# Patient Record
Sex: Female | Born: 1975
Health system: Southern US, Community
[De-identification: ages and names within clinical notes are randomized; demographics above are authoritative.]

## PROBLEM LIST (undated history)

## (undated) DIAGNOSIS — I499 Cardiac arrhythmia, unspecified: Secondary | ICD-10-CM

## (undated) DIAGNOSIS — Z992 Dependence on renal dialysis: Secondary | ICD-10-CM

## (undated) DIAGNOSIS — F32A Depression, unspecified: Secondary | ICD-10-CM

## (undated) DIAGNOSIS — K279 Peptic ulcer, site unspecified, unspecified as acute or chronic, without hemorrhage or perforation: Secondary | ICD-10-CM

## (undated) DIAGNOSIS — B373 Candidiasis of vulva and vagina: Secondary | ICD-10-CM

## (undated) DIAGNOSIS — E785 Hyperlipidemia, unspecified: Secondary | ICD-10-CM

## (undated) DIAGNOSIS — F29 Unspecified psychosis not due to a substance or known physiological condition: Secondary | ICD-10-CM

## (undated) DIAGNOSIS — K863 Pseudocyst of pancreas: Secondary | ICD-10-CM

## (undated) DIAGNOSIS — Z9889 Other specified postprocedural states: Secondary | ICD-10-CM

## (undated) DIAGNOSIS — G8929 Other chronic pain: Secondary | ICD-10-CM

## (undated) DIAGNOSIS — N907 Vulvar cyst: Secondary | ICD-10-CM

## (undated) DIAGNOSIS — N83209 Unspecified ovarian cyst, unspecified side: Secondary | ICD-10-CM

## (undated) DIAGNOSIS — F329 Major depressive disorder, single episode, unspecified: Secondary | ICD-10-CM

## (undated) DIAGNOSIS — F411 Generalized anxiety disorder: Secondary | ICD-10-CM

## (undated) DIAGNOSIS — F419 Anxiety disorder, unspecified: Secondary | ICD-10-CM

## (undated) DIAGNOSIS — M79606 Pain in leg, unspecified: Secondary | ICD-10-CM

## (undated) DIAGNOSIS — E669 Obesity, unspecified: Secondary | ICD-10-CM

## (undated) DIAGNOSIS — R109 Unspecified abdominal pain: Secondary | ICD-10-CM

## (undated) DIAGNOSIS — IMO0001 Reserved for inherently not codable concepts without codable children: Secondary | ICD-10-CM

## (undated) DIAGNOSIS — N189 Chronic kidney disease, unspecified: Secondary | ICD-10-CM

## (undated) DIAGNOSIS — K219 Gastro-esophageal reflux disease without esophagitis: Secondary | ICD-10-CM

## (undated) DIAGNOSIS — U071 COVID-19: Secondary | ICD-10-CM

## (undated) DIAGNOSIS — D631 Anemia in chronic kidney disease: Secondary | ICD-10-CM

## (undated) DIAGNOSIS — R319 Hematuria, unspecified: Secondary | ICD-10-CM

## (undated) DIAGNOSIS — I10 Essential (primary) hypertension: Secondary | ICD-10-CM

## (undated) DIAGNOSIS — Z8674 Personal history of sudden cardiac arrest: Secondary | ICD-10-CM

## (undated) DIAGNOSIS — N289 Disorder of kidney and ureter, unspecified: Secondary | ICD-10-CM

## (undated) DIAGNOSIS — N939 Abnormal uterine and vaginal bleeding, unspecified: Secondary | ICD-10-CM

## (undated) DIAGNOSIS — M109 Gout, unspecified: Secondary | ICD-10-CM

## (undated) DIAGNOSIS — R000 Tachycardia, unspecified: Secondary | ICD-10-CM

## (undated) DIAGNOSIS — E7800 Pure hypercholesterolemia, unspecified: Secondary | ICD-10-CM

## (undated) DIAGNOSIS — K859 Acute pancreatitis without necrosis or infection, unspecified: Secondary | ICD-10-CM

## (undated) HISTORY — DX: Peptic ulcer, site unspecified, unspecified as acute or chronic, without hemorrhage or perforation: K27.9

## (undated) HISTORY — DX: Anxiety disorder, unspecified: F41.9

## (undated) HISTORY — DX: Pain in leg, unspecified: M79.606

## (undated) HISTORY — DX: Depression, unspecified: F32.A

## (undated) HISTORY — PX: TUBAL LIGATION: SHX77

## (undated) HISTORY — DX: Other chronic pain: G89.29

## (undated) HISTORY — DX: Candidiasis of vulva and vagina: B37.3

## (undated) HISTORY — DX: Unspecified abdominal pain: R10.9

## (undated) HISTORY — DX: Other specified postprocedural states: Z98.890

## (undated) HISTORY — DX: Vulvar cyst: N90.7

## (undated) HISTORY — DX: Obesity, unspecified: E66.9

## (undated) HISTORY — DX: Reserved for inherently not codable concepts without codable children: IMO0001

## (undated) HISTORY — DX: Essential (primary) hypertension: I10

## (undated) HISTORY — DX: Hematuria, unspecified: R31.9

## (undated) HISTORY — DX: Major depressive disorder, single episode, unspecified: F32.9

## (undated) HISTORY — DX: Unspecified ovarian cyst, unspecified side: N83.209

## (undated) HISTORY — DX: Gastro-esophageal reflux disease without esophagitis: K21.9

## (undated) HISTORY — DX: Abnormal uterine and vaginal bleeding, unspecified: N93.9

---

## 2001-11-20 ENCOUNTER — Other Ambulatory Visit: Admission: RE | Admit: 2001-11-20 | Discharge: 2001-11-20 | Payer: Self-pay | Admitting: Obstetrics and Gynecology

## 2001-11-21 ENCOUNTER — Ambulatory Visit (HOSPITAL_COMMUNITY): Admission: RE | Admit: 2001-11-21 | Discharge: 2001-11-21 | Payer: Self-pay | Admitting: Obstetrics and Gynecology

## 2008-02-03 ENCOUNTER — Emergency Department (HOSPITAL_COMMUNITY): Admission: EM | Admit: 2008-02-03 | Discharge: 2008-02-03 | Payer: Self-pay | Admitting: Emergency Medicine

## 2008-03-18 ENCOUNTER — Emergency Department (HOSPITAL_COMMUNITY): Admission: EM | Admit: 2008-03-18 | Discharge: 2008-03-18 | Payer: Self-pay | Admitting: Emergency Medicine

## 2008-04-01 ENCOUNTER — Other Ambulatory Visit: Admission: RE | Admit: 2008-04-01 | Discharge: 2008-04-01 | Payer: Self-pay | Admitting: Obstetrics and Gynecology

## 2008-05-21 ENCOUNTER — Emergency Department (HOSPITAL_COMMUNITY): Admission: EM | Admit: 2008-05-21 | Discharge: 2008-05-21 | Payer: Self-pay | Admitting: Emergency Medicine

## 2008-09-13 ENCOUNTER — Emergency Department (HOSPITAL_COMMUNITY): Admission: EM | Admit: 2008-09-13 | Discharge: 2008-09-13 | Payer: Self-pay | Admitting: Emergency Medicine

## 2008-11-18 ENCOUNTER — Encounter: Payer: Self-pay | Admitting: Obstetrics and Gynecology

## 2008-11-18 ENCOUNTER — Ambulatory Visit (HOSPITAL_COMMUNITY): Admission: RE | Admit: 2008-11-18 | Discharge: 2008-11-18 | Payer: Self-pay | Admitting: Obstetrics and Gynecology

## 2009-05-07 ENCOUNTER — Emergency Department (HOSPITAL_COMMUNITY): Admission: EM | Admit: 2009-05-07 | Discharge: 2009-05-07 | Payer: Self-pay | Admitting: Emergency Medicine

## 2010-01-14 ENCOUNTER — Emergency Department (HOSPITAL_COMMUNITY): Admission: EM | Admit: 2010-01-14 | Discharge: 2010-01-15 | Payer: Self-pay | Admitting: Emergency Medicine

## 2010-07-22 ENCOUNTER — Other Ambulatory Visit: Admission: RE | Admit: 2010-07-22 | Discharge: 2010-07-22 | Payer: Self-pay | Admitting: Obstetrics and Gynecology

## 2010-09-28 DIAGNOSIS — IMO0002 Reserved for concepts with insufficient information to code with codable children: Secondary | ICD-10-CM | POA: Insufficient documentation

## 2010-10-02 ENCOUNTER — Ambulatory Visit: Payer: Self-pay | Admitting: Internal Medicine

## 2010-10-05 ENCOUNTER — Ambulatory Visit: Payer: Self-pay | Admitting: Internal Medicine

## 2010-10-05 DIAGNOSIS — R109 Unspecified abdominal pain: Secondary | ICD-10-CM | POA: Insufficient documentation

## 2010-10-22 ENCOUNTER — Ambulatory Visit (HOSPITAL_COMMUNITY): Admission: RE | Admit: 2010-10-22 | Discharge: 2010-10-22 | Payer: Self-pay | Admitting: Internal Medicine

## 2010-10-22 ENCOUNTER — Ambulatory Visit: Payer: Self-pay | Admitting: Internal Medicine

## 2010-10-27 ENCOUNTER — Ambulatory Visit (HOSPITAL_COMMUNITY): Admission: RE | Admit: 2010-10-27 | Discharge: 2010-10-27 | Payer: Self-pay | Admitting: Internal Medicine

## 2010-10-28 ENCOUNTER — Encounter (INDEPENDENT_AMBULATORY_CARE_PROVIDER_SITE_OTHER): Payer: Self-pay

## 2010-10-29 ENCOUNTER — Encounter: Payer: Self-pay | Admitting: Internal Medicine

## 2010-11-02 ENCOUNTER — Encounter (HOSPITAL_COMMUNITY)
Admission: RE | Admit: 2010-11-02 | Discharge: 2010-12-02 | Payer: Self-pay | Source: Home / Self Care | Admitting: Internal Medicine

## 2010-11-17 ENCOUNTER — Ambulatory Visit: Payer: Self-pay | Admitting: Internal Medicine

## 2011-01-17 ENCOUNTER — Encounter: Payer: Self-pay | Admitting: Obstetrics and Gynecology

## 2011-01-26 NOTE — Letter (Signed)
Summary: EGD ORDER  EGD ORDER   Imported By: Sofie Rower 10/02/2010 16:03:53  _____________________________________________________________________  External Attachment:    Type:   Image     Comment:   External Document

## 2011-01-26 NOTE — Assessment & Plan Note (Signed)
Summary: ABD PAIN/SS   Visit Type:  Initial Consult Referring Provider:  Luan Pulling Primary Care Provider:  Luan Pulling  Chief Complaint:  abd pain.  History of Present Illness: 35 year old morbidly obese African American female sent over out of the courtesy of  Dr. Luan Pulling to further evaluate insidiously  worsening abdominal pain. This lady has a history of peptic ulcer disease.  She has a history of peptic ulcer disease diagnosed at EGD about 10 yearts ago.  She was taking significant nonsteroidals at that time. Biopsies were negative for H. pylori/malignancy. Followup EGD demonstrated healing. Her CLOtest was also negative. HP serologies were also negative. More recently  family tells me she has been taking Goody powders but that behavior stopped about one month ago. She has not been taking any other nonsteroidals as for family knows. No dysphagia no odynophagia. She has a history of chronic GERD well-controlled on Prilosec 20 mg orally daily. She describes her pain  as epigastric  and occurs more often in the evening within several hours after eating before go into bed. Last an hour or so. Not associated with nausea or vomiting. She is rarely constipated. She denies melena or rectal bleeding. Father has a history of having a serrated adenoma removed from his colon prior to the age of 8. There is no family history of colon cancer. Her gallbladder remains in situ. She underwent  a tubal ligation since she was last seen here.   Current Medications (verified): 1)  Amlodipine Besy-Benazepril Hcl 10-20 Mg Caps (Amlodipine Besy-Benazepril Hcl) .... Take 1 Tablet By Mouth Once A Day 2)  Metoprolol Tartrate 100 Mg Tabs (Metoprolol Tartrate) .... Take 1 Tablet By Mouth Two Times A Day 3)  Hydrochlorothiazide 25 Mg Tabs (Hydrochlorothiazide) .... 1/2 Tablet Twice Daily 4)  Alprazolam 0.5 Mg Tabs (Alprazolam) .... Take 1 Tablet By Mouth Two Times A Day 5)  Prilosec 20 Mg Cpdr (Omeprazole) .... Take 1 Tablet By  Mouth Once A Day 6)  Lactulose Syrup .... As Directed As Needed 7)  Hydrocodone 5/325 Mg .... Take 1 Tablet By Mouth Four Times A Day As Needed  Allergies (verified): No Known Drug Allergies  Past History:  Family History: Last updated: 10/02/2010 Father: Living age 51   DM  hypertension Mother: Living age 6  Seizures/ hypertension Siblings: One brother   healthy  Social History: Last updated: 10/02/2010 Marital Status: Single Children: none Occupation: Disability  Past Medical History: Hypertension Anxiety and depression Leg pain Reflux  Past Surgical History: Hysterectomy  Family History: Father: Living age 73   DM  hypertension Mother: Living age 22  Seizures/ hypertension Siblings: One brother   healthy  Social History: Marital Status: Single Children: none Occupation: Disability  Review of Systems       patient denies yellow jaundice clay colored stools dark colored urine. There is no history of fever or chills. No change in weight recently.  Vital Signs:  Patient profile:   35 year old female Height:      65 inches Weight:      302 pounds BMI:     50.44 Temp:     97.9 degrees F oral Pulse rate:   80 / minute BP sitting:   140 / 92  (left arm) Cuff size:   regular  Vitals Entered By: Waldon Merl LPN (October  7, 624THL 3:17 PM)  Physical Exam  General:  pleasant obese 35 year old lady who is conversant and answers simple yes no questions. She's accompanied by  her  mother, father and caregiver Lungs:  clear to auscultation Heart:  regular rate and rhythm Abdomen:  obese [place] Sam she does have some epigastric tenderness to palpation no appreciable mass or organomegaly  Impression & Recommendations: Impression: 35 year old lady morbidlyobese mentally challenged lady referred for further evaluation of insidiously worsening epigastric pain of at least a couple years duration. This is in the setting of aspirin powder use. She has a history of NSAID  induced gastric ulcer disease previously. Her symptoms are a little peculiar. We  could be dealing with occult gallbladder disease. She has been on concomitant acid suppressing medication. She has well-controlled GERD by history. Family history of colon polyps at a young age.  Recommendations: Diagnostic EGD in the near future. Risks, benefits, limitations, alternatives and imponderables have been reviewed her questions have been answered. She and her family are agreeable.   In addition, we'll send her home with one stool kit for occult blood testing.  Assuming her stool is negative for occult blood, I recommend she have her first ever  screening colonoscopy at age 48. Further recommendations to follow. I want thank Dr. Luan Pulling for allowing me to see this nice lady once again.  Appended Document: Orders Update    Clinical Lists Changes  Orders: Added new Service order of New Patient Level IV [id number]) - Signed

## 2011-01-26 NOTE — Miscellaneous (Signed)
Summary: labs from [facility]  Clinical Lists Changes L-Lipase, Blood - STATUS: Final                                            Perform Date: 27Oct11 13:47  Ordered By: Gala Romney MD , Cristopher Estimable           Ordered Date: 27Oct11 13:48                                       Last Updated Date: 27Oct11 14:42  Facility: [facility]                               Department: GENL  Accession #: [id number]                  USN:       [id number]  Findings  Result Name                              Result     Abnl   Normal Range     Units      Perf. Loc.  Lipase                                          24                11-59            U/L  Additional Information  HL7 RESULT STATUS : F  External IF Update Timestamp : 2010-10-22:14:40:00.000000      L-CMP (Comprehensive Metabolic Pnl-CMET) - STATUS: Final                                            Perform Date: 27Oct11 13:47  Ordered By: Gala Romney MD , Cristopher Estimable           Ordered Date: 27Oct11 13:48                                       Last Updated Date: 27Oct11 14:42  Facility: [facility]                               Department: GENL  Accession #: [id number]                   USN:       [id number]  Findings  Result Name                              Result     Abnl   Normal Range     Units      Perf. Loc.  Sodium (NA)  139               135-145          mEq/L  Potassium (K)                            3.1        l      3.5-5.1          mEq/L  Chloride                                    102               96-112           mEq/L  CO2                                          30                19-32            mEq/L  Glucose                                    108        h      70-99            mg/dL  BUN                                           7                 6-23             mg/dL  Creatinine                                   0.95              0.4-1.2          mg/dL  GFR, Est Non African American            >60                >60              mL/min  GFR, Est African American                >60               >60              mL/min    Oversized comment, see footnote  1  Bilirubin, Total                             0.3               0.3-1.2          mg/dL  Alkaline Phosphatase  54                39-117           U/L  SGOT (AST)                               16                0-37             U/L  SGPT (ALT)                               15                0-35             U/L  Total  Protein                              6.8               6.0-8.3          g/dL  Albumin-Blood                            3.8               3.5-5.2          g/dL  Calcium                                      9.2               8.4-10.5         mg/dL  Footnotes  1. The eGFR has been calculated     using the MDRD equation.     This calculation has not been     validated in all clinical     situations.     eGFR's persistently     <60 mL/min signify     possible Chronic Kidney Disease.  Additional Information  HL7 RESULT STATUS : F  External IF Update Timestamp : 2010-10-22:14:40:00.000000     L-CBC-with Differential - STATUS: Final                                            Perform Date: 27Oct11 13:47  Ordered By: Gala Romney MD , Cristopher Estimable           Ordered Date: 27Oct11 13:47                                       Last Updated Date: 27Oct11 14:36  Facility: [facility]                               Department: GENL  Accession #: [id number]                  USN:       [id number]  Findings  Result Name  Result     Abnl   Normal Range     Units      Perf. Loc.  WBC                                           11.0       h      4.0-10.5         K/uL  RBC                                             4.24              3.87-5.11        MIL/uL  Hemoglobin (HGB)                         12.6              12.0-15.0        g/dL  Hematocrit (HCT)                           37.3               36.0-46.0        %  MCV                                            88.0              78.0-100.0       fL  MCH -                                          29.8              26.0-34.0        pg  MCHC                                          33.8              30.0-36.0        g/dL  RDW                                           14.8              11.5-15.5        %  Platelet Count (PLT)                       439        h      150-400          K/uL  Neutrophils, %  69                43-77            %  Lymphocytes, %                             25                12-46            %  Monocytes, %                                  5                 3-12             %  Eosinophils, %                                 1                 0-5              %  Basophils, %                                   0                 0-1              %  Neutrophils, Absolute                      7.5               1.7-7.7          K/uL  Lymphocytes, Absolute                   2.8               0.7-4.0          K/uL  Monocytes, Absolute                      0.5               0.1-1.0          K/uL  Eosinophils, Absolute                      0.1               0.0-0.7          K/uL  Basophils, Absolute                        0.0               0.0-0.1          K/uL  Additional Information  HL7 RESULT STATUS : F  External IF Update Timestamp : 2010-10-22:14:35:00.000000  Appended Document: labs from [facility] potassiuim 3.1; wbc minimally elevated; need KCL 20 meq daily x 7 days; proceed w HIDA w CCK to further evaluate abd pain  Appended Document: labs from [facility] Pt's aid, Tonya, was informed. Rx called to  [facility] @ [facility].  Appended Document: labs from [facility] Pt scheduled for 11/02/10@8 :00am..Pt aware of appt.

## 2011-01-26 NOTE — Assessment & Plan Note (Signed)
Summary: DROPPED OFF STOOL/SS   Allergies: No Known Drug Allergies  Other Orders: Immuno-chemical Fecal Occult ([id number])   Pt returned one iFOBT and it was negative.

## 2011-01-26 NOTE — Letter (Signed)
Summary: REFERRAL FROM [facility]  REFERRAL FROM [facility]   Imported By: Hoy Morn 10/05/2010 10:25:17  _____________________________________________________________________  External Attachment:    Type:   Image     Comment:   External Document

## 2011-01-26 NOTE — Letter (Signed)
Summary: hida scan order  hida scan order   Imported By: Sofie Rower 10/29/2010 10:51:58  _____________________________________________________________________  External Attachment:    Type:   Image     Comment:   External Document  Appended Document: hida scan order Precert obtained from [facility] for test and given to [facility] in [facility].  [phone]  Appended Document: 2 WK F/U PER RMR W/EXTENDER/LAW Discussed with Dr. Gala Romney. Will start low-dose Zoloft. Pt will need to follow-up with PCP for long-term management. I called and spoke personally both with patient and caretaker, patient's cousin. Pt denies any homicidal or suicidal thoughts. Caretaker and pt were instructed on signs, symptoms to watch for such as increased anxiety, worsening depression, suicidal thoughts. Both were informed would take 4-6 weeks for full affect. Cautioned against stopping abruptly; instructed would need to taper off dose. Caretaker stated understanding.   Appended Document: 2 WK F/U PER RMR W/EXTENDER/LAW 3-4 MONTH F/U OPV IS IN THE COMPUTER

## 2011-01-28 NOTE — Assessment & Plan Note (Signed)
Summary: 2 WK F/U PER RMR W/EXTENDER/LAW   Visit Type:  Follow-up Visit Primary Care Provider:  Luan Pulling  CC:  F/U.  History of Present Illness: Tamara [name] is a pleasant 35 year old female who is present with her cousin who is also her aide today.She has a history of chronic abdominal pain and has undergone a thorough evaluation. She does have a hx of PUD diagnosed by EGD approximately 10 years ago. She has undergone a CT in Jan of this year which showed no significant findings, an EGD in Oct 2011: normal esophagus, small hiatal hernia, single tiny antral erosion of doubtful clinical significance; otherwise normal stomach patent pylorus, normal D1 and D2. Ultrasound of abdomen demonstrating no stones, and a HIDA with a normal EF of 73%. She is on once a day prilosec for GERD, and this is well-controlled. Reports vague left-sided abdominal pain with onset "at night before bed". Described as "crampy" and intermittent. at times 10/10. No N/V, no melena or hematochezia. has bm daily, soft. no constipation. Does admit that she is somewhat down about a "guy problem". Aide reports anxiety/depression seem to contribute to onset/severity of symptoms.     Current Medications (verified): 1)  Amlodipine Besy-Benazepril Hcl 10-20 Mg Caps (Amlodipine Besy-Benazepril Hcl) .... Take 1 Tablet By Mouth Once A Day 2)  Metoprolol Tartrate 100 Mg Tabs (Metoprolol Tartrate) .... Take 1 Tablet By Mouth Two Times A Day 3)  Hydrochlorothiazide 25 Mg Tabs (Hydrochlorothiazide) .... 1/2 Tablet Twice Daily 4)  Alprazolam 0.5 Mg Tabs (Alprazolam) .... Take 1 Tablet By Mouth Two Times A Day 5)  Prilosec 20 Mg Cpdr (Omeprazole) .... Take 1 Tablet By Mouth Once A Day 6)  Lactulose Syrup .... As Directed As Needed 7)  Hydrocodone 5/325 Mg .... Take 1 Tablet By Mouth Four Times A Day As Needed  Allergies (verified): No Known Drug Allergies  Review of Systems General:  Denies fever, chills, and anorexia. Eyes:  Denies  blurring, irritation, and discharge. ENT:  Denies sore throat, hoarseness, and difficulty swallowing. CV:  Denies chest pains and syncope. Resp:  Denies dyspnea at rest and wheezing. GI:  Complains of abdominal pain; denies difficulty swallowing, pain on swallowing, nausea, indigestion/heartburn, vomiting, constipation, change in bowel habits, bloody BM's, and black BMs. GU:  Denies urinary burning, blood in urine, and urinary frequency. MS:  Denies joint pain / LOM, joint swelling, and joint stiffness. Derm:  Denies rash, itching, and dry skin. Neuro:  Denies weakness and syncope. Psych:  Complains of depression; denies anxiety. Endo:  Denies cold intolerance and heat intolerance.  Vital Signs:  Patient profile:   35 year old female Height:      65 inches (165.10 cm) Weight:      308 pounds (140 kg) BMI:     51.44 Temp:     98.1 degrees F (36.72 degrees C) oral Pulse rate:   60 / minute BP sitting:   160 / 98  (left arm) Cuff size:   large  Vitals Entered By: Waldon Merl LPN (November 22, 624THL 9:17 AM)  Physical Exam  General:  healthy appearing and obese.  pleasant, mentally challenged, answers questions appropriately.  Abdomen:  normal bowel sounds, obese, without guarding, without rebound, no distesion, no tenderness, and no hepatomegally or splenomegaly.   Msk:  Symmetrical with no gross deformities. Normal posture. Extremities:  No clubbing, cyanosis, edema or deformities noted. Neurologic:  Alert and  oriented x4;  grossly normal neurologically.  Impression & Recommendations:  Problem # 1:  ABDOMINAL PAIN (ICD-37.47)  35 year old pleasant young lady who has hx of left-sided chronic abdominal pain. has completed thorough work-up including CT in Jan 2011, upper endoscopy, [place] of abdomen, HIDA scan. no etiology to abdominal pain. Does report exacerbation of symptoms in relation to depression/anxiety. Functional abdominal pain likely r/t anxiety.   Continue Prilosec Will  discuss with Dr. Gala Romney potentially adding an SSRI or TCA in evening; however, pt already takes Xanax in morning and evening. Will review medications for least possible side effects. Discussed with pt and states understanding.  Return in 3-4 months for re-evaluation.   Orders: Est. Patient Level III [id number])  Appended Document: 2 WK F/U PER RMR W/EXTENDER/LAW Discussed with Dr. Gala Romney. Will start low-dose Zoloft. Pt will need to follow-up with PCP for long-term management. I called and spoke personally both with patient and caretaker, patient's cousin. Pt denies any homicidal or suicidal thoughts. Caretaker and pt were instructed on signs, symptoms to watch for such as increased anxiety, worsening depression, suicidal thoughts. Both were informed would take 4-6 weeks for full affect. Cautioned against stopping abruptly; instructed would need to taper off dose. Caretaker stated understanding.   Appended Document: 2 WK F/U PER RMR W/EXTENDER/LAW 3-4 MONTH F/U OPV IS IN THE COMPUTER

## 2011-02-23 ENCOUNTER — Ambulatory Visit: Payer: Self-pay | Admitting: Gastroenterology

## 2011-02-23 ENCOUNTER — Ambulatory Visit (INDEPENDENT_AMBULATORY_CARE_PROVIDER_SITE_OTHER): Payer: PRIVATE HEALTH INSURANCE | Admitting: Gastroenterology

## 2011-02-23 ENCOUNTER — Encounter: Payer: Self-pay | Admitting: Internal Medicine

## 2011-02-23 ENCOUNTER — Encounter: Payer: Self-pay | Admitting: Gastroenterology

## 2011-02-23 DIAGNOSIS — K625 Hemorrhage of anus and rectum: Secondary | ICD-10-CM

## 2011-02-23 DIAGNOSIS — R109 Unspecified abdominal pain: Secondary | ICD-10-CM

## 2011-03-03 ENCOUNTER — Ambulatory Visit (HOSPITAL_COMMUNITY)
Admission: RE | Admit: 2011-03-03 | Discharge: 2011-03-03 | Disposition: A | Discharge status: Home / Self Care | Payer: PRIVATE HEALTH INSURANCE | Source: Ambulatory Visit | Attending: Internal Medicine | Admitting: Internal Medicine

## 2011-03-03 ENCOUNTER — Encounter: Payer: Medicare Other | Admitting: Internal Medicine

## 2011-03-04 NOTE — Letter (Signed)
Summary: TCS ORDER  TCS ORDER   Imported By: Sofie Rower 02/23/2011 16:23:40  _____________________________________________________________________  External Attachment:    Type:   Image     Comment:   External Document

## 2011-03-04 NOTE — Assessment & Plan Note (Signed)
Summary: FU   Vital Signs:  Patient profile:   35 year old female Height:      62 inches Weight:      296 pounds BMI:     54.33 Temp:     99.1 degrees F oral Pulse rate:   76 / minute BP sitting:   130 / 88  (left arm)  Vitals Entered By: Loney Loh LPN (February 28, [id number] 2:12 PM)  Visit Type:  Follow-up Visit Primary Care Provider:  Luan Pulling   History of Present Illness: Ms. Hasting is a pleasant 35 year old female, with chronic abdominal pain a hx of PUD secondary to NSDAIDs in remote past. She is present with her mother today. She has had a thorough work-up of left-sided abdominal pain to include CT, [place], HIDA, and endoscopy. Please see last HPI for full account.  She was actually started on low-dose Zoloft after discussion with Dr. Gala Romney, as some of her symptoms were exacerbated by anxiety. There is a boy that she likes, who hasn't been coming around as often. She does admit to slight improvement overall since starting Zoloft, yet she continues to c/o constant left-sided discomfort.  Reports having a BM every evening. Has noted brb in toilet as well as on tissue with wiping over the past few weeks. Denies itching, painful rectum. No lack of appetite. She has lost 12 lbs since November, with a current wt of 296, prior was 308. She admits to trying to "watch her diet". She has never had a colonoscopy, an an ifobt last year was negative.   Current Medications (verified): 1)  Amlodipine Besy-Benazepril Hcl 10-20 Mg Caps (Amlodipine Besy-Benazepril Hcl) .... Take 1 Tablet By Mouth Once A Day 2)  Metoprolol Tartrate 100 Mg Tabs (Metoprolol Tartrate) .... Take 1 Tablet By Mouth Two Times A Day 3)  Hydrochlorothiazide 25 Mg Tabs (Hydrochlorothiazide) .... 1/2 Tablet Twice Daily 4)  Alprazolam 0.5 Mg Tabs (Alprazolam) .... Take 1 Tablet By Mouth Two Times A Day 5)  Prilosec 20 Mg Cpdr (Omeprazole) .... Take 1 Tablet By Mouth Once A Day 6)  Lactulose Syrup .... As Directed As Needed 7)   Hydrocodone 5/325 Mg .... Take 1 Tablet By Mouth Four Times A Day As Needed 8)  Zoloft 50 Mg Tabs (Sertraline Hcl) .... Take 1 By Mouth Daily. Do Not Stop Abruptly.  Allergies (verified): No Known Drug Allergies  Past History:  Past Medical History: Hypertension Anxiety and depression Leg pain Reflux PUD  Past Surgical History: Reviewed history from 10/05/2010 and no changes required. tubal ligation  Family History: Reviewed history from 10/02/2010 and no changes required. Father: Living age 82   DM  hypertension Mother: Living age 37  Seizures/ hypertension Siblings: One brother   healthy  Social History: Reviewed history from 10/02/2010 and no changes required. Marital Status: Single Children: none Occupation: Disability  Review of Systems General:  Denies fever, chills, and anorexia. Eyes:  Denies blurring, irritation, and discharge. ENT:  Denies sore throat, hoarseness, and difficulty swallowing. CV:  Denies chest pains and syncope. Resp:  Denies dyspnea at rest and wheezing. GI:  See HPI. GU:  Denies urinary burning and urinary frequency. MS:  Denies joint pain / LOM, joint swelling, and joint stiffness. Derm:  Denies rash, itching, and dry skin. Neuro:  Denies weakness and syncope. Psych:  Denies depression and anxiety. Endo:  Denies cold intolerance and heat intolerance.  Physical Exam  General:  Well developed, well nourished, no acute distress.obese.   Head:  Normocephalic  and atraumatic. Eyes:  PERRLA, no icterus. Lungs:  Clear throughout to auscultation. Heart:  Regular rate and rhythm; no murmurs, rubs,  or bruits. Abdomen:  +BS, obese, soft, non-distended, mildly tender to palpation LLQ. No HSM, no rebound or guarding Msk:  Symmetrical with no gross deformities. Normal posture. Extremities:  1+ pedal edema.   Neurologic:  Alert and  oriented x4;  grossly normal neurologically.   Impression & Recommendations:  Problem # 1:  RECTAL BLEEDING  (ICD-2.11)  35 year old female with new onset of brbpr for a few weeks. Denies constipation, has hx of chronic left-sided abdominal pain that has been fully evaluated by CT, EGD, [place], HIDA, and labs. Likely functional in nature, but due to new onset of hematochezia, will evaluate with colonoscopy. Likely due to benign anorectal source; this colonoscopy will be the final piece of a GI evaluation, and likely she will benefit from pain management in future if colonoscopy benign.  TCS with Dr. Gala Romney in near future: the R/B/A have been discussed in detail with pt and mother; stated understanding and ready to proceed Consider referral to pain management if negative findings  Orders: Est. Patient Level II [id number])  Problem # 2:  ABDOMINAL PAIN (ICD-789.00)  chronic abdominal pain, slightly improved with addition of low-dose zoloft daily (50mg ).   See # 1  Orders: Est. Patient Level II [id number])   Orders Added: 1)  Est. Patient Level II [id number]

## 2011-03-10 LAB — DIFFERENTIAL
Basophils Absolute: 0 10*3/uL (ref 0.0–0.1)
Basophils Relative: 0 % (ref 0–1)
Eosinophils Absolute: 0.1 10*3/uL (ref 0.0–0.7)
Eosinophils Relative: 1 % (ref 0–5)
Lymphocytes Relative: 25 % (ref 12–46)
Lymphs Abs: 2.8 10*3/uL (ref 0.7–4.0)
Monocytes Absolute: 0.5 10*3/uL (ref 0.1–1.0)
Monocytes Relative: 5 % (ref 3–12)
Neutro Abs: 7.5 10*3/uL (ref 1.7–7.7)
Neutrophils Relative %: 69 % (ref 43–77)

## 2011-03-10 LAB — CBC
HCT: 37.3 % (ref 36.0–46.0)
Hemoglobin: 12.6 g/dL (ref 12.0–15.0)
MCH: 29.8 pg (ref 26.0–34.0)
MCHC: 33.8 g/dL (ref 30.0–36.0)
MCV: 88 fL (ref 78.0–100.0)
Platelets: 439 10*3/uL — ABNORMAL HIGH (ref 150–400)
RBC: 4.24 MIL/uL (ref 3.87–5.11)
RDW: 14.8 % (ref 11.5–15.5)
WBC: 11 10*3/uL — ABNORMAL HIGH (ref 4.0–10.5)

## 2011-03-10 LAB — COMPREHENSIVE METABOLIC PANEL
ALT: 15 U/L (ref 0–35)
AST: 16 U/L (ref 0–37)
Albumin: 3.8 g/dL (ref 3.5–5.2)
Alkaline Phosphatase: 54 U/L (ref 39–117)
BUN: 7 mg/dL (ref 6–23)
CO2: 30 mEq/L (ref 19–32)
Calcium: 9.2 mg/dL (ref 8.4–10.5)
Chloride: 102 mEq/L (ref 96–112)
Creatinine, Ser: 0.95 mg/dL (ref 0.4–1.2)
GFR calc Af Amer: >60 mL/min (ref >60)
GFR calc non Af Amer: >60 mL/min (ref >60)
Glucose, Bld: 108 mg/dL — ABNORMAL HIGH (ref 70–99)
Potassium: 3.1 mEq/L — ABNORMAL LOW (ref 3.5–5.1)
Sodium: 139 mEq/L (ref 135–145)
Total Bilirubin: 0.3 mg/dL (ref 0.3–1.2)
Total Protein: 6.8 g/dL (ref 6.0–8.3)

## 2011-03-10 LAB — LIPASE, BLOOD: Lipase: 24 U/L (ref 11–59)

## 2011-03-14 LAB — DIFFERENTIAL
Basophils Absolute: 0 10*3/uL (ref 0.0–0.1)
Basophils Relative: 0 % (ref 0–1)
Eosinophils Absolute: 0.2 10*3/uL (ref 0.0–0.7)
Eosinophils Relative: 2 % (ref 0–5)
Lymphocytes Relative: 34 % (ref 12–46)
Lymphs Abs: 3.6 10*3/uL (ref 0.7–4.0)
Monocytes Absolute: 0.6 10*3/uL (ref 0.1–1.0)
Monocytes Relative: 6 % (ref 3–12)
Neutro Abs: 6.1 10*3/uL (ref 1.7–7.7)
Neutrophils Relative %: 58 % (ref 43–77)

## 2011-03-14 LAB — URINE MICROSCOPIC-ADD ON

## 2011-03-14 LAB — CBC
HCT: 35.2 % — ABNORMAL LOW (ref 36.0–46.0)
Hemoglobin: 12.1 g/dL (ref 12.0–15.0)
MCHC: 34.2 g/dL (ref 30.0–36.0)
MCV: 87.8 fL (ref 78.0–100.0)
Platelets: 414 K/uL — ABNORMAL HIGH (ref 150–400)
RBC: 4.01 MIL/uL (ref 3.87–5.11)
RDW: 14.5 % (ref 11.5–15.5)
WBC: 10.6 K/uL — ABNORMAL HIGH (ref 4.0–10.5)

## 2011-03-14 LAB — COMPREHENSIVE METABOLIC PANEL WITH GFR
ALT: 18 U/L (ref 0–35)
AST: 15 U/L (ref 0–37)
Albumin: 3.7 g/dL (ref 3.5–5.2)
Alkaline Phosphatase: 60 U/L (ref 39–117)
BUN: 9 mg/dL (ref 6–23)
CO2: 29 meq/L (ref 19–32)
Calcium: 9.1 mg/dL (ref 8.4–10.5)
Chloride: 102 meq/L (ref 96–112)
Creatinine, Ser: 0.96 mg/dL (ref 0.4–1.2)
GFR calc non Af Amer: >60 mL/min
Glucose, Bld: 85 mg/dL (ref 70–99)
Potassium: 2.9 meq/L — ABNORMAL LOW (ref 3.5–5.1)
Sodium: 140 meq/L (ref 135–145)
Total Bilirubin: 0.6 mg/dL (ref 0.3–1.2)
Total Protein: 6.5 g/dL (ref 6.0–8.3)

## 2011-03-14 LAB — URINALYSIS, ROUTINE W REFLEX MICROSCOPIC
Bilirubin Urine: NEGATIVE
Glucose, UA: NEGATIVE mg/dL
Ketones, ur: NEGATIVE mg/dL
Leukocytes, UA: NEGATIVE
Nitrite: NEGATIVE
Protein, ur: NEGATIVE mg/dL
Specific Gravity, Urine: 1.02 (ref 1.005–1.030)
Urobilinogen, UA: 0.2 mg/dL (ref 0.0–1.0)
pH: 6.5 (ref 5.0–8.0)

## 2011-03-14 LAB — PREGNANCY, URINE: Preg Test, Ur: NEGATIVE

## 2011-03-14 LAB — LIPASE, BLOOD: Lipase: 21 U/L (ref 11–59)

## 2011-03-15 ENCOUNTER — Ambulatory Visit (HOSPITAL_COMMUNITY)
Admission: RE | Admit: 2011-03-15 | Discharge: 2011-03-15 | Disposition: A | Discharge status: Home / Self Care | Payer: PRIVATE HEALTH INSURANCE | Source: Ambulatory Visit | Attending: Internal Medicine | Admitting: Internal Medicine

## 2011-03-15 ENCOUNTER — Encounter: Payer: PRIVATE HEALTH INSURANCE | Admitting: Internal Medicine

## 2011-03-15 DIAGNOSIS — R1032 Left lower quadrant pain: Secondary | ICD-10-CM | POA: Insufficient documentation

## 2011-03-15 DIAGNOSIS — K625 Hemorrhage of anus and rectum: Secondary | ICD-10-CM

## 2011-03-15 DIAGNOSIS — Z9889 Other specified postprocedural states: Secondary | ICD-10-CM

## 2011-03-15 DIAGNOSIS — Z79899 Other long term (current) drug therapy: Secondary | ICD-10-CM | POA: Insufficient documentation

## 2011-03-15 DIAGNOSIS — I10 Essential (primary) hypertension: Secondary | ICD-10-CM | POA: Insufficient documentation

## 2011-03-15 HISTORY — DX: Other specified postprocedural states: Z98.890

## 2011-03-23 NOTE — Op Note (Signed)
  NAME:  Tamara [name], Tamara [name]                 ACCOUNT NO.:  [id number]  MEDICAL RECORD NO.:  [id number]           PATIENT TYPE:  O  LOCATION:  DAYP                          FACILITY:  [facility]  PHYSICIAN:  R. Garfield Cornea, M.D. DATE OF BIRTH:  1976-08-08  DATE OF PROCEDURE:  03/15/2011 DATE OF DISCHARGE:                              OPERATIVE REPORT   INDICATIONS FOR PROCEDURE:  A 35 year old lady with new onset intermittent blood per rectum and denies change in bowel habits, some left-sided abdominal pain evaluated previously with CT and EGD. Colonoscopy is now being done.  Risks, benefits, limitations, alternatives, and imponderables have been discussed, questions answered. Please see the documentation in the medical record.  PROCEDURE NOTE:  O2 saturation, blood pressure, pulse, and respirations were monitored throughout the entire procedure.  CONSCIOUS SEDATION:  Versed 5 mg IV, Demerol 100 mg IV in divided doses.  INSTRUMENT:  Pentax video chip system.  FINDINGS:  Digital rectal exam revealed no abnormalities.  Endoscopic findings:  Prep was good.  Colon:  Colonic mucosa was surveyed from the rectosigmoid junction through the left transverse right colon to the appendiceal orifice, ileocecal valve/cecum.  These structures were well seen and photographed for the record.  Terminal ileum was intubated to 10 cm from this level.  The scope was slowly and cautiously withdrawn. All previously mentioned mucosal surfaces were again seen.  The colonic mucosa appeared normal as did terminal ileal mucosa.  The scope was pulled down into the rectum where a thorough examination of rectal mucosa including retroflexed view of the anal verge, en face view of the anal canal demonstrated minimally friable anal canal tissue, otherwise examination of the rectum revealed no abnormalities.  The patient tolerated the procedure well.  Cecal withdrawal time 6 minutes.  IMPRESSION:  Friable anal canal, otherwise  normal rectum:  Terminal ileum with suspect benign anorectal bleeding.  RECOMMENDATIONS: 1. Anusol-HC suppositories 1 per rectum bedtime times 10 days. 2. Benefiber 1 tablespoon daily. 3. Align probiotic 1 capsule daily. 4. May use lactulose if needed for constipation. 5. Office visit with [place] in 3 months to assess her progress.     Bridgette Habermann, M.D.     RMR/MEDQ  D:  03/15/2011  T:  03/15/2011  Job:  [id number]  cc:   Percell Miller L. Luan Pulling, M.D. [phone]  Electronically Signed by Jannette Spanner M.D. on 03/23/2011 11:23:00 AM

## 2011-04-06 LAB — URINALYSIS, ROUTINE W REFLEX MICROSCOPIC
Bilirubin Urine: NEGATIVE
Glucose, UA: NEGATIVE mg/dL
Hgb urine dipstick: NEGATIVE
Ketones, ur: NEGATIVE mg/dL
Nitrite: NEGATIVE
Protein, ur: NEGATIVE mg/dL
Specific Gravity, Urine: 1.01 (ref 1.005–1.030)
Urobilinogen, UA: 0.2 mg/dL (ref 0.0–1.0)
pH: 6.5 (ref 5.0–8.0)

## 2011-04-06 LAB — PREGNANCY, URINE: Preg Test, Ur: NEGATIVE

## 2011-05-11 NOTE — Op Note (Signed)
NAMEYARELYN, HAITH                 ACCOUNT NO.:  [id number]   MEDICAL RECORD NO.:  [id number]          PATIENT TYPE:  AMB   LOCATION:  DAY                           FACILITY:  [facility]   PHYSICIAN:  [name], M.D. DATE OF BIRTH:  1976-04-25   DATE OF PROCEDURE:  11/18/2008  DATE OF DISCHARGE:                               OPERATIVE REPORT   PREOPERATIVE DIAGNOSES:  Right ovarian dermoid, desire for elective  permanent sterilization, morbid obesity, borderline mental function.   POSTOPERATIVE DIAGNOSES:  Right ovarian dermoid, desire for elective  permanent sterilization, morbid obesity, borderline mental function.   PROCEDURE:  Laparoscopic right salpingo-oophorectomy, left tubal  ligation with Falope ring.   SURGEON:  [name], MD   ASSISTANTKelton Pillar, CST   ANESTHESIA:  General.   COMPLICATIONS:  Technically challenging decompression of right ovarian  dermoid precluding right ovarian cystectomy.   DETAILS OF PROCEDURE:  The patient was taken to the operating room,  prepped and draped for combined abdominal and vaginal procedure with  Hulka tenaculum attached to the cervix and Foley catheter inserted.  An  infraumbilical vertical 1-cm skin incision was made as well as a  transverse suprapubic 1 cm incision.  Abdomen was palpated, and sacral  promontory was not prominent.  The patient's large panniculus was placed  on upward traction and Veress needle inserted carefully, and water  droplet technique identified intraperitoneal location.  Pneumoperitoneum  was initially attempted, and after 3 L of CO2, we introduced 5-mm  laparoscopic trocar under direct visualization through the umbilical  site.  There was some preperitoneal insufflation as well as  intraperitoneal insufflation.  This preperitoneal insufflation  disappeared during the course of the case.  The suprapubic trocar was  introduced 8 mm, without difficulty and attention directed to the  pelvis.  Photo #1  showed the right ovary which was enlarged and  fallopian tubes and uterus, after sterilization.  This was performed by  elevating the uterus anteriorly, identifying the left tube to its  fimbriated end, placing a mid segment knuckle of tube and the Falope  ring applier, and infiltrating the incarcerated knuckle of tube with  Marcaine solution for pain control.  The right tube was similarly  ligated with Falope ring.  Attention was then directed to the dermoid  cyst.  Initial efforts were made to aspirate the cyst.  We were able to  extract the tip of a hair confirming once again the diagnosis.  Unfortunately, the sebaceous material was so firm and viscous that we  could not extract, though two separate needle aspirators were utilized  to attempt to accomplish this after the first one was obstructed.  After  the second effort, we decided to try to peel off the dermoid cyst  intact.  The antimesenteric surface of the ovary was split.  The dermoid  was quite deep beneath normal tissue.  Efforts to peel off the dermoid  cyst were unsuccessful.  Two additional aspiration attempts were  performed, and we just could not get the fluid out.  Decision was made  to do a  salpingo-oophorectomy, as the ovary was becoming quite bloody in  our efforts to dissect out the cyst.  Harmonic scalpel was used to  carefully resect the ovary from the infundibulopelvic ligament staying  tightly adjacent to the ovary and staying well away from the area where  the ureter could be.  The tube and ovary were dissected free, and the  dark photo #3 shows specimen inside the abdominal cavity.  Throughout  this portion of the case, we had a 5-mm trocar in the right lower  quadrant.  The suprapubic trocar was converted to a 12-mm port, and the  Endocatch bag inserted into the abdominal cavity.  The tube and ovary  placed inside the Endocatch bag, and harmonic scalpel used again to open  the dermoid cyst to allow for better  decompression.  The specimen was  then extracted through the suprapubic site.  This required opening the  skin to a distance of approximately 3 cm.  Subcu fatty tissue bluntly  dissected, and the fascia opened transversely for a distance of 3-4 cm.  The bag then gently was teased through the opening in the midline and  the fascia closed with 0 Vicryl.  The pneumoperitoneum was  reestablished, and the pelvis inspected, irrigated, and confirmed as  hemostatic.  Specimen was then passed off the table.  After completion  of the procedure, some saline was left inside the abdomen.  The right  lower quadrant and umbilical sites closed with subcuticular 4-0 Dexon as  was the suprapubic 4-cm wide incision.  The patient tolerated the  procedure well, had Steri-Strips placed on all three sites, Band-Aids on  the suprapubic site, and went to recovery room in good condition.  Foley  catheters were removed.      [name], M.D.  Electronically Signed     JVF/MEDQ  D:  11/18/2008  T:  11/19/2008  Job:  [id number]   cc:   [facility]   Dr. Luan Pulling

## 2011-06-07 ENCOUNTER — Encounter: Payer: Self-pay | Admitting: Internal Medicine

## 2011-07-16 ENCOUNTER — Encounter: Payer: Self-pay | Admitting: Urgent Care

## 2011-07-16 ENCOUNTER — Ambulatory Visit (INDEPENDENT_AMBULATORY_CARE_PROVIDER_SITE_OTHER): Payer: PRIVATE HEALTH INSURANCE | Admitting: Urgent Care

## 2011-07-16 DIAGNOSIS — R109 Unspecified abdominal pain: Secondary | ICD-10-CM

## 2011-07-16 DIAGNOSIS — E669 Obesity, unspecified: Secondary | ICD-10-CM

## 2011-07-16 DIAGNOSIS — K625 Hemorrhage of anus and rectum: Secondary | ICD-10-CM

## 2011-07-16 NOTE — Patient Instructions (Signed)
Weight loss 1-2# per week Get walking every day--start slow & work up to 30 minutes most days per week Low fat low cholesterol diet Obesity Obesity is defined as having a Body Mass Index (BMI) of 30 or more. To calculate your BMI divide your weight in pounds by your height in inches squared and multiply that product by 703. Major illnesses resulting from long-term obesity include:  Stroke.   Heart disease.   Diabetes.   Many cancers.   Arthritis.  Obesity also complicates recovery from many other medical problems.  CAUSES  A history of obesity in your parents.   Thyroid hormone imbalance.   Environmental factors such as excess calorie intake and physical inactivity.  TREATMENT A healthy weight loss program includes:  A calorie restricted diet based on individual calorie needs.   Increased physical activity (exercise).  An exercise program is just as important as the right low calorie diet.  Weight-loss medicines should be used only under the supervision of your physician. These medicines help, but only if they are used with diet and exercise programs. Medicines can have side effects including nervousness, nausea, abdominal pain, diarrhea, headache, drowsiness, and depression.  An unhealthy weight loss program includes:  Fasting.   Fad diets.   Supplements and drugs.  These choices do not succeed in long-term weight control.  HOME CARE INSTRUCTIONS To help you make the needed dietary changes:   Keep a daily record of everything you eat. There are many free websites to help you with this. It may be helpful to measure your foods so you can determine if you are eating the correct portion sizes.   Use low-calorie cookbooks or take special cooking classes.   Avoid alcohol. Drink more water and drinks with no calories.   Take vitamins and supplements only as recommended by your caregiver.   Weight-loss support groups, Nurse, mental health, counselors, and stress reduction  education can also be very helpful.  PREVENTION Losing weight and keeping it off takes time, discipline, a healthy diet and regular exercise. Document Released: 01/20/2005 Document Re-Released: 01/02/2008 [facility] Patient Information 2011 [place].Low-Fat, Low-Saturated-Fat, Low-Cholesterol Diets Food Selection Guide BREADS, CEREALS, PASTA, RICE, DRIED PEAS, AND BEANS These products are high in carbohydrates and most are low in fat. Therefore, they can be increased in the diet as substitutes for fatty foods. They too, however, contain calories and should not be eaten in excess. Cereals can be eaten for snacks as well as for breakfast.  Include foods that contain fiber (fruits, vegetables, whole grains, and legumes). Research shows that fiber may lower blood cholesterol levels, especially the water-soluble fiber found in fruits, vegetables, oat products, and legumes. FRUITS AND VEGETABLES It is good to eat fruits and vegetables. Besides being sources of fiber, both are rich in vitamins and some minerals. They help you get the daily allowances of these nutrients. Fruits and vegetables can be used for snacks and desserts. MEATS Limit lean meat, chicken, [place], and fish to no more than 6 ounces per day. Beef, Pork, and Lamb  Use lean cuts of beef, pork, and lamb. Lean cuts include:   Extra-lean ground beef.   Arm roast.   Sirloin tip.   Center-cut ham.   Round steak.   Loin chops.   Rump roast.   Tenderloin.  Trim all fat off the outside of meats before cooking. It is not necessary to severely decrease the intake of red meat, but lean choices should be made. Lean meat is rich in protein  and contains a highly absorbable form of iron. Premenopausal women, in particular, should avoid reducing lean red meat because this could increase the risk for low red blood cells (iron-deficiency anemia). Processed Meats Processed meats, such as bacon, bologna, salami, sausage, and hot dogs  contain large quantities of fat, are not rich in valuable nutrients, and should not be eaten very often. Organ Meats The organ meats, such as liver, sweetbreads, kidneys, and brain are very rich in cholesterol. They should be limited. Chicken and [place] These are good sources of protein. The fat of poultry can be reduced by removing the skin and underlying fat layers before cooking. Chicken and [place] can be substituted for lean red meat in the diet. Poultry should not be fried or covered with high-fat sauces. Fish and Shellfish Fish is a good source of protein. Shellfish contain cholesterol, but they usually are low in saturated fatty acids. The preparation of fish is important. Like chicken and [place], they should not be fried or covered with high-fat sauces. EGGS Egg yolks often are hidden in cooked and processed foods. Egg whites contain no fat or cholesterol. They can be eaten often. Try 1 to 2 egg whites instead of whole eggs in recipes or use egg substitutes that do not contain yolk. MILK AND DAIRY PRODUCTS Use skim or 1% milk instead of 2% or whole milk. Decrease whole milk, natural, and processed cheeses. Use nonfat or low-fat (2%) cottage cheese or low-fat cheeses made from vegetable oils. Choose nonfat or low-fat (1 to 2%) yogurt. Experiment with evaporated skim milk in recipes that call for heavy cream. Substitute low-fat yogurt or low-fat cottage cheese for sour cream in dips and salad dressings. Have at least 2 servings of low-fat dairy products, such as 2 glasses of skim (or 1%) milk each day to help get your daily calcium intake. FATS AND OILS Reduce the total intake of fats, especially saturated fat. Butterfat, lard, and beef fats are high in saturated fat and cholesterol. These should be avoided as much as possible. Vegetable fats do not contain cholesterol, but certain vegetable fats, such as coconut oil, palm oil, and palm kernel oil are very high in saturated fats. These should be  limited. These fats are often used in bakery goods, processed foods, popcorn, oils, and nondairy creamers. Vegetable shortenings and some peanut butters contain hydrogenated oils, which are also saturated fats. Read the labels on these foods and check for saturated vegetable oils. Unsaturated vegetable oils and fats do not raise blood cholesterol. However, they should be limited because they are fats and are high in calories. Total fat should still be limited to 30% of your daily caloric intake. Desirable liquid vegetable oils are corn oil, cottonseed oil, olive oil, canola oil, safflower oil, soybean oil, and sunflower oil. Peanut oil is not as good, but small amounts are acceptable. Buy a heart-healthy tub margarine that has no partially hydrogenated oils in the ingredients. Mayonnaise and salad dressings often are made from unsaturated fats, but they should also be limited because of their high calorie and fat content. Seeds, nuts, peanut butter, olives, and avocados are high in fat, but the fat is mainly the unsaturated type. These foods should be limited mainly to avoid excess calories and fat. OTHER EATING TIPS Snacks  Most sweets should be limited as snacks. They tend to be rich in calories and fats, and their caloric content outweighs their nutritional value. Some good choices in snacks are graham crackers, melba toast, soda crackers, bagels (  no egg), English muffins, fruits, and vegetables. These snacks are preferable to snack crackers, [place] fries, and chips. Popcorn should be air-popped or cooked in small amounts of liquid vegetable oil. Desserts Eat fruit, low-fat yogurt, and fruit ices instead of pastries, cake, and cookies. Sherbet, angel food cake, gelatin dessert, frozen low-fat yogurt, or other frozen products that do not contain saturated fat (pure fruit juice bars, frozen ice pops) are also acceptable.  COOKING METHODS Choose those methods that use little or no fat. They  include:  Poaching.   Braising.   Steaming.   Grilling.   Baking.   Stir-frying.   Broiling.   Microwaving.  Foods can be cooked in a nonstick pan without added fat, or use a nonfat cooking spray in regular cookware. Limit fried foods and avoid frying in saturated fat. Add moisture to lean meats by using water, broth, cooking wines, and other nonfat or low-fat sauces along with the cooking methods mentioned above. Soups and stews should be chilled after cooking. The fat that forms on top after a few hours in the refrigerator should be skimmed off. When preparing meals, avoid using excess salt. Salt can contribute to raising blood pressure in some people. EATING AWAY FROM HOME Order entres, potatoes, and vegetables without sauces or butter. When meat exceeds the size of a deck of cards (3 to 4 ounces), the rest can be taken home for another meal. Choose vegetable or fruit salads and ask for low-calorie salad dressings to be served on the side. Use dressings sparingly. Limit high-fat toppings, such as bacon, crumbled eggs, cheese, sunflower seeds, and olives. Ask for heart-healthy tub margarine instead of butter. Document Released: 06/04/2002 Document Re-Released: 03/09/2010 [facility] Patient Information 2011 [place].

## 2011-07-16 NOTE — Assessment & Plan Note (Signed)
Due to friable anal canal.  Resolved.

## 2011-07-16 NOTE — Assessment & Plan Note (Signed)
Pt advised to increase physical activity Wt loss goal 1-2# PER WEEK Low fat low chol diet Caregiver informed & given handouts

## 2011-07-16 NOTE — Progress Notes (Signed)
Referring Provider: Alonza Bogus, MD Primary Care Physician:  Alonza Bogus, MD Primary Gastroenterologist:  Dr. Gala Romney  Chief Complaint  Patient presents with  . Follow-up    chronic abd pain    HPI:  Tamara [name] is a 35 y.o. female here for follow up for chronic abdominal pain.  Pt presents w/ her caregiver today.  States she is doing quite well.   Denies any upper GI symptoms including heartburn, indigestion, nausea, vomiting, dysphagia, odynophagia or anorexia.   Denies any lower GI symptoms including constipation, diarrhea, rectal bleeding, melena or weight loss.  Concerned about wt gain.  Very sedentary lifestyle.  Past Medical History  Diagnosis Date  . Hypertension   . Anxiety and depression   . Leg pain   . Reflux   . PUD (peptic ulcer disease)   . Chronic abdominal pain   . S/P colonoscopy 03/15/11    friable anal canal    Past Surgical History  Procedure Date  . Tubal ligation     Current Outpatient Prescriptions  Medication Sig Dispense Refill  . ALPRAZolam (XANAX) 0.5 MG tablet Take 0.5 mg by mouth at bedtime as needed.        Marland Kitchen amLODipine-benazepril (LOTREL) 10-20 MG per capsule Take 1 capsule by mouth daily.        . hydrochlorothiazide 25 MG tablet Take 25 mg by mouth daily.        Marland Kitchen HYDROcodone-acetaminophen (NORCO) 5-325 MG per tablet Take 1 tablet by mouth every 6 (six) hours as needed.        . lactulose (CHRONULAC) 10 GM/15ML solution Take 20 g by mouth 3 (three) times daily.        . metoprolol (TOPROL-XL) 100 MG 24 hr tablet Take 100 mg by mouth daily.        Marland Kitchen omeprazole (PRILOSEC) 20 MG capsule Take 20 mg by mouth daily.        . sertraline (ZOLOFT) 50 MG tablet Take 50 mg by mouth daily.          Allergies as of 07/16/2011  . (No Known Allergies)    Family History: There is no known family history of colorectal carcinoma , liver disease, or inflammatory bowel disease.  Problem Relation Age of Onset  . Colon polyps Father   . Seizures  Mother   . Hypertension Mother   . Hypertension Father     History   Social History  . Marital Status: Single    Spouse Name: N/A    Number of Children: 0  . Years of Education: N/A   Occupational History  . diabled    Social History Main Topics  . Smoking status: Never Smoker   . Smokeless tobacco: Not on file  . Alcohol Use: No  . Drug Use: No  . Sexually Active: Not on file   Review of Systems: Gen: Denies any fever, chills, sweats, anorexia, fatigue, weakness, malaise, weight loss, and sleep disorder CV: Denies chest pain, angina, palpitations, syncope, orthopnea, PND, peripheral edema, and claudication. Resp: Denies dyspnea at rest, dyspnea with exercise, cough, sputum, wheezing, coughing up blood, and pleurisy. GI: Denies vomiting blood, jaundice, and fecal incontinence.   Denies dysphagia or odynophagia. Derm: Denies rash, itching, dry skin, hives, moles, warts, or unhealing ulcers.  Psych: Denies depression, anxiety, memory loss, suicidal ideation, hallucinations, paranoia, and confusion. Heme: Denies bruising, bleeding, and enlarged lymph nodes.  Physical Exam: BP 139/94  Pulse 79  Temp(Src) 97.5 F (36.4 C) (Temporal)  Ht 5\' 3"  (1.6 m)  Wt 300 lb (136.079 kg)  BMI 53.14 kg/m2 General:   Alert,  Well-developed, obese, pleasant and cooperative in NAD Head:  Normocephalic and atraumatic. Eyes:  Sclera clear, no icterus.   Conjunctiva pink. Mouth:  No deformity or lesions, dentition normal. Neck:  Supple; no masses or thyromegaly. Heart:  Regular rate and rhythm; no murmurs, clicks, rubs,  or gallops. Abdomen:  Soft, obese, nontender and nondistended. No masses, hepatosplenomegaly or hernias noted. Normal bowel sounds, without guarding, and without rebound.  Exam limited given pt's body habitus. Msk:  Symmetrical without gross deformities. Normal posture. Pulses:  Normal pulses noted. Extremities:  Without clubbing or edema. Neurologic:  Alert and  oriented x4;   grossly normal neurologically. Skin:  Intact without significant lesions or rashes. Cervical Nodes:  No significant cervical adenopathy. Psych:  Alert and cooperative. Normal mood and affect.

## 2011-07-16 NOTE — Assessment & Plan Note (Signed)
Resolved

## 2011-07-19 ENCOUNTER — Other Ambulatory Visit (HOSPITAL_COMMUNITY): Payer: Self-pay | Admitting: Pulmonary Disease

## 2011-07-19 ENCOUNTER — Ambulatory Visit (HOSPITAL_COMMUNITY)
Admission: RE | Admit: 2011-07-19 | Discharge: 2011-07-19 | Disposition: A | Discharge status: Home / Self Care | Payer: PRIVATE HEALTH INSURANCE | Source: Ambulatory Visit | Attending: Pulmonary Disease | Admitting: Pulmonary Disease

## 2011-07-19 DIAGNOSIS — M25579 Pain in unspecified ankle and joints of unspecified foot: Secondary | ICD-10-CM | POA: Insufficient documentation

## 2011-07-19 DIAGNOSIS — M79672 Pain in left foot: Secondary | ICD-10-CM

## 2011-07-19 NOTE — Progress Notes (Signed)
Cc to PCP 

## 2011-07-28 ENCOUNTER — Other Ambulatory Visit: Payer: Self-pay | Admitting: Adult Health

## 2011-07-28 ENCOUNTER — Other Ambulatory Visit (HOSPITAL_COMMUNITY)
Admission: RE | Admit: 2011-07-28 | Discharge: 2011-07-28 | Disposition: A | Discharge status: Home / Self Care | Payer: PRIVATE HEALTH INSURANCE | Source: Ambulatory Visit | Attending: Obstetrics and Gynecology | Admitting: Obstetrics and Gynecology

## 2011-07-28 DIAGNOSIS — Z01419 Encounter for gynecological examination (general) (routine) without abnormal findings: Secondary | ICD-10-CM | POA: Insufficient documentation

## 2011-08-21 ENCOUNTER — Emergency Department (HOSPITAL_COMMUNITY)
Admission: EM | Admit: 2011-08-21 | Discharge: 2011-08-21 | Disposition: A | Discharge status: Home / Self Care | Payer: PRIVATE HEALTH INSURANCE | Attending: Emergency Medicine | Admitting: Emergency Medicine

## 2011-08-21 ENCOUNTER — Encounter (HOSPITAL_COMMUNITY): Payer: Self-pay | Admitting: Emergency Medicine

## 2011-08-21 DIAGNOSIS — K219 Gastro-esophageal reflux disease without esophagitis: Secondary | ICD-10-CM | POA: Insufficient documentation

## 2011-08-21 DIAGNOSIS — R109 Unspecified abdominal pain: Secondary | ICD-10-CM | POA: Insufficient documentation

## 2011-08-21 DIAGNOSIS — F341 Dysthymic disorder: Secondary | ICD-10-CM | POA: Insufficient documentation

## 2011-08-21 DIAGNOSIS — R21 Rash and other nonspecific skin eruption: Secondary | ICD-10-CM

## 2011-08-21 DIAGNOSIS — I10 Essential (primary) hypertension: Secondary | ICD-10-CM | POA: Insufficient documentation

## 2011-08-21 DIAGNOSIS — Z9851 Tubal ligation status: Secondary | ICD-10-CM | POA: Insufficient documentation

## 2011-08-21 DIAGNOSIS — K279 Peptic ulcer, site unspecified, unspecified as acute or chronic, without hemorrhage or perforation: Secondary | ICD-10-CM | POA: Insufficient documentation

## 2011-08-21 MED ORDER — PREDNISONE 10 MG PO TABS: ORAL_TABLET | ORAL | Status: AC

## 2011-08-21 MED ORDER — DIPHENHYDRAMINE HCL 25 MG PO TABS: 25.0000 mg | ORAL_TABLET | Freq: Four times a day (QID) | ORAL | Status: AC | PRN

## 2011-08-21 MED ORDER — FAMOTIDINE 20 MG PO TABS
20.0000 mg | ORAL_TABLET | Freq: Once | ORAL | Status: AC
  Administered 2011-08-21: 20 mg via ORAL
  Filled 2011-08-21: qty 1

## 2011-08-21 MED ORDER — PREDNISONE 20 MG PO TABS
60.0000 mg | ORAL_TABLET | Freq: Once | ORAL | Status: AC
  Administered 2011-08-21: 60 mg via ORAL
  Filled 2011-08-21: qty 3

## 2011-08-21 MED ORDER — DIPHENHYDRAMINE HCL 25 MG PO CAPS
50.0000 mg | ORAL_CAPSULE | Freq: Once | ORAL | Status: AC
  Administered 2011-08-21: 50 mg via ORAL
  Filled 2011-08-21: qty 2

## 2011-08-21 NOTE — ED Provider Notes (Signed)
Medical screening examination/treatment/procedure(s) were performed by non-physician practitioner and as supervising physician I was immediately available for consultation/collaboration.   Charles B. Karle Starch, MD 08/21/11 1031

## 2011-08-21 NOTE — ED Provider Notes (Signed)
History     CSN: [id number] Arrival date & time: 08/21/2011  8:17 AM  Chief Complaint  Patient presents with  . Rash   HPI Comments: Patient c/o red rash "all over" for 2 days.  Also c/o itching.  Reports using a new soap and recently began taking hydrocodone for leg pain.  She denies swelling, difficulty swallowing or breathing, chest pain , shortness of breath.,  Has been using Cortisone cream w/o improvement  Patient is a 35 y.o. female presenting with rash. The history is provided by the patient.  Rash  This is a new problem. The current episode started 2 days ago. The problem has not changed since onset.The problem is associated with a new detergent/soap. There has been no fever. The rash is present on the torso, back, abdomen, neck, left upper leg, right upper leg, right lower leg, left arm, right arm and left lower leg. The patient is experiencing no pain. The pain has been constant since onset. Associated symptoms include itching. Pertinent negatives include no blisters, no pain and no weeping. She has tried anti-itch cream for the symptoms. The treatment provided no relief.    Past Medical History  Diagnosis Date  . Hypertension   . Anxiety and depression   . Leg pain   . Reflux   . PUD (peptic ulcer disease)   . Chronic abdominal pain   . S/P colonoscopy 03/15/11    friable anal canal    Past Surgical History  Procedure Date  . Tubal ligation     Family History  Problem Relation Age of Onset  . Colon polyps Father   . Seizures Mother   . Hypertension Mother   . Hypertension Father     History  Substance Use Topics  . Smoking status: Never Smoker   . Smokeless tobacco: Not on file  . Alcohol Use: No    OB History    Grav Para Term Preterm Abortions TAB SAB Ect Mult Living            0      Review of Systems  Constitutional: Negative for fever, activity change and appetite change.  Respiratory: Negative for chest tightness, shortness of breath and  wheezing.   Skin: Positive for itching and rash. Negative for pallor and wound.  Hematological: Does not bruise/bleed easily.  All other systems reviewed and are negative.    Physical Exam  BP 135/78  Pulse 102  Temp(Src) 98.9 F (37.2 C) (Oral)  Resp 20  Ht 5\' 6"  (1.676 m)  Wt 290 lb 6.4 oz (131.725 kg)  BMI 46.87 kg/m2  SpO2 97%  Physical Exam  Nursing note and vitals reviewed. Constitutional: She is oriented to person, place, and time. She appears well-developed and well-nourished. No distress.  HENT:  Head: Normocephalic and atraumatic.  Right Ear: External ear normal.  Left Ear: External ear normal.  Mouth/Throat: Oropharynx is clear and moist.  Eyes: EOM are normal. Pupils are equal, round, and reactive to light.  Neck: Normal range of motion. Neck supple. No thyromegaly present.  Cardiovascular: Normal rate, regular rhythm and normal heart sounds.   Pulmonary/Chest: Effort normal and breath sounds normal.  Abdominal: Soft. There is no tenderness.  Lymphadenopathy:    She has no cervical adenopathy.  Neurological: She is alert and oriented to person, place, and time. No cranial nerve deficit. She exhibits normal muscle tone. Coordination normal.  Skin: Skin is warm and dry. Rash noted. No petechiae and no purpura noted. Rash  is maculopapular. Rash is not nodular, not pustular, not vesicular and not urticarial.  Psychiatric: She has a normal mood and affect.    ED Course  Procedures  MDM   0830  Patient is alert, sitting on the stretcher, NAD.  Airway is clear, no edema.  Diffuse, confluent maculopapular rash to the entire body.  No angioedema, vesicles, pustules or petechiae   MEDICATIONS GIVEN IN THE ED:  Medications  diphenhydrAMINE (BENADRYL) capsule 50 mg (50 mg Oral Given 08/21/11 0842)  famotidine (PEPCID) tablet 20 mg (20 mg Oral Given 08/21/11 0842)  predniSONE (DELTASONE) tablet 60 mg (60 mg Oral Given 08/21/11 0841)     Filed Vitals:   08/21/11  0809  BP: 135/78  Pulse: 102  Temp: 98.9 F (37.2 C)  Resp: 20     1011  Patient is resting, has been observed in the ED w/o further complications or airway compromise.  Rash is likely related to norco or new soap.  I have advised pt to d/c both and to f/u with her PMD in 2-3 days for recheck or return to ED if sx's worsen.  Pt and her parents verbalized understanding      OUTPATIENT MEDICATIONS PRESCRIBED FROM THE ED:  New Prescriptions   DIPHENHYDRAMINE (BENADRYL) 25 MG TABLET    Take 1 tablet (25 mg total) by mouth every 6 (six) hours as needed for itching.   PREDNISONE (DELTASONE) 10 MG TABLET    Take 6 tablets day one, 5 tablets day two, 4 tablets day three, 3 tablets day four, 2 tablets day five, then 1 tablet day six     Pt feels improved after observation and/or treatment in ED.   The patient appears reasonably screened and/or stabilized for discharge and I doubt any other medical condition or other [facility] requiring further screening, evaluation, or treatment in the ED at this time prior to discharge.   Tamara [name] L. [place], [place] 08/21/11 1024

## 2011-08-21 NOTE — ED Notes (Signed)
Patient c/o rash on arms, stomach, and back x2 days. Reports itching. Denies any fevers. Per mother cortisone cream used with no relief.

## 2011-08-21 NOTE — ED Notes (Signed)
C/o rash x 2 days; diffuse red, raised rash noted to back, chest, abd, neck, and extremities; c/o itching with rash; reports using a new soap, and was seen by PCP and Rx "some cream" to apply, but states is not getting better. In no distress; denies pain or difficulty breathing/swallowing.

## 2011-09-17 LAB — POCT CARDIAC MARKERS
CKMB, poc: <1 — ABNORMAL LOW
Myoglobin, poc: 89.6
Operator id: 282261
Troponin i, poc: <0.05

## 2011-09-17 LAB — CBC
HCT: 39.3
Hemoglobin: 13.5
MCHC: 34.3
MCV: 87.5
Platelets: 441 — ABNORMAL HIGH
RBC: 4.5
RDW: 14.4
WBC: 8.2

## 2011-09-17 LAB — BASIC METABOLIC PANEL
BUN: 9
CO2: 25
Calcium: 9.1
Chloride: 107
Creatinine, Ser: 0.99
GFR calc Af Amer: >60
GFR calc non Af Amer: >60
Glucose, Bld: 102 — ABNORMAL HIGH
Potassium: 3.7
Sodium: 139

## 2011-09-17 LAB — DIFFERENTIAL
Basophils Absolute: 0
Basophils Relative: 1
Eosinophils Absolute: 0.1
Eosinophils Relative: 2
Lymphocytes Relative: 30
Lymphs Abs: 2.4
Monocytes Absolute: 0.8
Monocytes Relative: 9
Neutro Abs: 4.8
Neutrophils Relative %: 59

## 2011-09-17 LAB — D-DIMER, QUANTITATIVE (NOT AT ARMC): D-Dimer, Quant: 0.22

## 2011-09-22 LAB — BASIC METABOLIC PANEL
BUN: 7
CO2: 27
Calcium: 8.9
Chloride: 106
Creatinine, Ser: 1.03
GFR calc Af Amer: >60
GFR calc non Af Amer: >60
Glucose, Bld: 97
Potassium: 3.3 — ABNORMAL LOW
Sodium: 140

## 2011-09-22 LAB — CBC
HCT: 37
Hemoglobin: 12.7
MCHC: 34.4
MCV: 87.2
Platelets: 427 — ABNORMAL HIGH
RBC: 4.24
RDW: 14.8
WBC: 10.3

## 2011-09-22 LAB — URINALYSIS, ROUTINE W REFLEX MICROSCOPIC
Bilirubin Urine: NEGATIVE
Glucose, UA: NEGATIVE
Hgb urine dipstick: NEGATIVE
Ketones, ur: NEGATIVE
Nitrite: NEGATIVE
Protein, ur: NEGATIVE
Specific Gravity, Urine: 1.01
Urobilinogen, UA: 0.2
pH: 6.5

## 2011-09-22 LAB — PREGNANCY, URINE: Preg Test, Ur: NEGATIVE

## 2011-09-22 LAB — DIFFERENTIAL
Basophils Absolute: 0
Basophils Relative: 0
Eosinophils Absolute: 0.2
Eosinophils Relative: 2
Lymphocytes Relative: 42
Lymphs Abs: 4.3 — ABNORMAL HIGH
Monocytes Absolute: 0.6
Monocytes Relative: 6
Neutro Abs: 5.2
Neutrophils Relative %: 50

## 2011-09-22 LAB — URINE MICROSCOPIC-ADD ON

## 2011-09-22 LAB — POCT CARDIAC MARKERS
CKMB, poc: 1.1
Myoglobin, poc: 65.1
Operator id: 282261
Troponin i, poc: <0.05

## 2011-09-27 LAB — DIFFERENTIAL
Basophils Absolute: 0
Basophils Relative: 0
Eosinophils Absolute: 0.1
Eosinophils Relative: 1
Lymphocytes Relative: 30
Lymphs Abs: 2.9
Monocytes Absolute: 0.5
Monocytes Relative: 6
Neutro Abs: 6
Neutrophils Relative %: 63

## 2011-09-27 LAB — POCT CARDIAC MARKERS
CKMB, poc: <1 — ABNORMAL LOW
Myoglobin, poc: 75.8
Troponin i, poc: <0.05

## 2011-09-27 LAB — URINALYSIS, ROUTINE W REFLEX MICROSCOPIC
Bilirubin Urine: NEGATIVE
Glucose, UA: NEGATIVE
Ketones, ur: NEGATIVE
Nitrite: NEGATIVE
Protein, ur: NEGATIVE
Specific Gravity, Urine: 1.01
Urobilinogen, UA: 0.2
pH: 7

## 2011-09-27 LAB — BASIC METABOLIC PANEL
BUN: 9
CO2: 23
Calcium: 9.3
Chloride: 104
Creatinine, Ser: 1.18
GFR calc Af Amer: >60
GFR calc non Af Amer: 54 — ABNORMAL LOW
Glucose, Bld: 101 — ABNORMAL HIGH
Potassium: 3.3 — ABNORMAL LOW
Sodium: 140

## 2011-09-27 LAB — CBC
HCT: 38.3
Hemoglobin: 13.1
MCHC: 34.3
MCV: 87.6
Platelets: 474 — ABNORMAL HIGH
RBC: 4.37
RDW: 14.3
WBC: 9.5

## 2011-09-27 LAB — URINE MICROSCOPIC-ADD ON

## 2011-09-28 LAB — HCG, QUANTITATIVE, PREGNANCY: hCG, Beta Chain, Quant, S: <2

## 2011-09-28 LAB — LIPID PANEL
Cholesterol: 174
HDL: 36 — ABNORMAL LOW
LDL Cholesterol: 120 — ABNORMAL HIGH
Total CHOL/HDL Ratio: 4.8
Triglycerides: 91
VLDL: 18

## 2011-09-28 LAB — URINALYSIS, ROUTINE W REFLEX MICROSCOPIC
Bilirubin Urine: NEGATIVE
Glucose, UA: NEGATIVE
Hgb urine dipstick: NEGATIVE
Ketones, ur: NEGATIVE
Nitrite: NEGATIVE
Protein, ur: NEGATIVE
Specific Gravity, Urine: <1.005 — ABNORMAL LOW
Urobilinogen, UA: 0.2
pH: 6

## 2011-09-28 LAB — COMPREHENSIVE METABOLIC PANEL
ALT: 17
AST: 17
Albumin: 3.7
Alkaline Phosphatase: 80
BUN: 9
CO2: 27
Calcium: 9.4
Chloride: 105
Creatinine, Ser: 1
GFR calc Af Amer: >60
GFR calc non Af Amer: >60
Glucose, Bld: 82
Potassium: 3.7
Sodium: 138
Total Bilirubin: 0.5
Total Protein: 6.3

## 2011-09-28 LAB — CBC
HCT: 38.9
Hemoglobin: 13.3
MCHC: 34.3
MCV: 89.3
Platelets: 425 — ABNORMAL HIGH
RBC: 4.36
RDW: 14.9
WBC: 9

## 2011-09-28 LAB — URINE MICROSCOPIC-ADD ON

## 2013-09-19 ENCOUNTER — Ambulatory Visit (INDEPENDENT_AMBULATORY_CARE_PROVIDER_SITE_OTHER): Payer: PRIVATE HEALTH INSURANCE | Admitting: Adult Health

## 2013-09-19 ENCOUNTER — Other Ambulatory Visit (HOSPITAL_COMMUNITY)
Admission: RE | Admit: 2013-09-19 | Discharge: 2013-09-19 | Disposition: A | Discharge status: Home / Self Care | Payer: PRIVATE HEALTH INSURANCE | Source: Ambulatory Visit | Attending: Adult Health | Admitting: Adult Health

## 2013-09-19 ENCOUNTER — Encounter: Payer: Self-pay | Admitting: Adult Health

## 2013-09-19 VITALS — BP 150/92 | HR 76 | Ht 62.0 in | Wt 315.5 lb

## 2013-09-19 DIAGNOSIS — B3731 Acute candidiasis of vulva and vagina: Secondary | ICD-10-CM

## 2013-09-19 DIAGNOSIS — B373 Candidiasis of vulva and vagina: Secondary | ICD-10-CM

## 2013-09-19 DIAGNOSIS — Z01419 Encounter for gynecological examination (general) (routine) without abnormal findings: Secondary | ICD-10-CM

## 2013-09-19 DIAGNOSIS — E669 Obesity, unspecified: Secondary | ICD-10-CM

## 2013-09-19 DIAGNOSIS — Z1151 Encounter for screening for human papillomavirus (HPV): Secondary | ICD-10-CM | POA: Insufficient documentation

## 2013-09-19 HISTORY — DX: Acute candidiasis of vulva and vagina: B37.31

## 2013-09-19 HISTORY — DX: Candidiasis of vulva and vagina: B37.3

## 2013-09-19 MED ORDER — TERCONAZOLE 0.4 % VA CREA: 1.0000 | TOPICAL_CREAM | Freq: Every day | VAGINAL | Status: DC

## 2013-09-19 NOTE — Patient Instructions (Addendum)
Physical in 1 year  Mammogram at 41 Use terazol cream Monilial Vaginitis Vaginitis in a soreness, swelling and redness (inflammation) of the vagina and vulva. Monilial vaginitis is not a sexually transmitted infection. CAUSES  Yeast vaginitis is caused by yeast (candida) that is normally found in your vagina. With a yeast infection, the candida has overgrown in number to a point that upsets the chemical balance. SYMPTOMS   White, thick vaginal discharge.  Swelling, itching, redness and irritation of the vagina and possibly the lips of the vagina (vulva).  Burning or painful urination.  Painful intercourse. DIAGNOSIS  Things that may contribute to monilial vaginitis are:  Postmenopausal and virginal states.  Pregnancy.  Infections.  Being tired, sick or stressed, especially if you had monilial vaginitis in the past.  Diabetes. Good control will help lower the chance.  Birth control pills.  Tight fitting garments.  Using bubble bath, feminine sprays, douches or deodorant tampons.  Taking certain medications that kill germs (antibiotics).  Sporadic recurrence can occur if you become ill. TREATMENT  Your caregiver will give you medication.  There are several kinds of anti monilial vaginal creams and suppositories specific for monilial vaginitis. For recurrent yeast infections, use a suppository or cream in the vagina 2 times a week, or as directed.  Anti-monilial or steroid cream for the itching or irritation of the vulva may also be used. Get your caregiver's permission.  Painting the vagina with methylene blue solution may help if the monilial cream does not work.  Eating yogurt may help prevent monilial vaginitis. HOME CARE INSTRUCTIONS   Finish all medication as prescribed.  Do not have sex until treatment is completed or after your caregiver tells you it is okay.  Take warm sitz baths.  Do not douche.  Do not use tampons, especially scented ones.  Wear  cotton underwear.  Avoid tight pants and panty hose.  Tell your sexual partner that you have a yeast infection. They should go to their caregiver if they have symptoms such as mild rash or itching.  Your sexual partner should be treated as well if your infection is difficult to eliminate.  Practice safer sex. Use condoms.  Some vaginal medications cause latex condoms to fail. Vaginal medications that harm condoms are:  Cleocin cream.  Butoconazole (Femstat).  Terconazole (Terazol) vaginal suppository.  Miconazole (Monistat) (may be purchased over the counter). SEEK MEDICAL CARE IF:   You have a temperature by mouth above 102 F (38.9 C).  The infection is getting worse after 2 days of treatment.  The infection is not getting better after 3 days of treatment.  You develop blisters in or around your vagina.  You develop vaginal bleeding, and it is not your menstrual period.  You have pain when you urinate.  You develop intestinal problems.  You have pain with sexual intercourse. Document Released: 09/22/2005 Document Revised: 03/06/2012 Document Reviewed: 06/06/2009 [facility] Patient Information 2014 [place], [place]. Physical in 1 year

## 2013-09-19 NOTE — Progress Notes (Signed)
Patient ID: Tamara [name], female   DOB: 1976/11/09, 37 y.o.   MRN: [id number] History of Present Illness: Tamara [name] is a 37 year old black female single who lives with her parents, in for pap and physical.   Current Medications, Allergies, Past Medical History, Past Surgical History, Family History and Social History were reviewed in [place] record.     Review of Systems: Patient denies any daily headaches, blurred vision, shortness of breath, chest pain, abdominal pain, problems with bowel movements, urination, or intercourse. She has some vaginal itching and has bump on nose,No joint pain or mood swings.    Physical Exam:BP 150/92  Pulse 76  Ht 5\' 2"  (1.575 m)  Wt 315 lb 8 oz (143.11 kg)  BMI 57.69 kg/m2 General:  Well developed, well nourished, no acute distress Skin:  Warm and dry,bump on nose opened with sterile needle and blood expressed out Neck:  Midline trachea, normal thyroid Lungs; Clear to auscultation bilaterally Breast:  No dominant palpable mass, retraction, or nipple discharge Cardiovascular: Regular rate and rhythm Abdomen:  Soft, non tender, no hepatosplenomegaly,obese Pelvic:  External genitalia is normal in appearance, except she has numerous sebaceous cyst under the skin.  The vagina is normal in appearance, white discharge,clumpy. The cervix is nulliparous.Pap performed with HPV.  Uterus is felt to be normal size, shape, and contour.  No  adnexal masses or tenderness noted. Extremities:  No swelling or varicosities noted Psych:  Alert and cooperative seems happy, she is almost child like at times   Impression: Yearly gyn exam Yeast Obesity  Plan: Physical in 1 year Rx terazol 7 cream to use at hs x 7 nights Decrease sodas Review handout on yeast Follow up with Dr Luan Pulling as scheduled Mammogram at 37

## 2014-05-29 ENCOUNTER — Encounter (HOSPITAL_COMMUNITY)
Admission: RE | Admit: 2014-05-29 | Discharge: 2014-05-29 | Disposition: A | Discharge status: Home / Self Care | Payer: PRIVATE HEALTH INSURANCE | Source: Ambulatory Visit | Attending: Oral Surgery | Admitting: Oral Surgery

## 2014-05-29 ENCOUNTER — Encounter (HOSPITAL_COMMUNITY): Payer: Self-pay

## 2014-05-29 ENCOUNTER — Encounter (HOSPITAL_COMMUNITY)
Admission: RE | Admit: 2014-05-29 | Discharge: 2014-05-29 | Disposition: A | Discharge status: Home / Self Care | Payer: PRIVATE HEALTH INSURANCE | Source: Ambulatory Visit | Attending: Anesthesiology | Admitting: Anesthesiology

## 2014-05-29 DIAGNOSIS — I10 Essential (primary) hypertension: Secondary | ICD-10-CM | POA: Insufficient documentation

## 2014-05-29 DIAGNOSIS — Z01812 Encounter for preprocedural laboratory examination: Secondary | ICD-10-CM | POA: Diagnosis present

## 2014-05-29 DIAGNOSIS — Z01818 Encounter for other preprocedural examination: Secondary | ICD-10-CM | POA: Insufficient documentation

## 2014-05-29 DIAGNOSIS — Z01810 Encounter for preprocedural cardiovascular examination: Secondary | ICD-10-CM | POA: Diagnosis not present

## 2014-05-29 HISTORY — DX: Depression, unspecified: F32.A

## 2014-05-29 HISTORY — DX: Major depressive disorder, single episode, unspecified: F32.9

## 2014-05-29 LAB — CBC
HCT: 38.1 % (ref 36.0–46.0)
Hemoglobin: 13.4 g/dL (ref 12.0–15.0)
MCH: 31.5 pg (ref 26.0–34.0)
MCHC: 35.2 g/dL (ref 30.0–36.0)
MCV: 89.6 fL (ref 78.0–100.0)
Platelets: 384 10*3/uL (ref 150–400)
RBC: 4.25 MIL/uL (ref 3.87–5.11)
RDW: 13.7 % (ref 11.5–15.5)
WBC: 10.5 10*3/uL (ref 4.0–10.5)

## 2014-05-29 LAB — BASIC METABOLIC PANEL
BUN: 11 mg/dL (ref 6–23)
CO2: 25 mEq/L (ref 19–32)
Calcium: 9.3 mg/dL (ref 8.4–10.5)
Chloride: 103 mEq/L (ref 96–112)
Creatinine, Ser: 0.83 mg/dL (ref 0.50–1.10)
GFR calc Af Amer: >90 mL/min (ref >90)
GFR calc non Af Amer: 90 mL/min — ABNORMAL LOW (ref >90)
Glucose, Bld: 89 mg/dL (ref 70–99)
Potassium: 3.6 mEq/L — ABNORMAL LOW (ref 3.7–5.3)
Sodium: 140 mEq/L (ref 137–147)

## 2014-05-29 LAB — HCG, SERUM, QUALITATIVE: Preg, Serum: NEGATIVE

## 2014-05-29 NOTE — Progress Notes (Signed)
05/29/14 1137  [place]  Have you ever been diagnosed with sleep apnea through a sleep study? No  Do you snore loudly (loud enough to be heard through closed doors)?  1  Do you often feel tired, fatigued, or sleepy during the daytime? 0  Has anyone observed you stop breathing during your sleep? 0  Do you have, or are you being treated for high blood pressure? 1  BMI more than 35 kg/m2? 1  Age over 37 years old? 0  Neck circumference greater than 40 cm/16 inches? 1 (21)  Gender: 0  Obstructive Sleep Apnea Score 4  Score 4 or greater  Results sent to PCP

## 2014-05-29 NOTE — Progress Notes (Signed)
Parents here with patient. Neither parents nor patient completely clear on medical history at times.

## 2014-05-29 NOTE — Pre-Procedure Instructions (Addendum)
Tamara Tamara [name]  05/29/2014   Your procedure is scheduled on:  06/03/14  Report to [facility] short stay admitting at 645 AM.  Call this number if you have problems the morning of surgery: [phone]   Remember:   Do not eat food or drink liquids after midnight.   Take these medicines the morning of surgery with A SIP OF WATER: xanax.pain med if needed,,colcrys,metoprolol, zoloft, omeprazole      Take all med until day of surgery except as instructed below or per dr     Tamara Tamara [name] all herbel meds, nsaids (aleve,naproxen,advil,ibuprofen) today including vitamins, aspirin   Do not wear jewelry, make-up or nail polish.  Do not wear lotions, powders, or perfumes. You may wear deodorant.  Do not shave 48 hours prior to surgery. Tamara [name] may shave face and neck.  Do not bring valuables to the hospital.  [facility] is not responsible                  for any belongings or valuables.               Contacts, dentures or bridgework may not be worn into surgery.  Leave suitcase in the car. After surgery it may be brought to your room.  For patients admitted to the hospital, discharge time is determined by your                treatment team.               Patients discharged the day of surgery will not be allowed to drive  home.  Name and phone number of your driver:   Special Instructions:  Special Instructions: Tamara [name] - Preparing for Surgery  Before surgery, you can play an important role.  Because skin is not sterile, your skin needs to be as free of germs as possible.  You can reduce the number of germs on you skin by washing with CHG (chlorahexidine gluconate) soap before surgery.  CHG is an antiseptic cleaner which kills germs and bonds with the skin to continue killing germs even after washing.  Please DO NOT use if you have an allergy to CHG or antibacterial soaps.  If your skin becomes reddened/irritated stop using the CHG and inform your nurse when you arrive at Short Stay.  Do not shave  (including legs and underarms) for at least 48 hours prior to the first CHG shower.  You may shave your face.  Please follow these instructions carefully:   1.  Shower with CHG Soap the night before surgery and the morning of Surgery.  2.  If you choose to wash your hair, wash your hair first as usual with your normal shampoo.  3.  After you shampoo, rinse your hair and body thoroughly to remove the Shampoo.  4.  Use CHG as you would any other liquid soap.  You can apply chg directly  to the skin and wash gently with scrungie or a clean washcloth.  5.  Apply the CHG Soap to your body ONLY FROM THE NECK DOWN.  Do not use on open wounds or open sores.  Avoid contact with your eyes ears, mouth and genitals (private parts).  Wash genitals (private parts)       with your normal soap.  6.  Wash thoroughly, paying special attention to the area where your surgery will be performed.  7.  Thoroughly rinse your body with warm water from the neck down.  8.  DO NOT shower/wash with your normal soap after using and rinsing off the CHG Soap.  9.  Pat yourself dry with a clean towel.            10.  Wear clean pajamas.            11.  Place clean sheets on your bed the night of your first shower and do not sleep with pets.  Day of Surgery  Do not apply any lotions/deodorants the morning of surgery.  Please wear clean clothes to the hospital/surgery center.   Please read over the following fact sheets that you were given: Pain Booklet, Coughing and Deep Breathing and Surgical Site Infection Prevention

## 2014-05-29 NOTE — H&P (Signed)
HISTORY AND PHYSICAL  Tamara [name] is a 38 y.o. female patient with [id number] wisdom tooth.  No diagnosis found.  Past Medical History  Diagnosis Date  . Hypertension   . Anxiety and depression   . Leg pain   . Reflux   . PUD (peptic ulcer disease)   . Chronic abdominal pain   . S/P colonoscopy 03/15/11    friable anal canal  . Obesity   . Yeast vaginitis 09/19/2013  . Depression     No current facility-administered medications for this encounter.   Current Outpatient Prescriptions  Medication Sig Dispense Refill  . allopurinol (ZYLOPRIM) 300 MG tablet Take 300 mg by mouth daily.       Marland Kitchen ALPRAZolam (XANAX) 0.5 MG tablet Take 0.5 mg by mouth 3 (three) times daily as needed. Anxiety      . amLODipine-benazepril (LOTREL) 10-20 MG per capsule Take 1 capsule by mouth daily.        Marland Kitchen COLCRYS 0.6 MG tablet Take 0.6 mg by mouth daily.       Marland Kitchen doxycycline (VIBRA-TABS) 100 MG tablet Take 100 mg by mouth daily.       . DULoxetine (CYMBALTA) 60 MG capsule Take 60 mg by mouth at bedtime.        . furosemide (LASIX) 40 MG tablet Take 40 mg by mouth daily.       . hydrochlorothiazide (,MICROZIDE/HYDRODIURIL,) 12.5 MG capsule Take 12.5 mg by mouth 2 (two) times daily.        . hydrochlorothiazide 25 MG tablet Take 25 mg by mouth daily.        Marland Kitchen HYDROcodone-acetaminophen (NORCO) 5-325 MG per tablet Take 1 tablet by mouth every 6 (six) hours as needed. Pain      . lactulose (CHRONULAC) 10 GM/15ML solution Take 20 g by mouth daily.       . metoprolol (TOPROL-XL) 100 MG 24 hr tablet Take 100 mg by mouth daily.        Marland Kitchen omeprazole (PRILOSEC) 20 MG capsule Take 20 mg by mouth daily.        . sertraline (ZOLOFT) 50 MG tablet Take 50 mg by mouth daily.        Marland Kitchen terconazole (TERAZOL 7) 0.4 % vaginal cream Place 1 applicator vaginally at bedtime.  45 g  1   No Known Allergies Active Problems:   * No active hospital problems. *  Vitals: There were no vitals taken for this visit. Lab  results: Results for orders placed during the hospital encounter of 05/29/14 (from the past 24 hour(s))  CBC     Status: None   Collection Time    05/29/14 11:44 AM      Result Value Ref Range   WBC 10.5  4.0 - 10.5 K/uL   RBC 4.25  3.87 - 5.11 MIL/uL   Hemoglobin 13.4  12.0 - 15.0 g/dL   HCT 38.1  36.0 - 46.0 %   MCV 89.6  78.0 - 100.0 fL   MCH 31.5  26.0 - 34.0 pg   MCHC 35.2  30.0 - 36.0 g/dL   RDW 13.7  11.5 - 15.5 %   Platelets 384  150 - 400 K/uL  BASIC METABOLIC PANEL     Status: Abnormal   Collection Time    05/29/14 11:45 AM      Result Value Ref Range   Sodium 140  137 - 147 mEq/L   Potassium 3.6 (*) 3.7 - 5.3 mEq/L   Chloride  103  96 - 112 mEq/L   CO2 25  19 - 32 mEq/L   Glucose, Bld 89  70 - 99 mg/dL   BUN 11  6 - 23 mg/dL   Creatinine, Ser 0.83  0.50 - 1.10 mg/dL   Calcium 9.3  8.4 - 10.5 mg/dL   GFR calc non Af Amer 90 (*) >90 mL/min   GFR calc Af Amer >90  >90 mL/min  HCG, SERUM, QUALITATIVE     Status: None   Collection Time    05/29/14 11:56 AM      Result Value Ref Range   Preg, Serum NEGATIVE  NEGATIVE   Radiology Results: Dg Chest 2 View  05/29/2014   CLINICAL DATA:  Hypertension, wisdom teeth extraction  EXAM: CHEST  2 VIEW  COMPARISON:  09/13/2008  FINDINGS: Similar low lung volumes. Normal heart size and vascularity. No focal pneumonia, collapse or consolidation. No edema, effusion or pneumothorax. Stable exam.  IMPRESSION: Low volume exam.  No acute process.   Electronically Signed   By: Tamara [name] M.D.   On: 05/29/2014 13:32   General appearance: alert, cooperative, no distress and morbidly obese Head: Normocephalic, without obvious abnormality, atraumatic Eyes: negative Nose: Nares normal. Septum midline. Mucosa normal. No drainage or sinus tenderness. Throat: Decayed and impacted teeth #'s 1, 16, 17, 32 Neck: no adenopathy, supple, symmetrical, trachea midline and thyroid not enlarged, symmetric, no tenderness/mass/nodules Resp: clear to  auscultation bilaterally Cardio: regular rate and rhythm, S1, S2 normal, no murmur, click, rub or gallop  Assessment:37 YO Female, Hypertension,  Morbid obesity, with non-restorable/impacted teeth #'s , 1, 16, 17, 32.   Plan: extraction of teeth #'s 1, 16, 17, 32. General anesthesia. Day surgery.   Tamara [name] Tamara [name] 05/29/2014

## 2014-05-30 NOTE — Progress Notes (Signed)
Anesthesia Chart Review:  Patient is a 38 year old female scheduled for extraction of wisdom teeth on 06/03/14 by Dr. Hoyt Koch.  History includes HTN, PUD, non-smoker, anxiety, depression.  BMI is consistent with morbid obesity. PCP is listed as Dr. Sinda Du.  EKG on 05/29/14 showed: NSR, incomplete right BBB, non-specific T wave abnormality. No CV symptoms were documented at her PAT visit.  CXR on 05/29/14 showed: Low volume exam. No acute process.   Preoperative labs noted.   If no acute changes then I would anticipate that she could proceed as planned.  George Hugh [facility] Short Stay Center/Anesthesiology Phone [phone] 05/30/2014 11:10 AM

## 2014-06-02 MED ORDER — DEXTROSE 5 % IV SOLN
3.0000 g | INTRAVENOUS | Status: AC
  Administered 2014-06-03: 3 g via INTRAVENOUS
  Filled 2014-06-02: qty 3000

## 2014-06-03 ENCOUNTER — Ambulatory Visit (HOSPITAL_COMMUNITY)
Admission: RE | Admit: 2014-06-03 | Discharge: 2014-06-03 | Disposition: A | Discharge status: Home / Self Care | Payer: PRIVATE HEALTH INSURANCE | Source: Ambulatory Visit | Attending: Oral Surgery | Admitting: Oral Surgery

## 2014-06-03 ENCOUNTER — Encounter (HOSPITAL_COMMUNITY): Payer: PRIVATE HEALTH INSURANCE | Admitting: Vascular Surgery

## 2014-06-03 ENCOUNTER — Encounter (HOSPITAL_COMMUNITY): Payer: Self-pay | Admitting: *Deleted

## 2014-06-03 ENCOUNTER — Encounter (HOSPITAL_COMMUNITY)
Admission: RE | Disposition: A | Discharge status: Home / Self Care | Payer: Self-pay | Source: Ambulatory Visit | Attending: Oral Surgery

## 2014-06-03 ENCOUNTER — Ambulatory Visit (HOSPITAL_COMMUNITY): Payer: PRIVATE HEALTH INSURANCE | Admitting: Certified Registered"

## 2014-06-03 DIAGNOSIS — E669 Obesity, unspecified: Secondary | ICD-10-CM

## 2014-06-03 DIAGNOSIS — IMO0002 Reserved for concepts with insufficient information to code with codable children: Secondary | ICD-10-CM

## 2014-06-03 DIAGNOSIS — K006 Disturbances in tooth eruption: Secondary | ICD-10-CM | POA: Insufficient documentation

## 2014-06-03 DIAGNOSIS — B373 Candidiasis of vulva and vagina: Secondary | ICD-10-CM

## 2014-06-03 DIAGNOSIS — B3731 Acute candidiasis of vulva and vagina: Secondary | ICD-10-CM

## 2014-06-03 DIAGNOSIS — R109 Unspecified abdominal pain: Secondary | ICD-10-CM

## 2014-06-03 DIAGNOSIS — F411 Generalized anxiety disorder: Secondary | ICD-10-CM | POA: Insufficient documentation

## 2014-06-03 DIAGNOSIS — Z79899 Other long term (current) drug therapy: Secondary | ICD-10-CM | POA: Insufficient documentation

## 2014-06-03 DIAGNOSIS — F3289 Other specified depressive episodes: Secondary | ICD-10-CM | POA: Insufficient documentation

## 2014-06-03 DIAGNOSIS — I10 Essential (primary) hypertension: Secondary | ICD-10-CM | POA: Insufficient documentation

## 2014-06-03 DIAGNOSIS — K219 Gastro-esophageal reflux disease without esophagitis: Secondary | ICD-10-CM | POA: Insufficient documentation

## 2014-06-03 DIAGNOSIS — K011 Impacted teeth: Secondary | ICD-10-CM

## 2014-06-03 DIAGNOSIS — K027 Dental root caries: Secondary | ICD-10-CM

## 2014-06-03 DIAGNOSIS — F329 Major depressive disorder, single episode, unspecified: Secondary | ICD-10-CM | POA: Insufficient documentation

## 2014-06-03 DIAGNOSIS — K029 Dental caries, unspecified: Secondary | ICD-10-CM | POA: Insufficient documentation

## 2014-06-03 DIAGNOSIS — K625 Hemorrhage of anus and rectum: Secondary | ICD-10-CM

## 2014-06-03 HISTORY — PX: TOOTH EXTRACTION: SHX859

## 2014-06-03 SURGERY — EXTRACTION, TOOTH, MOLAR
Anesthesia: General | Site: Mouth

## 2014-06-03 MED ORDER — LACTATED RINGERS IV SOLN
INTRAVENOUS | Status: DC
  Administered 2014-06-03: 07:00:00 via INTRAVENOUS

## 2014-06-03 MED ORDER — OXYCODONE-ACETAMINOPHEN 5-325 MG PO TABS: 1.0000 | ORAL_TABLET | ORAL | Status: DC | PRN

## 2014-06-03 MED ORDER — OXYCODONE HCL 5 MG/5ML PO SOLN: 5.0000 mg | Freq: Once | ORAL | Status: DC | PRN

## 2014-06-03 MED ORDER — 0.9 % SODIUM CHLORIDE (POUR BTL) OPTIME
TOPICAL | Status: DC | PRN
  Administered 2014-06-03: 1000 mL

## 2014-06-03 MED ORDER — OXYMETAZOLINE HCL 0.05 % NA SOLN
NASAL | Status: DC | PRN
  Administered 2014-06-03: 1 via NASAL

## 2014-06-03 MED ORDER — METOCLOPRAMIDE HCL 5 MG/ML IJ SOLN: 10.0000 mg | Freq: Once | INTRAMUSCULAR | Status: DC | PRN

## 2014-06-03 MED ORDER — MIDAZOLAM HCL 2 MG/2ML IJ SOLN
INTRAMUSCULAR | Status: AC
  Filled 2014-06-03: qty 2

## 2014-06-03 MED ORDER — ONDANSETRON HCL 4 MG/2ML IJ SOLN
INTRAMUSCULAR | Status: AC
  Filled 2014-06-03: qty 2

## 2014-06-03 MED ORDER — DEXAMETHASONE SODIUM PHOSPHATE 10 MG/ML IJ SOLN
INTRAMUSCULAR | Status: AC
  Filled 2014-06-03: qty 1

## 2014-06-03 MED ORDER — PROPOFOL 10 MG/ML IV BOLUS
INTRAVENOUS | Status: AC
Start: 2014-06-03 — End: 2014-06-03
  Filled 2014-06-03: qty 20

## 2014-06-03 MED ORDER — MIDAZOLAM HCL 5 MG/5ML IJ SOLN
INTRAMUSCULAR | Status: DC | PRN
  Administered 2014-06-03: 1 mg via INTRAVENOUS

## 2014-06-03 MED ORDER — LIDOCAINE-EPINEPHRINE 2 %-1:100000 IJ SOLN
INTRAMUSCULAR | Status: AC
  Filled 2014-06-03: qty 1

## 2014-06-03 MED ORDER — DEXAMETHASONE SODIUM PHOSPHATE 10 MG/ML IJ SOLN
INTRAMUSCULAR | Status: DC | PRN
  Administered 2014-06-03: 10 mg via INTRAVENOUS

## 2014-06-03 MED ORDER — FENTANYL CITRATE 0.05 MG/ML IJ SOLN
INTRAMUSCULAR | Status: AC
  Filled 2014-06-03: qty 5

## 2014-06-03 MED ORDER — OXYMETAZOLINE HCL 0.05 % NA SOLN
NASAL | Status: AC
  Filled 2014-06-03: qty 15

## 2014-06-03 MED ORDER — ARTIFICIAL TEARS OP OINT
TOPICAL_OINTMENT | OPHTHALMIC | Status: AC
Start: 2014-06-03 — End: 2014-06-03
  Filled 2014-06-03: qty 3.5

## 2014-06-03 MED ORDER — FENTANYL CITRATE 0.05 MG/ML IJ SOLN
25.0000 ug | INTRAMUSCULAR | Status: DC | PRN
Start: 2014-06-03 — End: 2014-06-03

## 2014-06-03 MED ORDER — ONDANSETRON HCL 4 MG/2ML IJ SOLN
INTRAMUSCULAR | Status: DC | PRN
  Administered 2014-06-03: 4 mg via INTRAVENOUS

## 2014-06-03 MED ORDER — LIDOCAINE HCL (CARDIAC) 20 MG/ML IV SOLN
INTRAVENOUS | Status: AC
  Filled 2014-06-03: qty 5

## 2014-06-03 MED ORDER — PROPOFOL 10 MG/ML IV BOLUS
INTRAVENOUS | Status: DC | PRN
  Administered 2014-06-03: 100 mg via INTRAVENOUS
  Administered 2014-06-03: 300 mg via INTRAVENOUS

## 2014-06-03 MED ORDER — PROPOFOL 10 MG/ML IV BOLUS
INTRAVENOUS | Status: AC
  Filled 2014-06-03: qty 20

## 2014-06-03 MED ORDER — SODIUM CHLORIDE 0.9 % IR SOLN
Status: DC | PRN
  Administered 2014-06-03: 1000 mL

## 2014-06-03 MED ORDER — LIDOCAINE-EPINEPHRINE 2 %-1:100000 IJ SOLN
INTRAMUSCULAR | Status: DC | PRN
  Administered 2014-06-03: 20 mL via INTRADERMAL

## 2014-06-03 MED ORDER — SUCCINYLCHOLINE CHLORIDE 20 MG/ML IJ SOLN
INTRAMUSCULAR | Status: DC | PRN
  Administered 2014-06-03: 160 mg via INTRAVENOUS

## 2014-06-03 MED ORDER — LIDOCAINE HCL (CARDIAC) 20 MG/ML IV SOLN
INTRAVENOUS | Status: DC | PRN
  Administered 2014-06-03: 100 mg via INTRAVENOUS

## 2014-06-03 MED ORDER — SUCCINYLCHOLINE CHLORIDE 20 MG/ML IJ SOLN
INTRAMUSCULAR | Status: AC
  Filled 2014-06-03: qty 1

## 2014-06-03 MED ORDER — OXYCODONE HCL 5 MG PO TABS: 5.0000 mg | ORAL_TABLET | Freq: Once | ORAL | Status: DC | PRN

## 2014-06-03 SURGICAL SUPPLY — 30 items
BLADE 10 SAFETY STRL DISP (BLADE) ×2 IMPLANT
BUR CROSS CUT (BURR) ×2
BUR CROSS CUT FISSURE 1.6 (BURR) ×2 IMPLANT
BUR SRG MED 1.6XXCUT FSSR (BURR) IMPLANT
BURR SRG MED 1.6XXCUT FSSR (BURR) ×1
CANISTER SUCTION 2500CC (MISCELLANEOUS) ×2 IMPLANT
COVER SURGICAL LIGHT HANDLE (MISCELLANEOUS) ×2 IMPLANT
GAUZE PACKING FOLDED 2  STR (GAUZE/BANDAGES/DRESSINGS) ×1
GAUZE PACKING FOLDED 2 STR (GAUZE/BANDAGES/DRESSINGS) ×1 IMPLANT
GAUZE SPONGE 4X4 16PLY XRAY LF (GAUZE/BANDAGES/DRESSINGS) IMPLANT
GLOVE BIO SURGEON STRL SZ 6.5 (GLOVE) ×2 IMPLANT
GLOVE BIO SURGEON STRL SZ7.5 (GLOVE) ×2 IMPLANT
GLOVE BIOGEL PI IND STRL 7.0 (GLOVE) ×1 IMPLANT
GLOVE BIOGEL PI INDICATOR 7.0 (GLOVE) ×1
GOWN STRL REUS W/ TWL LRG LVL3 (GOWN DISPOSABLE) ×1 IMPLANT
GOWN STRL REUS W/ TWL XL LVL3 (GOWN DISPOSABLE) ×1 IMPLANT
GOWN STRL REUS W/TWL LRG LVL3 (GOWN DISPOSABLE) ×2
GOWN STRL REUS W/TWL XL LVL3 (GOWN DISPOSABLE) ×2
KIT BASIN OR (CUSTOM PROCEDURE TRAY) ×2 IMPLANT
KIT ROOM TURNOVER OR (KITS) ×2 IMPLANT
NEEDLE 22X1 1/2 (OR ONLY) (NEEDLE) ×2 IMPLANT
NS IRRIG 1000ML POUR BTL (IV SOLUTION) ×2 IMPLANT
PAD ARMBOARD 7.5X6 YLW CONV (MISCELLANEOUS) ×4 IMPLANT
SUT CHROMIC 3 0 PS 2 (SUTURE) ×4 IMPLANT
SYR CONTROL 10ML LL (SYRINGE) ×1 IMPLANT
TOWEL OR 17X26 10 PK STRL BLUE (TOWEL DISPOSABLE) ×2 IMPLANT
TRAY ENT MC OR (CUSTOM PROCEDURE TRAY) ×2 IMPLANT
TUBE CONNECTING 12X1/4 (SUCTIONS) ×1 IMPLANT
TUBING IRRIGATION (MISCELLANEOUS) ×1 IMPLANT
YANKAUER SUCT BULB TIP NO VENT (SUCTIONS) ×2 IMPLANT

## 2014-06-03 NOTE — Progress Notes (Signed)
All charting in this column done by Ivin Booty RN under my sign in. Receiving report now and will take over as primary RN

## 2014-06-03 NOTE — Transfer of Care (Signed)
Immediate Anesthesia Transfer of Care Note  Patient: Tamara [name] Men  Procedure(s) Performed: Procedure(s): EXTRACTION WISDOM TEETH (N/A)  Patient Location: PACU  Anesthesia Type:General  Level of Consciousness: awake  Airway & Oxygen Therapy: Patient Spontanous Breathing and Patient connected to face mask oxygen  Post-op Assessment: Report given to PACU RN  Post vital signs: Reviewed and stable  Complications: No apparent anesthesia complications

## 2014-06-03 NOTE — H&P (Signed)
H&P documentation  -History and Physical Reviewed  -Patient has been re-examined  -No change in the plan of care  Tamara [name] Tamara [name]

## 2014-06-03 NOTE — Op Note (Signed)
06/03/2014  9:16 AM  PATIENT:  Tamara [name]  38 y.o. female  PRE-OPERATIVE DIAGNOSIS:  IMPACTED WISDOM TEETH # 1, 16, 17, 32  POST-OPERATIVE DIAGNOSIS:  SAME  PROCEDURE:  Procedure(s): EXTRACTION WISDOM TEETH # 1, 16, 17, 32  SURGEON:  Surgeon(s): Gae Bon, DDS  ANESTHESIA:   local and general  EBL:  minimal  DRAINS: none   SPECIMEN:  No Specimen  COUNTS:  YES  PLAN OF CARE: Discharge to home after PACU  PATIENT DISPOSITION:  PACU - hemodynamically stable.   PROCEDURE DETAILS: Dictation # [id number]  Gae Bon, DMD 06/03/2014 9:16 AM

## 2014-06-03 NOTE — Anesthesia Postprocedure Evaluation (Signed)
Anesthesia Post Note  Patient: Tamara [name]  Procedure(s) Performed: Procedure(s) (LRB): EXTRACTION WISDOM TEETH (N/A)  Anesthesia type: General  Patient location: PACU  Post pain: Pain level controlled  Post assessment: Patient's Cardiovascular Status Stable  Last Vitals:  Filed Vitals:   06/03/14 1045  BP: 133/79  Pulse:   Temp:   Resp:     Post vital signs: Reviewed and stable  Level of consciousness: alert  Complications: No apparent anesthesia complications

## 2014-06-03 NOTE — Anesthesia Procedure Notes (Signed)
Procedure Name: Intubation Date/Time: 06/03/2014 8:51 AM Performed by: Barrington Ellison Pre-anesthesia Checklist: Patient identified, Emergency Drugs available, Suction available, Patient being monitored and Timeout performed Patient Re-evaluated:Patient Re-evaluated prior to inductionOxygen Delivery Method: Circle system utilized Preoxygenation: Pre-oxygenation with 100% oxygen Intubation Type: IV induction and Rapid sequence Ventilation: Two handed mask ventilation required, Oral airway inserted - appropriate to patient size and Mask ventilation with difficulty Laryngoscope Size: Mac Grade View: Grade I Tube type: Oral Tube size: 7.5 mm Number of attempts: 1 Airway Equipment and Method: Stylet and Video-laryngoscopy Placement Confirmation: ETT inserted through vocal cords under direct vision,  positive ETCO2 and breath sounds checked- equal and bilateral Secured at: 23 cm Tube secured with: Tape Dental Injury: Teeth and Oropharynx as per pre-operative assessment  Future Recommendations: Recommend- induction with short-acting agent, and alternative techniques readily available

## 2014-06-03 NOTE — Discharge Instructions (Signed)
What to Eat after Tooth extraction:     For your first meals, you should eat lightly; only small meals at first.   Avoid Sharp, Crunchy, and Hot foods.   If you do not have nausea, you may eat larger meals.  Avoid spicy, greasy and heavy food, as these may make you sick after the anesthesia.    General Anesthesia, Adult, Care After  Refer to this sheet in the next few weeks. These instructions provide you with information on caring for yourself after your procedure. Your health care provider may also give you more specific instructions. Your treatment has been planned according to current medical practices, but problems sometimes occur. Call your health care provider if you have any problems or questions after your procedure.  WHAT TO EXPECT AFTER THE PROCEDURE  After the procedure, it is typical to experience:  Sleepiness.  Nausea and vomiting. HOME CARE INSTRUCTIONS  For the first 24 hours after general anesthesia:  Have a responsible person with you.  Do not drive a car. If you are alone, do not take public transportation.  Do not drink alcohol.  Do not take medicine that has not been prescribed by your health care provider.  Do not sign important papers or make important decisions.  You may resume a normal diet and activities as directed by your health care provider.  Change bandages (dressings) as directed.  If you have questions or problems that seem related to general anesthesia, call the hospital and ask for the anesthetist or anesthesiologist on call. SEEK MEDICAL CARE IF:  You have nausea and vomiting that continue the day after anesthesia.  You develop a rash. SEEK IMMEDIATE MEDICAL CARE IF:  You have difficulty breathing.  You have chest pain.  You have any allergic problems. Document Released: 03/21/2001 Document Revised: 08/15/2013 Document Reviewed: 06/28/2013  [facility] Patient Information 2014 [place], [place].     Marland Kitchen

## 2014-06-03 NOTE — Anesthesia Preprocedure Evaluation (Addendum)
Anesthesia Evaluation  Patient identified by MRN, date of birth, ID band Patient awake    Reviewed: Allergy & Precautions, H&P , NPO status , Patient's Chart, lab work & pertinent test results, reviewed documented beta blocker date and time   Airway Mallampati: II TM Distance: >3 FB Neck ROM: full    Dental  (+) Teeth Intact, Dental Advisory Given   Pulmonary neg pulmonary ROS,  breath sounds clear to auscultation        Cardiovascular hypertension, On Medications and On Home Beta Blockers Rhythm:regular     Neuro/Psych PSYCHIATRIC DISORDERS negative neurological ROS     GI/Hepatic Neg liver ROS, PUD,   Endo/Other  Morbid obesity  Renal/GU negative Renal ROS  negative genitourinary   Musculoskeletal   Abdominal   Peds  Hematology negative hematology ROS (+)   Anesthesia Other Findings See surgeon's H&P   Reproductive/Obstetrics negative OB ROS                          Anesthesia Physical Anesthesia Plan  ASA: III  Anesthesia Plan: General   Post-op Pain Management:    Induction: Intravenous  Airway Management Planned: Nasal ETT and Oral ETT  Additional Equipment:   Intra-op Plan:   Post-operative Plan: Extubation in OR  Informed Consent: I have reviewed the patients History and Physical, chart, labs and discussed the procedure including the risks, benefits and alternatives for the proposed anesthesia with the patient or authorized representative who has indicated his/her understanding and acceptance.   Dental Advisory Given  Plan Discussed with: CRNA and Surgeon  Anesthesia Plan Comments:         Anesthesia Quick Evaluation

## 2014-06-04 ENCOUNTER — Encounter (HOSPITAL_COMMUNITY): Payer: Self-pay | Admitting: Oral Surgery

## 2014-06-04 NOTE — Op Note (Signed)
Tamara, [name]                 ACCOUNT NO.:  [id number]  MEDICAL RECORD NO.:  [id number]  LOCATION:  [facility]                         FACILITY:  [facility]  PHYSICIAN:  Gae Bon, M.D.  DATE OF BIRTH:  September 27, 1976  DATE OF PROCEDURE:  06/03/2014 DATE OF DISCHARGE:  06/03/2014                              OPERATIVE REPORT   PREOPERATIVE DIAGNOSIS:  Impacted and nonrestorable teeth #1, #16, #17, #32.  POSTOPERATIVE DIAGNOSIS:  Impacted and nonrestorable teeth #1, #16, #17, #32.  PROCEDURE:  Extraction of teeth #1, #16, #17, #32.  SURGEON:  Gae Bon, M.D.  ANESTHESIA:  General oral, Charles E. Albertina Parr, M.D., is attending.  DESCRIPTION OF PROCEDURE:  The patient was taken to the operating room, placed on the table in supine position.  General anesthesia was administered intravenously and an oral endotracheal tube was placed and marked.  The eyes were protected and the patient was draped for the procedure.  Time-out was then performed.  The posterior pharynx was suctioned.  A throat pack was placed.  A 2% lidocaine with 1:100,000 epinephrine was infiltrated in an inferior alveolar block on the right and the left side and a buccal and palatal infiltration in the right and left maxilla.  Total of 12 mL of anesthetic solution was utilized.  A bite block was placed in the right side of the mouth and a Sweetheart retractor was used to retract the tongue.  Then, a #15 blade was used to excise granulation tissue overlying the roots of tooth #17 and to excise around tooth #16 in the maxilla.  The periosteum was reflected with a periosteal elevator and then bone was removed around the mandibular roots with a Stryker handpiece under irrigation.  Then, the roots were elevated and removed with a 301 elevator.  In the maxilla, the 301 elevator was used to luxate tooth #16 and the tooth was removed with the upper #150 forceps.  The sockets in the maxilla and mandible on the left side  were then curetted and irrigated with normal saline and then closed with 3-0 chromic to the bite block and Sweetheart retractor was repositioned to the other side of the mouth as was the endotracheal tube, and a 15 blade was used to make an incision around teeth #1 and #32.  The periosteum was reflected over tooth #1 and around tooth #32 with a periosteal elevator.  Then, bone was removed using the Stryker handpiece.  The teeth were elevated and removed with a 301 elevator in the mandible and the 301 elevator and #150 forceps in the maxilla.  The sockets were then curetted, irrigated, and closed with 3-0 chromic.  The oral cavity was inspected, found to have good contour hemostasis and closure.  The oral cavity was irrigated and suctioned and throat pack was removed.  The patient was awakened and taken to the recovery room, breathing spontaneously in good condition.  ESTIMATED BLOOD LOSS:  Minimal.  COMPLICATIONS:  None.  SPECIMENS:  None.     Gae Bon, M.D.     SMJ/MEDQ  D:  06/03/2014  T:  06/04/2014  Job:  [id number]

## 2014-09-24 ENCOUNTER — Other Ambulatory Visit: Payer: PRIVATE HEALTH INSURANCE | Admitting: Adult Health

## 2015-01-07 ENCOUNTER — Other Ambulatory Visit: Payer: PRIVATE HEALTH INSURANCE | Admitting: Adult Health

## 2015-01-16 ENCOUNTER — Other Ambulatory Visit: Payer: Medicaid Other | Admitting: Adult Health

## 2015-01-23 ENCOUNTER — Encounter: Payer: Self-pay | Admitting: Adult Health

## 2015-01-23 ENCOUNTER — Ambulatory Visit (INDEPENDENT_AMBULATORY_CARE_PROVIDER_SITE_OTHER): Payer: Medicaid Other | Admitting: Adult Health

## 2015-01-23 VITALS — BP 128/88 | HR 80 | Ht 62.5 in | Wt 314.2 lb

## 2015-01-23 DIAGNOSIS — Z01419 Encounter for gynecological examination (general) (routine) without abnormal findings: Secondary | ICD-10-CM

## 2015-01-23 DIAGNOSIS — E669 Obesity, unspecified: Secondary | ICD-10-CM

## 2015-01-23 DIAGNOSIS — Z0000 Encounter for general adult medical examination without abnormal findings: Secondary | ICD-10-CM

## 2015-01-23 NOTE — Patient Instructions (Signed)
Diabetes Mellitus and Food It is important for you to manage your blood sugar (glucose) level. Your blood glucose level can be greatly affected by what you eat. Eating healthier foods in the appropriate amounts throughout the day at about the same time each day will help you control your blood glucose level. It can also help slow or prevent worsening of your diabetes mellitus. Healthy eating may even help you improve the level of your blood pressure and reach or maintain a healthy weight.  HOW CAN FOOD AFFECT ME? Carbohydrates Carbohydrates affect your blood glucose level more than any other type of food. Your dietitian will help you determine how many carbohydrates to eat at each meal and teach you how to count carbohydrates. Counting carbohydrates is important to keep your blood glucose at a healthy level, especially if you are using insulin or taking certain medicines for diabetes mellitus. Alcohol Alcohol can cause sudden decreases in blood glucose (hypoglycemia), especially if you use insulin or take certain medicines for diabetes mellitus. Hypoglycemia can be a life-threatening condition. Symptoms of hypoglycemia (sleepiness, dizziness, and disorientation) are similar to symptoms of having too much alcohol.  If your health care provider has given you approval to drink alcohol, do so in moderation and use the following guidelines:  Women should not have more than one drink per day, and men should not have more than two drinks per day. One drink is equal to:  12 oz of beer.  5 oz of wine.  1 oz of hard liquor.  Do not drink on an empty stomach.  Keep yourself hydrated. Have water, diet soda, or unsweetened iced tea.  Regular soda, juice, and other mixers might contain a lot of carbohydrates and should be counted. WHAT FOODS ARE NOT RECOMMENDED? As you make food choices, it is important to remember that all foods are not the same. Some foods have fewer nutrients per serving than other  foods, even though they might have the same number of calories or carbohydrates. It is difficult to get your body what it needs when you eat foods with fewer nutrients. Examples of foods that you should avoid that are high in calories and carbohydrates but low in nutrients include:  Trans fats (most processed foods list trans fats on the Nutrition Facts label).  Regular soda.  Juice.  Candy.  Sweets, such as cake, pie, doughnuts, and cookies.  Fried foods. WHAT FOODS CAN I EAT? Have nutrient-rich foods, which will nourish your body and keep you healthy. The food you should eat also will depend on several factors, including:  The calories you need.  The medicines you take.  Your weight.  Your blood glucose level.  Your blood pressure level.  Your cholesterol level. You also should eat a variety of foods, including:  Protein, such as meat, poultry, fish, tofu, nuts, and seeds (lean animal proteins are best).  Fruits.  Vegetables.  Dairy products, such as milk, cheese, and yogurt (low fat is best).  Breads, grains, pasta, cereal, rice, and beans.  Fats such as olive oil, trans fat-free margarine, canola oil, avocado, and olives. DOES EVERYONE WITH DIABETES MELLITUS HAVE THE SAME MEAL PLAN? Because every person with diabetes mellitus is different, there is not one meal plan that works for everyone. It is very important that you meet with a dietitian who will help you create a meal plan that is just right for you. Document Released: 09/09/2005 Document Revised: 12/18/2013 Document Reviewed: 11/09/2013 [employer] Patient Information 2015 [employer]. This   information is not intended to replace advice given to you by your health care provider. Make sure you discuss any questions you have with your health care provider. Calorie Counting for Weight Loss Calories are energy you get from the things you eat and drink. Your body uses this energy to keep you going throughout the day.  The number of calories you eat affects your weight. When you eat more calories than your body needs, your body stores the extra calories as fat. When you eat fewer calories than your body needs, your body burns fat to get the energy it needs. Calorie counting means keeping track of how many calories you eat and drink each day. If you make sure to eat fewer calories than your body needs, you should lose weight. In order for calorie counting to work, you will need to eat the number of calories that are right for you in a day to lose a healthy amount of weight per week. A healthy amount of weight to lose per week is usually 1-2 lb (0.5-0.9 kg). A dietitian can determine how many calories you need in a day and give you suggestions on how to reach your calorie goal.  WHAT IS MY MY PLAN? My goal is to have __________ calories per day.  If I have this many calories per day, I should lose around __________ pounds per week. WHAT DO I NEED TO KNOW ABOUT CALORIE COUNTING? In order to meet your daily calorie goal, you will need to:  Find out how many calories are in each food you would like to eat. Try to do this before you eat.  Decide how much of the food you can eat.  Write down what you ate and how many calories it had. Doing this is called keeping a food log. WHERE DO I FIND CALORIE INFORMATION? The number of calories in a food can be found on a Nutrition Facts label. Note that all the information on a label is based on a specific serving of the food. If a food does not have a Nutrition Facts label, try to look up the calories online or ask your dietitian for help. HOW DO I DECIDE HOW MUCH TO EAT? To decide how much of the food you can eat, you will need to consider both the number of calories in one serving and the size of one serving. This information can be found on the Nutrition Facts label. If a food does not have a Nutrition Facts label, look up the information online or ask your dietitian for  help. Remember that calories are listed per serving. If you choose to have more than one serving of a food, you will have to multiply the calories per serving by the amount of servings you plan to eat. For example, the label on a package of bread might say that a serving size is 1 slice and that there are 90 calories in a serving. If you eat 1 slice, you will have eaten 90 calories. If you eat 2 slices, you will have eaten 180 calories. HOW DO I KEEP A FOOD LOG? After each meal, record the following information in your food log:  What you ate.  How much of it you ate.  How many calories it had.  Then, add up your calories. Keep your food log near you, such as in a small notebook in your pocket. Another option is to use a mobile app or website. Some programs will calculate calories for you and  show you how many calories you have left each time you add an item to the log. WHAT ARE SOME CALORIE COUNTING TIPS?  Use your calories on foods and drinks that will fill you up and not leave you hungry. Some examples of this include foods like nuts and nut butters, vegetables, lean proteins, and high-fiber foods (more than 5 g fiber per serving).  Eat nutritious foods and avoid empty calories. Empty calories are calories you get from foods or beverages that do not have many nutrients, such as candy and soda. It is better to have a nutritious high-calorie food (such as an avocado) than a food with few nutrients (such as a bag of chips).  Know how many calories are in the foods you eat most often. This way, you do not have to look up how many calories they have each time you eat them.  Look out for foods that may seem like low-calorie foods but are really high-calorie foods, such as baked goods, soda, and fat-free candy.  Pay attention to calories in drinks. Drinks such as sodas, specialty coffee drinks, alcohol, and juices have a lot of calories yet do not fill you up. Choose low-calorie drinks like water  and diet drinks.  Focus your calorie counting efforts on higher calorie items. Logging the calories in a garden salad that contains only vegetables is less important than calculating the calories in a milk shake.  Find a way of tracking calories that works for you. Get creative. Most people who are successful find ways to keep track of how much they eat in a day, even if they do not count every calorie. WHAT ARE SOME PORTION CONTROL TIPS?  Know how many calories are in a serving. This will help you know how many servings of a certain food you can have.  Use a measuring cup to measure serving sizes. This is helpful when you start out. With time, you will be able to estimate serving sizes for some foods.  Take some time to put servings of different foods on your favorite plates, bowls, and cups so you know what a serving looks like.  Try not to eat straight from a bag or box. Doing this can lead to overeating. Put the amount you would like to eat in a cup or on a plate to make sure you are eating the right portion.  Use smaller plates, glasses, and bowls to prevent overeating. This is a quick and easy way to practice portion control. If your plate is smaller, less food can fit on it.  Try not to multitask while eating, such as watching TV or using your computer. If it is time to eat, sit down at a table and enjoy your food. Doing this will help you to start recognizing when you are full. It will also make you more aware of what and how much you are eating. HOW CAN I CALORIE COUNT WHEN EATING OUT?  Ask for smaller portion sizes or child-sized portions.  Consider sharing an entree and sides instead of getting your own entree.  If you get your own entree, eat only half. Ask for a box at the beginning of your meal and put the rest of your entree in it so you are not tempted to eat it.  Look for the calories on the menu. If calories are listed, choose the lower calorie options.  Choose dishes  that include vegetables, fruits, whole grains, low-fat dairy products, and lean protein. Focusing on smart  food choices from each of the 5 food groups can help you stay on track at restaurants.  Choose items that are boiled, broiled, grilled, or steamed.  Choose water, milk, unsweetened iced tea, or other drinks without added sugars. If you want an alcoholic beverage, choose a lower calorie option. For example, a regular margarita can have up to 700 calories and a glass of wine has around 150.  Stay away from items that are buttered, battered, fried, or served with cream sauce. Items labeled "crispy" are usually fried, unless stated otherwise.  Ask for dressings, sauces, and syrups on the side. These are usually very high in calories, so do not eat much of them.  Watch out for salads. Many people think salads are a healthy option, but this is often not the case. Many salads come with bacon, fried chicken, lots of cheese, fried chips, and dressing. All of these items have a lot of calories. If you want a salad, choose a garden salad and ask for grilled meats or steak. Ask for the dressing on the side, or ask for olive oil and vinegar or lemon to use as dressing.  Estimate how many servings of a food you are given. For example, a serving of cooked rice is  cup or about the size of half a tennis ball or one cupcake wrapper. Knowing serving sizes will help you be aware of how much food you are eating at restaurants. The list below tells you how big or small some common portion sizes are based on everyday objects.  1 oz--4 stacked dice.  3 oz--1 deck of cards.  1 tsp--1 dice.  1 Tbsp-- a Ping-Pong ball.  2 Tbsp--1 Ping-Pong ball.   cup--1 tennis ball or 1 cupcake wrapper.  1 cup--1 baseball. Document Released: 12/13/2005 Document Revised: 04/29/2014 Document Reviewed: 10/18/2013 [facility] Patient Information 2015 [place], [place]. This information is not intended to replace advice given to  you by your health care provider. Make sure you discuss any questions you have with your health care provider. Physical in 1 year

## 2015-01-23 NOTE — Progress Notes (Signed)
Patient ID: Tamara [name], female   DOB: 22-Jun-1976, 39 y.o.   MRN: [id number] History of Present Illness: Tamara [name] is a 39 year old black female,single, who resides with her Mom, she is here for gyn exam.She had normal pap with negative HPV 09/19/13.Dr Luan Pulling is her PCP.She is mentally slow.   Current Medications, Allergies, Past Medical History, Past Surgical History, Family History and Social History were reviewed in [employer] record.     Review of Systems: Patient denies any headaches, blurred vision, shortness of breath, chest pain, abdominal pain, problems with bowel movements, or urination.She is not sexually active,No joint pain or mood swings.    Physical Exam:BP 128/88 mmHg  Pulse 80  Ht 5' 2.5" (1.588 m)  Wt 314 lb 3.2 oz (142.52 kg)  BMI 56.52 kg/m2  LMP 01/13/2015 General:  Well developed, well nourished, no acute distress Skin:  Warm and dry Neck:  Midline trachea, normal thyroid Lungs; Clear to auscultation bilaterally Breast:  No dominant palpable mass, retraction, or nipple discharge Cardiovascular: Regular rate and rhythm Abdomen:  Soft, non tender, no hepatosplenomegaly, obese Pelvic:  External genitalia is normal in appearance, with several epidermal cysts.  The vagina is normal in appearance. The cervix is smooth.  Uterus is felt to be normal size, shape, and contour.  No                adnexal masses or tenderness noted.But is difficult secondary to abdominal girth. Extremities:  No swelling or varicosities noted Psych:  No mood changes,alert and cooperative,seems happy Discussed with her and her Mom to stop all sugared drinks and decrease sweets and breads and pasta, that she really needs to lose some weight.  Impression: Well woman gyn exam no pap Obesity     Plan: Review handouts on weight loss and Diabetic diet Physical in 1 year Mammogram at 41  Labs with Dr Luan Pulling

## 2015-03-13 DIAGNOSIS — L0291 Cutaneous abscess, unspecified: Secondary | ICD-10-CM | POA: Diagnosis not present

## 2015-03-13 DIAGNOSIS — I10 Essential (primary) hypertension: Secondary | ICD-10-CM | POA: Diagnosis not present

## 2015-03-13 DIAGNOSIS — E669 Obesity, unspecified: Secondary | ICD-10-CM | POA: Diagnosis not present

## 2015-03-17 DIAGNOSIS — L989 Disorder of the skin and subcutaneous tissue, unspecified: Secondary | ICD-10-CM | POA: Diagnosis not present

## 2015-04-21 DIAGNOSIS — I10 Essential (primary) hypertension: Secondary | ICD-10-CM | POA: Diagnosis not present

## 2015-05-20 DIAGNOSIS — L989 Disorder of the skin and subcutaneous tissue, unspecified: Secondary | ICD-10-CM | POA: Diagnosis not present

## 2015-06-25 DIAGNOSIS — F329 Major depressive disorder, single episode, unspecified: Secondary | ICD-10-CM | POA: Diagnosis not present

## 2015-06-25 DIAGNOSIS — F419 Anxiety disorder, unspecified: Secondary | ICD-10-CM | POA: Diagnosis not present

## 2015-06-25 DIAGNOSIS — I10 Essential (primary) hypertension: Secondary | ICD-10-CM | POA: Diagnosis not present

## 2015-06-25 DIAGNOSIS — E669 Obesity, unspecified: Secondary | ICD-10-CM | POA: Diagnosis not present

## 2015-10-27 DIAGNOSIS — F419 Anxiety disorder, unspecified: Secondary | ICD-10-CM | POA: Diagnosis not present

## 2015-10-27 DIAGNOSIS — R51 Headache: Secondary | ICD-10-CM | POA: Diagnosis not present

## 2015-10-27 DIAGNOSIS — E669 Obesity, unspecified: Secondary | ICD-10-CM | POA: Diagnosis not present

## 2015-10-27 DIAGNOSIS — Z23 Encounter for immunization: Secondary | ICD-10-CM | POA: Diagnosis not present

## 2015-10-27 DIAGNOSIS — I10 Essential (primary) hypertension: Secondary | ICD-10-CM | POA: Diagnosis not present

## 2016-01-26 ENCOUNTER — Encounter: Payer: Self-pay | Admitting: Adult Health

## 2016-01-26 ENCOUNTER — Ambulatory Visit (INDEPENDENT_AMBULATORY_CARE_PROVIDER_SITE_OTHER): Payer: Medicaid Other | Admitting: Adult Health

## 2016-01-26 VITALS — BP 150/80 | HR 68 | Ht 62.0 in | Wt 303.0 lb

## 2016-01-26 DIAGNOSIS — Z0000 Encounter for general adult medical examination without abnormal findings: Secondary | ICD-10-CM

## 2016-01-26 DIAGNOSIS — N907 Vulvar cyst: Secondary | ICD-10-CM

## 2016-01-26 DIAGNOSIS — L723 Sebaceous cyst: Secondary | ICD-10-CM | POA: Diagnosis not present

## 2016-01-26 DIAGNOSIS — R319 Hematuria, unspecified: Secondary | ICD-10-CM | POA: Diagnosis not present

## 2016-01-26 DIAGNOSIS — Z1389 Encounter for screening for other disorder: Secondary | ICD-10-CM | POA: Diagnosis not present

## 2016-01-26 DIAGNOSIS — Z01411 Encounter for gynecological examination (general) (routine) with abnormal findings: Secondary | ICD-10-CM | POA: Diagnosis not present

## 2016-01-26 DIAGNOSIS — N939 Abnormal uterine and vaginal bleeding, unspecified: Secondary | ICD-10-CM | POA: Diagnosis not present

## 2016-01-26 DIAGNOSIS — Z01419 Encounter for gynecological examination (general) (routine) without abnormal findings: Secondary | ICD-10-CM

## 2016-01-26 DIAGNOSIS — E669 Obesity, unspecified: Secondary | ICD-10-CM

## 2016-01-26 HISTORY — DX: Vulvar cyst: N90.7

## 2016-01-26 HISTORY — DX: Abnormal uterine and vaginal bleeding, unspecified: N93.9

## 2016-01-26 HISTORY — DX: Hematuria, unspecified: R31.9

## 2016-01-26 LAB — POCT URINALYSIS DIPSTICK
Glucose, UA: NEGATIVE
Leukocytes, UA: NEGATIVE
Nitrite, UA: NEGATIVE

## 2016-01-26 NOTE — Patient Instructions (Signed)
Pap and physical in 1 year Return in 1 week for gyn [place] Mammogram at 19  Follow up with Dr Luan Pulling in am  Dysfunctional Uterine Bleeding Dysfunctional uterine bleeding is abnormal bleeding from the uterus. Dysfunctional uterine bleeding includes:  A period that comes earlier or later than usual.  A period that is lighter, heavier, or has blood clots.  Bleeding between periods.  Skipping one or more periods.  Bleeding after sexual intercourse.  Bleeding after menopause. HOME CARE INSTRUCTIONS  Pay attention to any changes in your symptoms. Follow these instructions to help with your condition: Eating  Eat well-balanced meals. Include foods that are high in iron, such as liver, meat, shellfish, green leafy vegetables, and eggs.  If you become constipated:  Drink plenty of water.  Eat fruits and vegetables that are high in water and fiber, such as spinach, carrots, raspberries, apples, and mango. Medicines  Take over-the-counter and prescription medicines only as told by your health care provider.  Do not change medicines without talking with your health care provider.  Aspirin or medicines that contain aspirin may make the bleeding worse. Do not take those medicines:  During the week before your period.  During your period.  If you were prescribed iron pills, take them as told by your health care provider. Iron pills help to replace iron that your body loses because of this condition. Activity  If you need to change your sanitary pad or tampon more than one time every 2 hours:  Lie in bed with your feet raised (elevated).  Place a cold pack on your lower abdomen.  Rest as much as possible until the bleeding stops or slows down.  Do not try to lose weight until the bleeding has stopped and your blood iron level is back to normal. Other Instructions  For two months, write down:  When your period starts.  When your period ends.  When any abnormal bleeding  occurs.  What problems you notice.  Keep all follow up visits as told by your health care provider. This is important. SEEK MEDICAL CARE IF:  You get light-headed or weak.  You have nausea and vomiting.  You cannot eat or drink without vomiting.  You feel dizzy or have diarrhea while you are taking medicines.  You are taking birth control pills or hormones, and you want to change them or stop taking them. SEEK IMMEDIATE MEDICAL CARE IF:  You develop a fever or chills.  You need to change your sanitary pad or tampon more than one time per hour.  Your bleeding becomes heavier, or your flow contains clots more often.  You develop pain in your abdomen.  You lose consciousness.  You develop a rash.   This information is not intended to replace advice given to you by your health care provider. Make sure you discuss any questions you have with your health care provider.   Document Released: 12/10/2000 Document Revised: 09/03/2015 Document Reviewed: 03/10/2015 [employer] Interactive Patient Education 2016 [employer] [place] [place]

## 2016-01-26 NOTE — Progress Notes (Addendum)
Patient ID: Tamara [name], female   DOB: Apr 08, 1976, 40 y.o.   MRN: [id number] History of Present Illness: Zailee is a 40 year old black female in for her well woman gyn exam,she had a normal pap 09/19/13.She complains of vaginal bleeding when she wipes and is not on period.She has lost 11 lbs since last January.Has labs with Dr Luan Pulling. PCP is Dr Luan Pulling   Current Medications, Allergies, Past Medical History, Past Surgical History, Family History and Social History were reviewed in [facility] record.     Review of Systems: Patient denies any headaches, hearing loss, fatigue, blurred vision, shortness of breath, chest pain, abdominal pain, problems with bowel movements, urination, or intercourse(not having sex). No joint pain or mood swings.See HPI for positives.    Physical Exam:BP 150/80 mmHg  Pulse 68  Ht 5\' 2"  (1.575 m)  Wt 303 lb (137.44 kg)  BMI 55.41 kg/m2Urine 3+ blood and 3+ protein  General:  Well developed, well nourished, no acute distress Skin:  Warm and dry Neck:  Midline trachea, normal thyroid, good ROM, no lymphadenopathy Lungs; Clear to auscultation bilaterally Breast:  No dominant palpable mass, retraction, or nipple discharge Cardiovascular: Regular rate and rhythm Abdomen:  Soft, non tender, no hepatosplenomegaly.obese Pelvic:  External genitalia is normal in appearance, except has numerous sebaceous cysts on labia, L>R,  The vagina is normal in appearance, with pinkish discharge. Urethra has no lesions or masses. The cervix is smooth, poorly seen.  Uterus is felt to be normal size, shape, and contour. But difficult exam secondary to abdominal girth. No adnexal masses or tenderness noted.Bladder is non tender, no masses felt. Extremities/musculoskeletal:  No swelling or varicosities noted, no clubbing or cyanosis Psych:  No mood changes, alert and cooperative,seems happy Will get [place] to assess uterus in 1 week, increase water and encouraged to keep  working on weight.  Impression: Well woman gyn exam no pap Obesity AUB Hematuria  Sebaceous cysts of labia     Plan: UA C&S and GC/CHL sent  Follow up in am with Dr Luan Pulling for BP and meds Return in 1 week for gyn [place] Pap and physical in 1 year Mammogram at 41  Review handout on AUB

## 2016-01-27 DIAGNOSIS — I10 Essential (primary) hypertension: Secondary | ICD-10-CM | POA: Diagnosis not present

## 2016-01-27 LAB — URINALYSIS, ROUTINE W REFLEX MICROSCOPIC
Bilirubin, UA: NEGATIVE
Glucose, UA: NEGATIVE
Ketones, UA: NEGATIVE
Leukocytes, UA: NEGATIVE
Nitrite, UA: NEGATIVE
Specific Gravity, UA: 1.021 (ref 1.005–1.030)
Urobilinogen, Ur: 0.2 mg/dL (ref 0.2–1.0)
pH, UA: 6 (ref 5.0–7.5)

## 2016-01-27 LAB — MICROSCOPIC EXAMINATION: Bacteria, UA: NONE SEEN

## 2016-01-28 ENCOUNTER — Telehealth: Payer: Self-pay | Admitting: Adult Health

## 2016-01-28 LAB — URINE CULTURE

## 2016-01-28 MED ORDER — SULFAMETHOXAZOLE-TRIMETHOPRIM 800-160 MG PO TABS: 1.0000 | ORAL_TABLET | Freq: Two times a day (BID) | ORAL | Status: DC

## 2016-01-28 NOTE — Telephone Encounter (Signed)
Left message called antibiotic in for UTI, check with drug store

## 2016-02-04 ENCOUNTER — Other Ambulatory Visit: Payer: Medicaid Other

## 2016-02-09 ENCOUNTER — Ambulatory Visit (INDEPENDENT_AMBULATORY_CARE_PROVIDER_SITE_OTHER): Payer: Medicare Other

## 2016-02-09 DIAGNOSIS — N939 Abnormal uterine and vaginal bleeding, unspecified: Secondary | ICD-10-CM

## 2016-02-09 NOTE — Progress Notes (Signed)
PELVIC US TA/TV: normal homogenous anteverted uterus,EEC 5.48mm,mult small nabothian cysts,limited view of rt ov,not seen on TV,enlarged left ov w/mult peripheral follicles and one simple cyst 2.8 x 2.5 x 2.5 cm,no free fluid,no pain during ultrasound

## 2016-02-10 ENCOUNTER — Telehealth: Payer: Self-pay | Admitting: Adult Health

## 2016-02-10 ENCOUNTER — Encounter: Payer: Self-pay | Admitting: Adult Health

## 2016-02-10 DIAGNOSIS — N83202 Unspecified ovarian cyst, left side: Secondary | ICD-10-CM

## 2016-02-10 DIAGNOSIS — N83209 Unspecified ovarian cyst, unspecified side: Secondary | ICD-10-CM

## 2016-02-10 HISTORY — DX: Unspecified ovarian cyst, unspecified side: N83.209

## 2016-02-10 NOTE — Telephone Encounter (Signed)
Pt aware of [place] and has cyst left ovary, it may go and come.

## 2016-07-02 ENCOUNTER — Ambulatory Visit: Payer: Medicare Other | Admitting: Adult Health

## 2016-09-16 ENCOUNTER — Encounter: Payer: Self-pay | Admitting: Adult Health

## 2016-09-16 ENCOUNTER — Other Ambulatory Visit (HOSPITAL_COMMUNITY)
Admission: RE | Admit: 2016-09-16 | Discharge: 2016-09-16 | Disposition: A | Discharge status: Home / Self Care | Payer: Medicare Other | Source: Ambulatory Visit | Attending: Adult Health | Admitting: Adult Health

## 2016-09-16 ENCOUNTER — Ambulatory Visit (INDEPENDENT_AMBULATORY_CARE_PROVIDER_SITE_OTHER): Payer: Medicare Other | Admitting: Adult Health

## 2016-09-16 VITALS — BP 112/82 | HR 68 | Ht 61.5 in | Wt 302.5 lb

## 2016-09-16 DIAGNOSIS — Z1151 Encounter for screening for human papillomavirus (HPV): Secondary | ICD-10-CM | POA: Insufficient documentation

## 2016-09-16 DIAGNOSIS — Z01411 Encounter for gynecological examination (general) (routine) with abnormal findings: Secondary | ICD-10-CM

## 2016-09-16 DIAGNOSIS — F329 Major depressive disorder, single episode, unspecified: Secondary | ICD-10-CM | POA: Diagnosis not present

## 2016-09-16 DIAGNOSIS — L723 Sebaceous cyst: Secondary | ICD-10-CM | POA: Diagnosis not present

## 2016-09-16 DIAGNOSIS — Z01419 Encounter for gynecological examination (general) (routine) without abnormal findings: Secondary | ICD-10-CM | POA: Diagnosis not present

## 2016-09-16 DIAGNOSIS — Z113 Encounter for screening for infections with a predominantly sexual mode of transmission: Secondary | ICD-10-CM | POA: Insufficient documentation

## 2016-09-16 DIAGNOSIS — R319 Hematuria, unspecified: Secondary | ICD-10-CM

## 2016-09-16 DIAGNOSIS — R309 Painful micturition, unspecified: Secondary | ICD-10-CM

## 2016-09-16 DIAGNOSIS — F32A Depression, unspecified: Secondary | ICD-10-CM

## 2016-09-16 DIAGNOSIS — Z124 Encounter for screening for malignant neoplasm of cervix: Secondary | ICD-10-CM

## 2016-09-16 DIAGNOSIS — N907 Vulvar cyst: Secondary | ICD-10-CM

## 2016-09-16 LAB — POCT URINALYSIS DIPSTICK
Blood, UA: POSITIVE
Glucose, UA: NEGATIVE
Leukocytes, UA: NEGATIVE
Nitrite, UA: NEGATIVE
Protein, UA: NEGATIVE

## 2016-09-16 NOTE — Progress Notes (Signed)
Patient ID: [name], female   DOB: 03-Apr-1976, 40 y.o.   MRN: [id number] History of Present Illness: Tamara [name] is a 40 year old black female,mentally slow,(she lives with her parents) in for well woman gyn exam and pap, she has pain with urination, and is feeling depressed at times, she is on Cymbalta and xanax, she says xanax makes her sleepy. PCP is Dr Luan Pulling.    Current Medications, Allergies, Past Medical History, Past Surgical History, Family History and Social History were reviewed in [employer] record.     Review of Systems: Patient denies any headaches, hearing loss, fatigue, blurred vision, shortness of breath, chest pain, abdominal pain, problems with bowel movements,  or intercourse(not having sex). No joint pain or mood swings. Pain with urination, feels depressed at times   Physical Exam:BP 112/82 (BP Location: Left Arm, Patient Position: Sitting, Cuff Size: Large)   Pulse 68   Ht 5' 1.5" (1.562 m)   Wt (!) 302 lb 8 oz (137.2 kg)   LMP 09/09/2016   BMI 56.23 kg/m urine 3+ blood General:  Well developed, well nourished, no acute distress Skin:  Warm and dry Neck:  Midline trachea, normal thyroid, good ROM, no lymphadenopathy,has healing boil on back of neck Lungs; Clear to auscultation bilaterally Breast:  No dominant palpable mass, retraction, or nipple discharge Cardiovascular: Regular rate and rhythm Abdomen:  Soft, non tender, no hepatosplenomegaly,obese Pelvic:  External genitalia is normal in appearance, except has numerous sebaceous cysts left labia.  The vagina is normal in appearance, with period like blood. Urethra has no lesions or masses. The cervix is poorly seen, pap with HPV and GC/CHL performed.  Uterus is felt to be normal size, shape, and contour.  No adnexal masses or tenderness noted.Bladder is non tender, no masses felt. Extremities/musculoskeletal:  No swelling or varicosities noted, no clubbing or cyanosis Psych:  No mood  changes, alert and cooperative,seems happy PHQ 9 score 18, is on meds by Dr Luan Pulling, needs to follow up with him, she denies any suicidal ideations.   Impression: 1. Encounter for gynecological examination with Papanicolaou smear of cervix   2. Routine cervical smear   3. Sebaceous cyst of labia   4. Depression   5. Pain with urination   6. Hematuria       Plan: UA C&S sent Follow up with Dr Luan Pulling about depression,already on meds  Physical in 1 year, pap in 3 if normal Push fluids Mammogram at 40

## 2016-09-16 NOTE — Addendum Note (Signed)
Addended by: Linton Rump on: 09/16/2016 03:57 PM   Modules accepted: Orders

## 2016-09-16 NOTE — Patient Instructions (Addendum)
Follow up with Dr Luan Pulling about depression,already on meds  Physical in 1 year, pap in 3 if normal Push fluids Mammogram at 41

## 2016-09-17 LAB — MICROSCOPIC EXAMINATION: Casts: NONE SEEN /lpf

## 2016-09-17 LAB — URINALYSIS, ROUTINE W REFLEX MICROSCOPIC
Bilirubin, UA: NEGATIVE
Glucose, UA: NEGATIVE
Ketones, UA: NEGATIVE
Leukocytes, UA: NEGATIVE
Nitrite, UA: NEGATIVE
Protein, UA: NEGATIVE
Specific Gravity, UA: 1.019 (ref 1.005–1.030)
Urobilinogen, Ur: 0.2 mg/dL (ref 0.2–1.0)
pH, UA: 5.5 (ref 5.0–7.5)

## 2016-09-18 LAB — URINE CULTURE: Organism ID, Bacteria: NO GROWTH

## 2016-09-20 LAB — CYTOLOGY - PAP

## 2016-10-05 ENCOUNTER — Encounter: Payer: Self-pay | Admitting: Nurse Practitioner

## 2016-10-05 ENCOUNTER — Ambulatory Visit: Payer: Medicaid Other | Admitting: Nurse Practitioner

## 2016-10-05 ENCOUNTER — Telehealth: Payer: Self-pay | Admitting: Nurse Practitioner

## 2016-10-05 NOTE — Telephone Encounter (Signed)
Noted  

## 2016-10-05 NOTE — Telephone Encounter (Signed)
PT WAS A NO SHOW AND LETTER SENT  °

## 2016-10-18 ENCOUNTER — Other Ambulatory Visit (HOSPITAL_BASED_OUTPATIENT_CLINIC_OR_DEPARTMENT_OTHER): Payer: Self-pay

## 2016-10-18 DIAGNOSIS — R0683 Snoring: Secondary | ICD-10-CM

## 2016-10-20 ENCOUNTER — Other Ambulatory Visit: Payer: Self-pay

## 2016-10-20 ENCOUNTER — Ambulatory Visit (INDEPENDENT_AMBULATORY_CARE_PROVIDER_SITE_OTHER): Payer: Medicare Other | Admitting: Nurse Practitioner

## 2016-10-20 ENCOUNTER — Encounter: Payer: Self-pay | Admitting: Nurse Practitioner

## 2016-10-20 VITALS — BP 133/81 | HR 82 | Temp 98.3°F | Ht 62.0 in | Wt 304.6 lb

## 2016-10-20 DIAGNOSIS — R109 Unspecified abdominal pain: Secondary | ICD-10-CM

## 2016-10-20 DIAGNOSIS — R1033 Periumbilical pain: Secondary | ICD-10-CM | POA: Diagnosis not present

## 2016-10-20 NOTE — Assessment & Plan Note (Signed)
The patient notes an approximate two-month new onset abdominal pain. No other associated symptoms, specifically no fever, chills, unintentional weight loss, nausea, vomiting, diarrhea, acute changes in stool habits, worsening GERD, dysphagia. Seems to be self limited, vague abdominal discomfort. Moderate to severe in severity. The patient is somewhat of a poor historian and is aided by her healthcare aide and her mom. At this point I'll check a CBC, CMP, lipase. On exam her abdomen is protuberant and generally soft, but there is an appreciable area of increased firmness in the mid abdominal area. Unsure of this is excessive subcutaneous tissue or something else. I will proceed with an abdominal CT at this time to further evaluate. She does have a history of ovarian cyst and this could be related. Return for follow-up in 4-6 weeks.

## 2016-10-20 NOTE — Progress Notes (Signed)
Primary Care Physician:  Alonza Bogus, MD Primary Gastroenterologist:  Dr. Gala Romney  Chief Complaint  Patient presents with  . Abdominal Pain    x few months    HPI:   Tamara [name] is a 40 y.o. female who presents For evaluation of abdominal pain. Reviewed records and affect. Phone note by gynecological nurse practitioner dated 02/10/2016 notes patient aware of ultrasound and has a cyst on the left ovary which may come and go. Initial appointment dated 10/05/2016 the patient was a no-show.  Previous EGD in 2011 with small hiatal hernia, otherwise normal. She had a colonoscopy 03/15/2011 for new onset rectal bleeding which found friable anal canal, otherwise normal rectum and terminal ileum. Suspect benign anorectal bleeding.  Today she is accompanied by her mother, a small child, and an aide. Today she states he stomach started hurting couple months ago, is generalized/periumbilical. Pain is crampy, has a daily bowel movement. Pain is constant but seems to be intermittent in intensity, "hurts a whole lot." Hasn't complained to aide about needing to strain. Denies hematochezia. No improvement in abdominal pain with bowel movement. Denies injuries. Denies other abdominal pain, diarrhea, acute changes in bowel habits, unintentional weight loss. Denies GERD symptoms. She is on Prilosec daily. Denies dysphagia symptoms. Denies chest pain, dyspnea, dizziness, lightheadedness, syncope, near syncope. Denies any other upper or lower GI symptoms.  Past Medical History:  Diagnosis Date  . Abnormal uterine bleeding (AUB) 01/26/2016  . Anxiety and depression    mentally slow  . Chronic abdominal pain   . Depression   . Hematuria 01/26/2016  . Hypertension   . Leg pain   . Obesity   . Ovarian cyst 02/10/2016  . PUD (peptic ulcer disease)   . Reflux   . S/P colonoscopy 03/15/11   friable anal canal  . Sebaceous cyst of labia 01/26/2016  . Yeast vaginitis 09/19/2013    Past Surgical History:    Procedure Laterality Date  . TOOTH EXTRACTION N/A 06/03/2014   Procedure: EXTRACTION WISDOM TEETH;  Surgeon: Gae Bon, DDS;  Location: [facility];  Service: Oral Surgery;  Laterality: N/A;  . TUBAL LIGATION      Current Outpatient Prescriptions  Medication Sig Dispense Refill  . acetaminophen (TYLENOL) 325 MG tablet Take 650 mg by mouth 4 (four) times daily as needed.    Marland Kitchen allopurinol (ZYLOPRIM) 300 MG tablet Take 300 mg by mouth daily.     Marland Kitchen ALPRAZolam (XANAX) 0.5 MG tablet Take 0.5 mg by mouth 3 (three) times daily as needed. Anxiety    . amLODipine-benazepril (LOTREL) 10-40 MG capsule Take 1 capsule by mouth daily.    . cetirizine (ZYRTEC) 10 MG tablet Take 10 mg by mouth daily.    . cloNIDine (CATAPRES) 0.2 MG tablet Take 0.2 mg by mouth 2 (two) times daily.    Marland Kitchen COLCRYS 0.6 MG tablet Take 0.6 mg by mouth 2 (two) times daily.     . DULoxetine (CYMBALTA) 60 MG capsule Take 60 mg by mouth at bedtime.      . furosemide (LASIX) 40 MG tablet Take 40 mg by mouth daily.     Marland Kitchen HYDROcodone-acetaminophen (NORCO/VICODIN) 5-325 MG per tablet Take 1 tablet by mouth 3 (three) times daily between meals as needed.     . metoprolol (LOPRESSOR) 100 MG tablet 100 mg 2 (two) times daily.     . mupirocin ointment (BACTROBAN) 2 % Apply 1 application topically 2 (two) times daily.     Marland Kitchen  omeprazole (PRILOSEC) 20 MG capsule Take 20 mg by mouth daily.       No current facility-administered medications for this visit.     Allergies as of 10/20/2016  . (No Known Allergies)    Family History  Problem Relation Age of Onset  . Colon polyps Father   . Hypertension Father   . Diabetes Father   . Seizures Mother   . Hypertension Mother   . Diabetes Mother   . Diabetes Paternal Grandmother   . Colon cancer Neg Hx     Social History   Social History  . Marital status: Single    Spouse name: N/A  . Number of children: 0  . Years of education: N/A   Occupational History  . diabled Not Employed    Social History Main Topics  . Smoking status: Never Smoker  . Smokeless tobacco: Never Used  . Alcohol use No  . Drug use: No  . Sexual activity: Not Currently    Birth control/ protection: Surgical     Comment: tubal   Other Topics Concern  . Not on file   Social History Narrative  . No narrative on file    Review of Systems: General: Negative for anorexia, weight loss, fever, chills, fatigue, weakness. ENT: Negative for hoarseness, difficulty swallowing. CV: Negative for chest pain, angina, palpitations, peripheral edema.  Respiratory: Negative for dyspnea at rest, cough, sputum, wheezing.  GI: See history of present illness. MS: Negative for joint pain, low back pain.  Derm: Negative for rash or itching.  Endo: Negative for unusual weight change.  Heme: Negative for bruising or bleeding. Allergy: Negative for rash or hives.    Physical Exam: BP 133/81   Pulse 82   Temp 98.3 F (36.8 C) (Oral)   Ht 5\' 2"  (1.575 m)   Wt (!) 304 lb 9.6 oz (138.2 kg)   BMI 55.71 kg/m  General:   Obese female. Alert and oriented. Pleasant and cooperative. Well-nourished and well-developed.  Head:  Normocephalic and atraumatic. Eyes:  Without icterus, sclera clear and conjunctiva pink.  Ears:  Normal auditory acuity. Cardiovascular:  S1, S2 present without murmurs appreciated. Normal pulses noted. Extremities without clubbing or edema. Respiratory:  Clear to auscultation bilaterally. No wheezes, rales, or rhonchi. No distress.  Gastrointestinal:  +BS, obese but soft with possible mid-anterior abdominal area of increased firmness, non-distended. Mild to moderate TTP mid and periumbilical abdomen. No HSM noted. No guarding or rebound. No masses appreciated.  Rectal:  Deferred  Musculoskalatal:  Symmetrical without gross deformities. Neurologic:  Alert and oriented x4;  grossly normal neurologically. Psych:  Alert and cooperative. Normal mood and affect. Heme/Lymph/Immune: No excessive  bruising noted.    10/20/2016 3:11 PM   Disclaimer: This note was dictated with voice recognition software. Similar sounding words can inadvertently be transcribed and may not be corrected upon review.

## 2016-10-20 NOTE — Patient Instructions (Signed)
1. Your blood work drawn when you're able to. 2. We will work on scheduling your abdominal CAT scan. 3. Return for follow-up in 4-6 weeks.

## 2016-10-21 NOTE — Progress Notes (Signed)
cc'ed to pcp °

## 2016-10-25 ENCOUNTER — Ambulatory Visit: Payer: Medicare Other | Attending: Pulmonary Disease | Admitting: Neurology

## 2016-10-25 DIAGNOSIS — R0683 Snoring: Secondary | ICD-10-CM | POA: Diagnosis present

## 2016-10-25 DIAGNOSIS — G4733 Obstructive sleep apnea (adult) (pediatric): Secondary | ICD-10-CM | POA: Insufficient documentation

## 2016-10-29 ENCOUNTER — Ambulatory Visit (HOSPITAL_COMMUNITY): Payer: Medicare Other

## 2016-10-30 NOTE — Procedures (Signed)
Burley A. Merlene Laughter, MD     [email or web address]             NOCTURNAL POLYSOMNOGRAPHY   LOCATION: [facility]   Patient Name: Tamara [name], Tamara [name] Date: 10/25/2016 Gender: Female D.O.B: June 17, 1976 Age (years): 39 Referring Provider: Lennox Solders Height (inches): 62 Interpreting Physician: Phillips Odor MD, ABSM Weight (lbs): 306 RPSGT: Rosebud Poles BMI: 56 MRN: [id number] Neck Size: 22.00 CLINICAL INFORMATION Sleep Study Type: Split Night CPAP Indication for sleep study: Snoring Epworth Sleepiness Score: 15 SLEEP STUDY TECHNIQUE As per the AASM Manual for the Scoring of Sleep and Associated Events v2.3 (April 2016) with a hypopnea requiring 4% desaturations. The channels recorded and monitored were frontal, central and occipital EEG, electrooculogram (EOG), submentalis EMG (chin), nasal and oral airflow, thoracic and abdominal wall motion, anterior tibialis EMG, snore microphone, electrocardiogram, and pulse oximetry. Continuous positive airway pressure (CPAP) was initiated when the patient met split night criteria and was titrated according to treat sleep-disordered breathing. MEDICATIONS Medications self-administered by patient taken the night of the study : N/A  Current Outpatient Prescriptions:  .  acetaminophen (TYLENOL) 325 MG tablet, Take 650 mg by mouth 4 (four) times daily as needed., Disp: , Rfl:  .  allopurinol (ZYLOPRIM) 300 MG tablet, Take 300 mg by mouth daily. , Disp: , Rfl:  .  ALPRAZolam (XANAX) 0.5 MG tablet, Take 0.5 mg by mouth 3 (three) times daily as needed. Anxiety, Disp: , Rfl:  .  amLODipine-benazepril (LOTREL) 10-40 MG capsule, Take 1 capsule by mouth daily., Disp: , Rfl:  .  cetirizine (ZYRTEC) 10 MG tablet, Take 10 mg by mouth daily., Disp: , Rfl:  .  cloNIDine (CATAPRES) 0.2 MG tablet, Take 0.2 mg by mouth 2 (two) times daily., Disp: , Rfl:  .  COLCRYS 0.6 MG tablet, Take 0.6 mg by mouth 2 (two) times daily. , Disp: , Rfl:    .  DULoxetine (CYMBALTA) 60 MG capsule, Take 60 mg by mouth at bedtime.  , Disp: , Rfl:  .  furosemide (LASIX) 40 MG tablet, Take 40 mg by mouth daily. , Disp: , Rfl:  .  HYDROcodone-acetaminophen (NORCO/VICODIN) 5-325 MG per tablet, Take 1 tablet by mouth 3 (three) times daily between meals as needed. , Disp: , Rfl:  .  metoprolol (LOPRESSOR) 100 MG tablet, 100 mg 2 (two) times daily. , Disp: , Rfl:  .  mupirocin ointment (BACTROBAN) 2 %, Apply 1 application topically 2 (two) times daily. , Disp: , Rfl:  .  omeprazole (PRILOSEC) 20 MG capsule, Take 20 mg by mouth daily.  , Disp: , Rfl:   RESPIRATORY PARAMETERS Diagnostic Total AHI (/hr): 27.3 RDI (/hr): 27.3 OA Index (/hr): 0.4 CA Index (/hr): 0.0 REM AHI (/hr): N/A NREM AHI (/hr): 27.3 Supine AHI (/hr): 28.2 Non-supine AHI (/hr): 25.19 Min O2 Sat (%): 82.00 Mean O2 (%): 90.41 Time below 88% (min): 12.7   Titration Optimal Pressure (cm): 11 AHI at Optimal Pressure (/hr): 1.1 Min O2 at Optimal Pressure (%): 84.0 Supine % at Optimal (%): 100 Sleep % at Optimal (%): 98   SLEEP ARCHITECTURE The recording time for the entire night was 387.1 minutes. During a baseline period of 191.4 minutes, the patient slept for 134.0 minutes in REM and nonREM, yielding a sleep efficiency of 70.0%. Sleep onset after lights out was 8.2 minutes with a REM latency of N/A minutes. The patient spent 6.72% of the night in stage N1 sleep, 55.22% in stage N2 sleep, 38.06% in stage N3  and 0.00% in REM. During the titration period of 193.8 minutes, the patient slept for 138.5 minutes in REM and nonREM, yielding a sleep efficiency of 71.5%. Sleep onset after CPAP initiation was 48.3 minutes with a REM latency of 83.0 minutes. The patient spent 2.89% of the night in stage N1 sleep, 37.91% in stage N2 sleep, 14.80% in stage N3 and 44.40% in REM. CARDIAC DATA The 2 lead EKG demonstrated sinus rhythm. The mean heart rate was N/A beats per minute. Other EKG findings include:  None. LEG MOVEMENT DATA The total Periodic Limb Movements of Sleep (PLMS) were 0. The PLMS index was 0.00.   IMPRESSIONS - Moderate obstructive sleep apnea occurred during the diagnostic portion of the study(AHI = 27.3/hour). An optimal CPAP pressure was selected for this patient ( 11 cm of water).   Delano Metz, MD Diplomate, American Board of Sleep Medicine.

## 2016-11-01 ENCOUNTER — Encounter (HOSPITAL_COMMUNITY): Payer: Self-pay

## 2016-11-01 ENCOUNTER — Ambulatory Visit (HOSPITAL_COMMUNITY)
Admission: RE | Admit: 2016-11-01 | Discharge: 2016-11-01 | Disposition: A | Discharge status: Home / Self Care | Payer: Medicare Other | Source: Ambulatory Visit | Attending: Nurse Practitioner | Admitting: Nurse Practitioner

## 2016-11-01 DIAGNOSIS — R109 Unspecified abdominal pain: Secondary | ICD-10-CM

## 2016-11-01 MED ORDER — IOPAMIDOL (ISOVUE-300) INJECTION 61%
100.0000 mL | Freq: Once | INTRAVENOUS | Status: AC | PRN
  Administered 2016-11-01: 100 mL via INTRAVENOUS

## 2016-11-30 ENCOUNTER — Ambulatory Visit: Payer: Medicare Other | Admitting: Internal Medicine

## 2017-01-04 ENCOUNTER — Ambulatory Visit: Payer: Medicare Other | Admitting: Internal Medicine

## 2017-01-18 ENCOUNTER — Ambulatory Visit: Payer: Medicare Other | Admitting: Internal Medicine

## 2017-01-21 ENCOUNTER — Ambulatory Visit: Payer: Medicare Other | Admitting: Internal Medicine

## 2017-01-25 DIAGNOSIS — I10 Essential (primary) hypertension: Secondary | ICD-10-CM | POA: Diagnosis not present

## 2017-01-25 DIAGNOSIS — K210 Gastro-esophageal reflux disease with esophagitis: Secondary | ICD-10-CM | POA: Diagnosis not present

## 2017-02-08 ENCOUNTER — Encounter: Payer: Self-pay | Admitting: Internal Medicine

## 2017-02-08 ENCOUNTER — Ambulatory Visit (INDEPENDENT_AMBULATORY_CARE_PROVIDER_SITE_OTHER): Payer: Medicare Other | Admitting: Internal Medicine

## 2017-02-08 ENCOUNTER — Other Ambulatory Visit: Payer: Self-pay

## 2017-02-08 VITALS — BP 126/88 | HR 72 | Temp 98.0°F | Ht 62.0 in | Wt 308.0 lb

## 2017-02-08 DIAGNOSIS — R109 Unspecified abdominal pain: Secondary | ICD-10-CM

## 2017-02-08 DIAGNOSIS — N926 Irregular menstruation, unspecified: Secondary | ICD-10-CM | POA: Diagnosis not present

## 2017-02-08 DIAGNOSIS — K219 Gastro-esophageal reflux disease without esophagitis: Secondary | ICD-10-CM

## 2017-02-08 DIAGNOSIS — R1033 Periumbilical pain: Secondary | ICD-10-CM | POA: Diagnosis not present

## 2017-02-08 DIAGNOSIS — R1030 Lower abdominal pain, unspecified: Secondary | ICD-10-CM | POA: Diagnosis not present

## 2017-02-08 LAB — CBC WITH DIFFERENTIAL/PLATELET
Basophils Absolute: 0 cells/uL (ref 0–200)
Basophils Relative: 0 %
Eosinophils Absolute: 85 cells/uL (ref 15–500)
Eosinophils Relative: 1 %
HCT: 36.8 % (ref 35.0–45.0)
Hemoglobin: 12.3 g/dL (ref 11.7–15.5)
Lymphocytes Relative: 51 %
Lymphs Abs: 4335 cells/uL — ABNORMAL HIGH (ref 850–3900)
MCH: 30.1 pg (ref 27.0–33.0)
MCHC: 33.4 g/dL (ref 32.0–36.0)
MCV: 90 fL (ref 80.0–100.0)
MPV: 9.4 fL (ref 7.5–12.5)
Monocytes Absolute: 510 cells/uL (ref 200–950)
Monocytes Relative: 6 %
Neutro Abs: 3570 cells/uL (ref 1500–7800)
Neutrophils Relative %: 42 %
Platelets: 383 10*3/uL (ref 140–400)
RBC: 4.09 MIL/uL (ref 3.80–5.10)
RDW: 14.7 % (ref 11.0–15.0)
WBC: 8.5 10*3/uL (ref 3.8–10.8)

## 2017-02-08 LAB — TSH: TSH: 4.2 mIU/L

## 2017-02-08 NOTE — Progress Notes (Signed)
Primary Care Physician:  Alonza Bogus, MD Primary Gastroenterologist:  Dr. Gala Romney  Pre-Procedure History & Physical: HPI:  Tamara [name] is a 41 y.o. female here for follow-up evaluation of abdominal pain. See her back in October of last year. CT scan pelvis demonstrate a thickened uterus. No other significant findings. Extensive lab work was ordered but has not yet been done. Patient to remains morbidly obese. Patient's caregiver tells me bariatric surgery is being discussed.  GERD symptoms well controlled on Prilosec 20 mg daily.  Patient denies any bowel issues these days. Specifically, denies constipation, diarrhea melena or hematochezia. Appetite is good. No odynophagia, dysphagia or early Saturday nausea or vomiting.  Caregiver wonders about her thyroid status. I did review the records and don't see a recent TSH going back at least several years.  Patient's symptoms nowadays are that of vaginal spotting and suprapubic pain intermittently. Again, uterus abnormal on CT and prior pelvic ultrasound.  I'm told that she is scheduled to see the gynecologist back in June of this year.  Past Medical History:  Diagnosis Date  . Abnormal uterine bleeding (AUB) 01/26/2016  . Anxiety and depression    mentally slow  . Chronic abdominal pain   . Depression   . Hematuria 01/26/2016  . Hypertension   . Leg pain   . Obesity   . Ovarian cyst 02/10/2016  . PUD (peptic ulcer disease)   . Reflux   . S/P colonoscopy 03/15/11   friable anal canal  . Sebaceous cyst of labia 01/26/2016  . Yeast vaginitis 09/19/2013    Past Surgical History:  Procedure Laterality Date  . TOOTH EXTRACTION N/A 06/03/2014   Procedure: EXTRACTION WISDOM TEETH;  Surgeon: Gae Bon, DDS;  Location: [facility];  Service: Oral Surgery;  Laterality: N/A;  . TUBAL LIGATION      Prior to Admission medications   Medication Sig Start Date End Date Taking? Authorizing Provider  acetaminophen (TYLENOL) 325 MG tablet Take  650 mg by mouth 4 (four) times daily as needed.   Yes Historical Provider, MD  allopurinol (ZYLOPRIM) 300 MG tablet Take 300 mg by mouth daily.  09/05/13  Yes Historical Provider, MD  ALPRAZolam Duanne Moron) 0.5 MG tablet Take 0.5 mg by mouth 3 (three) times daily as needed. Anxiety   Yes Historical Provider, MD  amLODipine-benazepril (LOTREL) 10-40 MG capsule Take 1 capsule by mouth daily.   Yes Historical Provider, MD  cetirizine (ZYRTEC) 10 MG tablet Take 10 mg by mouth daily.   Yes Historical Provider, MD  cloNIDine (CATAPRES) 0.2 MG tablet Take 0.2 mg by mouth 2 (two) times daily.   Yes Historical Provider, MD  COLCRYS 0.6 MG tablet Take 0.6 mg by mouth 2 (two) times daily.  09/15/13  Yes Historical Provider, MD  DULoxetine (CYMBALTA) 60 MG capsule Take 60 mg by mouth at bedtime.     Yes Historical Provider, MD  hydrochlorothiazide (MICROZIDE) 12.5 MG capsule Take 12.5 mg by mouth daily.   Yes Historical Provider, MD  HYDROcodone-acetaminophen (NORCO/VICODIN) 5-325 MG per tablet Take 1 tablet by mouth 3 (three) times daily between meals as needed.  12/13/14  Yes Historical Provider, MD  metoprolol (LOPRESSOR) 100 MG tablet 100 mg 2 (two) times daily.  11/29/14  Yes Historical Provider, MD  omeprazole (PRILOSEC) 20 MG capsule Take 20 mg by mouth daily.     Yes Historical Provider, MD  furosemide (LASIX) 40 MG tablet Take 40 mg by mouth daily.  09/15/13   Historical Provider,  MD  mupirocin ointment (BACTROBAN) 2 % Apply 1 application topically 2 (two) times daily.  06/10/16   Historical Provider, MD    Allergies as of 02/08/2017  . (No Known Allergies)    Family History  Problem Relation Age of Onset  . Colon polyps Father   . Hypertension Father   . Diabetes Father   . Seizures Mother   . Hypertension Mother   . Diabetes Mother   . Diabetes Paternal Grandmother   . Colon cancer Neg Hx     Social History   Social History  . Marital status: Single    Spouse name: N/A  . Number of  children: 0  . Years of education: N/A   Occupational History  . diabled Not Employed   Social History Main Topics  . Smoking status: Never Smoker  . Smokeless tobacco: Never Used  . Alcohol use No  . Drug use: No  . Sexual activity: Not Currently    Birth control/ protection: Surgical     Comment: tubal   Other Topics Concern  . Not on file   Social History Narrative  . No narrative on file    Review of Systems: See HPI, otherwise negative ROS  Physical Exam: BP 126/88   Pulse 72   Temp 98 F (36.7 C) (Oral)   Ht 5\' 2"  (1.575 m)   Wt (!) 308 lb (139.7 kg)   LMP 01/31/2017 (Approximate) Comment: spotting  BMI 56.33 kg/m  General:   Alert,  Pleasant and cooperative in NAD. Mentally challenged. Caregiver is in attendance and provides some of the history. Heart:  Regular rate and rhythm; no murmurs, clicks, rubs,  or gallops. Abdomen: Significantly obese positive bowel sounds. Soft with some suprapubic tenderness. No obvious mass or organomegaly. Pulses:  Normal pulses noted. Extremities:  Without clubbing or edema.  Impression:  Pleasant 41 year old mentally challenged lady who is really doing well from a GI standpoint at this time.   Her suprapubic pubic discomfort,, vaginal spotting and abnormal appearance of the uterosacral imaging are probably all related.  GERD well controlled on current regimen.  Recommendations:  We will attempt to move gynecology appointment up to be seen sooner  Get labs done previously ordered (will add on a TSH) for completion.  Continue Prilosec 20 mg daily.  Gradually increase exercise and watch caloric intake  Further recommendations to follow  OV in 6 months       Notice: This dictation was prepared with Dragon dictation along with smaller phrase technology. Any transcriptional errors that result from this process are unintentional and may not be corrected upon review.

## 2017-02-08 NOTE — Patient Instructions (Signed)
We will move gynecology appointment to be seen sooner  Get labs done previously ordered (will add on a TSH)  Gradually increase exercise and watch caloric intake  Further recommendations to follow  OV in 6 months

## 2017-02-09 ENCOUNTER — Encounter: Payer: Self-pay | Admitting: Obstetrics and Gynecology

## 2017-02-09 ENCOUNTER — Ambulatory Visit (INDEPENDENT_AMBULATORY_CARE_PROVIDER_SITE_OTHER): Payer: Medicare Other | Admitting: Obstetrics and Gynecology

## 2017-02-09 VITALS — BP 118/78 | HR 85 | Wt 315.0 lb

## 2017-02-09 DIAGNOSIS — N938 Other specified abnormal uterine and vaginal bleeding: Secondary | ICD-10-CM

## 2017-02-09 DIAGNOSIS — Z6841 Body Mass Index (BMI) 40.0 and over, adult: Secondary | ICD-10-CM

## 2017-02-09 DIAGNOSIS — N939 Abnormal uterine and vaginal bleeding, unspecified: Secondary | ICD-10-CM

## 2017-02-09 LAB — COMPREHENSIVE METABOLIC PANEL
ALT: 15 U/L (ref 6–29)
AST: 13 U/L (ref 10–30)
Albumin: 4 g/dL (ref 3.6–5.1)
Alkaline Phosphatase: 63 U/L (ref 33–115)
BUN: 9 mg/dL (ref 7–25)
CO2: 30 mmol/L (ref 20–31)
Calcium: 8.9 mg/dL (ref 8.6–10.2)
Chloride: 103 mmol/L (ref 98–110)
Creat: 0.99 mg/dL (ref 0.50–1.10)
Glucose, Bld: 106 mg/dL — ABNORMAL HIGH (ref 65–99)
Potassium: 3.6 mmol/L (ref 3.5–5.3)
Sodium: 141 mmol/L (ref 135–146)
Total Bilirubin: 0.4 mg/dL (ref 0.2–1.2)
Total Protein: 6.1 g/dL (ref 6.1–8.1)

## 2017-02-09 LAB — LIPASE: Lipase: 12 U/L (ref 7–60)

## 2017-02-09 MED ORDER — MEDROXYPROGESTERONE ACETATE 10 MG PO TABS: 10.0000 mg | ORAL_TABLET | Freq: Every day | ORAL | 3 refills | Status: DC

## 2017-02-09 NOTE — Progress Notes (Addendum)
[facility] Visit  02/09/2017           Patient name: Tamara [name] MRN [id number]  Date of birth: [birth year]  CC & HPI:  Tamara [name] is a 41 y.o. female presenting today for follow up concerning vaginal spotting;  sent by her PCP, Dr. Gala Romney. Per Dr, Roseanne Kaufman note on 02/08/2017 patient was complaining of vaginal spotting and suprapubic pain intermittently. Uterus was appeared to be thickened on CT and prior pelvic ultrasound. Pt states she notices the blood more often when she goes to urinate. No alleviating factors noted. She has no other acute complaints at this time.  ROS:  ROS Otherwise negative for acute change except as noted in the HPI.  Pertinent History Reviewed:   Reviewed: Significant for abnormal uterine bleeding Medical         Past Medical History:  Diagnosis Date   Abnormal uterine bleeding (AUB) 01/26/2016   Anxiety and depression    mentally slow   Chronic abdominal pain    Depression    Hematuria 01/26/2016   Hypertension    Leg pain    Obesity    Ovarian cyst 02/10/2016   PUD (peptic ulcer disease)    Reflux    S/P colonoscopy 03/15/11   friable anal canal   Sebaceous cyst of labia 01/26/2016   Yeast vaginitis 09/19/2013                              Surgical Hx:    Past Surgical History:  Procedure Laterality Date   TOOTH EXTRACTION N/A 06/03/2014   Procedure: EXTRACTION WISDOM TEETH;  Surgeon: Gae Bon, DDS;  Location: [facility];  Service: Oral Surgery;  Laterality: N/A;   TUBAL LIGATION     Medications: Reviewed & Updated - see associated section                       Current Outpatient Prescriptions:    acetaminophen (TYLENOL) 325 MG tablet, Take 650 mg by mouth 4 (four) times daily as needed., Disp: , Rfl:    allopurinol (ZYLOPRIM) 300 MG tablet, Take 300 mg by mouth daily. , Disp: , Rfl:    ALPRAZolam (XANAX) 0.5 MG tablet, Take 0.5 mg by mouth 3 (three) times daily as needed. Anxiety, Disp: , Rfl:     amLODipine-benazepril (LOTREL) 10-40 MG capsule, Take 1 capsule by mouth daily., Disp: , Rfl:    cetirizine (ZYRTEC) 10 MG tablet, Take 10 mg by mouth daily., Disp: , Rfl:    cloNIDine (CATAPRES) 0.2 MG tablet, Take 0.2 mg by mouth 2 (two) times daily., Disp: , Rfl:    COLCRYS 0.6 MG tablet, Take 0.6 mg by mouth 2 (two) times daily. , Disp: , Rfl:    DULoxetine (CYMBALTA) 60 MG capsule, Take 60 mg by mouth at bedtime.  , Disp: , Rfl:    furosemide (LASIX) 40 MG tablet, Take 40 mg by mouth daily. , Disp: , Rfl:    hydrochlorothiazide (MICROZIDE) 12.5 MG capsule, Take 12.5 mg by mouth daily., Disp: , Rfl:    HYDROcodone-acetaminophen (NORCO/VICODIN) 5-325 MG per tablet, Take 1 tablet by mouth 3 (three) times daily between meals as needed. , Disp: , Rfl:    metoprolol (LOPRESSOR) 100 MG tablet, 100 mg 2 (two) times daily. , Disp: , Rfl:    mupirocin ointment (BACTROBAN) 2 %, Apply 1 application topically 2 (two) times daily. ,  Disp: , Rfl:    omeprazole (PRILOSEC) 20 MG capsule, Take 20 mg by mouth daily.  , Disp: , Rfl:    Social History: Reviewed -  reports that she has never smoked. She has never used smokeless tobacco.  Objective Findings:  Vitals: Last menstrual period 01/31/2017.  Physical Examination: General appearance - alert, well appearing, and in no distress Pelvic -  VULVA: mulitple comedones on external labia  VAGINA: normal appearing vagina with normal color  CERVIX: normal appearing cervix without discharge or lesions, light blood at the cervix UTERUS: uterus is normal size, shape, consistency and nontender,  ADNEXA: normal adnexa in size, nontender and no masses   Assessment & Plan:   A:  1. Probable Anovulation 2. AUB 3. Minimal endometrial thickening  4. Morbid Obesity 5 limited mental function P:  1. Will start on Provera 10 mg a day x 14 days  2. Follow up in 4 weeks for Pelvic US     By signing my name below, I, Evelene Croon, attest that this  documentation has been prepared under the direction and in the presence of [name], MD . Electronically Signed: Evelene Croon, Scribe. 02/09/2017. 3:47 PM. I personally performed the services described in this documentation, which was SCRIBED in my presence. The recorded information has been reviewed and considered accurate. It has been edited as necessary during review. [name], MD

## 2017-02-15 NOTE — Progress Notes (Signed)
Tamara [name] Visit  02/09/2017           Patient name: Tamara [name] MRN [id number]  Date of birth: [birth year]  CC & HPI:  Tamara [name] is a 41 y.o. female presenting today for follow up concerning vaginal spotting;  sent by her PCP, Tamara [name]. Per Tamara [name] note on 02/08/2017 patient was complaining of vaginal spotting and suprapubic pain intermittently. Uterus was appeared to be thickened on CT and prior pelvic ultrasound. Pt states she notices the blood more often when she goes to urinate. No alleviating factors noted. She has no other acute complaints at this time.  ROS:  ROS Otherwise negative for acute change except as noted in the HPI.  Pertinent History Reviewed:   Reviewed: Significant for abnormal uterine bleeding Medical         Past Medical History:  Diagnosis Date  . Abnormal uterine bleeding (AUB) 01/26/2016  . Anxiety and depression    mentally slow  . Chronic abdominal pain   . Depression   . Hematuria 01/26/2016  . Hypertension   . Leg pain   . Obesity   . Ovarian cyst 02/10/2016  . PUD (peptic ulcer disease)   . Reflux   . S/P colonoscopy 03/15/11   friable anal canal  . Sebaceous cyst of labia 01/26/2016  . Yeast vaginitis 09/19/2013                              Surgical Hx:    Past Surgical History:  Procedure Laterality Date  . TOOTH EXTRACTION N/A 06/03/2014   Procedure: EXTRACTION WISDOM TEETH;  Surgeon: Gae Bon, DDS;  Location: [place];  Service: Oral Surgery;  Laterality: N/A;  . TUBAL LIGATION     Medications: Reviewed & Updated - see associated section                       Current Outpatient Prescriptions:  .  acetaminophen (TYLENOL) 325 MG tablet, Take 650 mg by mouth 4 (four) times daily as needed., Disp: , Rfl:  .  allopurinol (ZYLOPRIM) 300 MG tablet, Take 300 mg by mouth daily. , Disp: , Rfl:  .  ALPRAZolam (XANAX) 0.5 MG tablet, Take 0.5 mg by mouth 3 (three) times daily as needed. Anxiety, Disp: , Rfl:  .  amLODipine-benazepril  (LOTREL) 10-40 MG capsule, Take 1 capsule by mouth daily., Disp: , Rfl:  .  cetirizine (ZYRTEC) 10 MG tablet, Take 10 mg by mouth daily., Disp: , Rfl:  .  cloNIDine (CATAPRES) 0.2 MG tablet, Take 0.2 mg by mouth 2 (two) times daily., Disp: , Rfl:  .  COLCRYS 0.6 MG tablet, Take 0.6 mg by mouth 2 (two) times daily. , Disp: , Rfl:  .  DULoxetine (CYMBALTA) 60 MG capsule, Take 60 mg by mouth at bedtime.  , Disp: , Rfl:  .  furosemide (LASIX) 40 MG tablet, Take 40 mg by mouth daily. , Disp: , Rfl:  .  hydrochlorothiazide (MICROZIDE) 12.5 MG capsule, Take 12.5 mg by mouth daily., Disp: , Rfl:  .  HYDROcodone-acetaminophen (NORCO/VICODIN) 5-325 MG per tablet, Take 1 tablet by mouth 3 (three) times daily between meals as needed. , Disp: , Rfl:  .  metoprolol (LOPRESSOR) 100 MG tablet, 100 mg 2 (two) times daily. , Disp: , Rfl:  .  mupirocin ointment (BACTROBAN) 2 %, Apply 1 application topically 2 (two) times daily. ,  Disp: , Rfl:  .  omeprazole (PRILOSEC) 20 MG capsule, Take 20 mg by mouth daily.  , Disp: , Rfl:    Social History: Reviewed -  reports that she has never smoked. She has never used smokeless tobacco.  Objective Findings:  Vitals: Last menstrual period 01/31/2017.  Physical Examination: General appearance - alert, well appearing, and in no distress Pelvic -  VULVA: mulitple comedones on external labia  VAGINA: normal appearing vagina with normal color  CERVIX: normal appearing cervix without discharge or lesions, light blood at the cervix UTERUS: uterus is normal size, shape, consistency and nontender,  ADNEXA: normal adnexa in size, nontender and no masses   Assessment & Plan:   A:  1. Probable Anovulation 2. AUB 3. Minimal endometrial thickening  4. Morbid Obesity 5 limited mental function P:  1. Will start on Provera 10 mg a day x 14 days  2. Follow up in 4 weeks for Pelvic US     By signing my name below, I, Tamara [name], attest that this documentation has been  prepared under the direction and in the presence of Tamara Kind, MD . Electronically Signed: Evelene [name], Scribe. 02/09/2017. 3:47 PM. I personally performed the services described in this documentation, which was SCRIBED in my presence. The recorded information has been reviewed and considered accurate. It has been edited as necessary during review. Tamara Kind, MD

## 2017-02-24 ENCOUNTER — Encounter: Payer: Self-pay | Admitting: General Surgery

## 2017-02-24 ENCOUNTER — Ambulatory Visit (INDEPENDENT_AMBULATORY_CARE_PROVIDER_SITE_OTHER): Payer: Medicare Other | Admitting: General Surgery

## 2017-02-24 VITALS — BP 153/83 | HR 81 | Temp 96.9°F | Ht 63.0 in | Wt 314.0 lb

## 2017-02-24 DIAGNOSIS — L723 Sebaceous cyst: Secondary | ICD-10-CM | POA: Diagnosis not present

## 2017-02-24 NOTE — Progress Notes (Signed)
Subjective:     Patient ID: Tamara [name], female   DOB: 05-Feb-1976, 41 y.o.   MRN: [id number]  HPI patient is a 41 year old morbidly obese black female who presents with skin lesions on the back of her neck. They have been present for many years. It occasionally drain yellow fluid. She has been on various antibiotics in the past. It seems to irritate her most when she is bathing. She states her pain is 10 out of 10, but is not in any pain at the present time. No fever or chills were noted. She was referred by Dr. Luan Pulling for surgical evaluation. She has not seen a dermatologist.  Past Medical History:  Diagnosis Date  . Abnormal uterine bleeding (AUB) 01/26/2016  . Anxiety and depression    mentally slow  . Chronic abdominal pain   . Depression   . Hematuria 01/26/2016  . Hypertension   . Leg pain   . Obesity   . Ovarian cyst 02/10/2016  . PUD (peptic ulcer disease)   . Reflux   . S/P colonoscopy 03/15/11   friable anal canal  . Sebaceous cyst of labia 01/26/2016  . Yeast vaginitis 09/19/2013    Past Surgical History:  Procedure Laterality Date  . TOOTH EXTRACTION N/A 06/03/2014   Procedure: EXTRACTION WISDOM TEETH;  Surgeon: Gae Bon, DDS;  Location: [facility];  Service: Oral Surgery;  Laterality: N/A;  . TUBAL LIGATION      Family History  Problem Relation Age of Onset  . Colon polyps Father   . Hypertension Father   . Diabetes Father   . Seizures Mother   . Hypertension Mother   . Diabetes Mother   . Diabetes Paternal Grandmother   . Colon cancer Neg Hx     Current Outpatient Prescriptions on File Prior to Visit  Medication Sig Dispense Refill  . acetaminophen (TYLENOL) 325 MG tablet Take 650 mg by mouth 4 (four) times daily as needed.    Marland Kitchen allopurinol (ZYLOPRIM) 300 MG tablet Take 300 mg by mouth daily.     Marland Kitchen ALPRAZolam (XANAX) 0.5 MG tablet Take 0.5 mg by mouth 3 (three) times daily as needed. Anxiety    . amLODipine-benazepril (LOTREL) 10-40 MG capsule Take 1 capsule  by mouth daily.    . cetirizine (ZYRTEC) 10 MG tablet Take 10 mg by mouth daily.    . cloNIDine (CATAPRES) 0.2 MG tablet Take 0.2 mg by mouth 3 (three) times daily.     Marland Kitchen COLCRYS 0.6 MG tablet Take 0.6 mg by mouth 2 (two) times daily.     . DULoxetine (CYMBALTA) 60 MG capsule Take 60 mg by mouth at bedtime.      . furosemide (LASIX) 40 MG tablet Take 40 mg by mouth daily.     . hydrochlorothiazide (MICROZIDE) 12.5 MG capsule Take 12.5 mg by mouth daily.    Marland Kitchen HYDROcodone-acetaminophen (NORCO/VICODIN) 5-325 MG per tablet Take 1 tablet by mouth 4 (four) times daily.     Marland Kitchen lactulose (CHRONULAC) 10 GM/15ML solution Take 20 g by mouth at bedtime.    . medroxyPROGESTERone (PROVERA) 10 MG tablet Take 1 tablet (10 mg total) by mouth daily. One tablet daily x 2 weeks. Repeat as directed 14 tablet 3  . metoprolol (LOPRESSOR) 100 MG tablet 100 mg 2 (two) times daily.     . mupirocin ointment (BACTROBAN) 2 % Apply 1 application topically 2 (two) times daily.     Marland Kitchen omeprazole (PRILOSEC) 20 MG capsule Take 20 mg  by mouth daily.       No current facility-administered medications on file prior to visit.     No Known Allergies  History  Alcohol Use No    History  Smoking Status  . Never Smoker  Smokeless Tobacco  . Never Used    Vitals:   02/24/17 1032  BP: (!) 153/83  Pulse: 81  Temp: (!) 96.9 F (36.1 C)    Review of Systems  Constitutional: Negative.   HENT: Negative.   Eyes: Negative.   Respiratory: Negative.   Cardiovascular: Negative.   Gastrointestinal: Positive for abdominal pain.  Endocrine: Negative.   Genitourinary: Negative.   Musculoskeletal: Negative.   Skin: Positive for wound.  Allergic/Immunologic: Negative.   Neurological: Negative.   Hematological: Negative.   Psychiatric/Behavioral: Negative.        Objective:   Physical Exam  Constitutional: She is oriented to person, place, and time. She appears well-developed and well-nourished. No distress.  HENT:   Head: Normocephalic and atraumatic.  Neck: Normal range of motion. Neck supple.  Cardiovascular: Normal rate, regular rhythm and normal heart sounds.   Pulmonary/Chest: Effort normal and breath sounds normal. She has no wheezes. She has no rales.  Neurological: She is alert and oriented to person, place, and time.  Skin: Skin is warm and dry.  Linear area of granulomatous, scarred skin along the base of the neck transversely. No active drainage noted. It is irritated, but not particularly tender to touch. Small punctum's are present. An area extends approximately 6-7 cm in length. No induration noted.  Vitals reviewed.  Dr. Luan Pulling note reviewed.     Assessment:     Sebaceous cysts, chronic, neck    Plan:     I did offer surgical excision, full thickness of skin, along the base of the neck. The risks of the surgery including bleeding, infection, and recurrence of the cyst were fully explained to the patient and family. We did discuss possible dermatologic input prior to any surgical intervention. They would like to think about it. They will call back if they elect to proceed with surgery.

## 2017-02-24 NOTE — Patient Instructions (Signed)
Epidermal Cyst An epidermal cyst is a small, painless lump under your skin. It may be called an epidermal inclusion cyst or an infundibular cyst. The cyst contains a grayish-white, bad-smelling substance (keratin). It is important not to pop epidermal cysts yourself. These cysts are usually harmless (benign), but they can get infected. Symptoms of infection may include:  Redness.  Inflammation.  Tenderness.  Warmth.  Fever.  A grayish-white, bad-smelling substance draining from the cyst.  Pus draining from the cyst. Follow these instructions at home:  Take over-the-counter and prescription medicines only as told by your doctor.  If you were prescribed an antibiotic, use it as told by your doctor. Do not stop using the antibiotic even if you start to feel better.  Keep the area around your cyst clean and dry.  Wear loose, dry clothing.  Do not try to pop your cyst.  Avoid touching your cyst.  Check your cyst every day for signs of infection.  Keep all follow-up visits as told by your doctor. This is important. How is this prevented?  Wear clean, dry, clothing.  Avoid wearing tight clothing.  Keep your skin clean and dry. Shower or take baths every day.  Wash your body with a benzoyl peroxide wash when you shower or bathe. Contact a health care provider if:  Your cyst has symptoms of infection.  Your condition is not improving or is getting worse.  You have a cyst that looks different from other cysts you have had.  You have a fever. Get help right away if:  Redness spreads from the cyst into the surrounding area. This information is not intended to replace advice given to you by your health care provider. Make sure you discuss any questions you have with your health care provider. Document Released: 01/20/2005 Document Revised: 08/11/2016 Document Reviewed: 10/15/2015 Elsevier Interactive Patient Education  2017 [employer].

## 2017-03-08 ENCOUNTER — Other Ambulatory Visit: Payer: Self-pay | Admitting: Obstetrics and Gynecology

## 2017-03-08 DIAGNOSIS — N939 Abnormal uterine and vaginal bleeding, unspecified: Secondary | ICD-10-CM

## 2017-03-09 ENCOUNTER — Other Ambulatory Visit: Payer: Medicare Other

## 2017-03-14 ENCOUNTER — Other Ambulatory Visit: Payer: Medicare Other

## 2017-03-16 ENCOUNTER — Other Ambulatory Visit: Payer: Medicare Other

## 2017-03-22 ENCOUNTER — Other Ambulatory Visit: Payer: Self-pay | Admitting: Obstetrics and Gynecology

## 2017-03-22 ENCOUNTER — Ambulatory Visit (INDEPENDENT_AMBULATORY_CARE_PROVIDER_SITE_OTHER): Payer: Medicare Other

## 2017-03-22 ENCOUNTER — Encounter (INDEPENDENT_AMBULATORY_CARE_PROVIDER_SITE_OTHER): Payer: Self-pay

## 2017-03-22 DIAGNOSIS — N939 Abnormal uterine and vaginal bleeding, unspecified: Secondary | ICD-10-CM | POA: Diagnosis not present

## 2017-03-22 DIAGNOSIS — N83202 Unspecified ovarian cyst, left side: Secondary | ICD-10-CM

## 2017-03-22 NOTE — Progress Notes (Signed)
PELVIC US TA/TV: homogeneous anteverted uterus,wnl,EEC 6.7 mm,right ovary was not visualized,right adnexa wnl,two simple left ovarian cysts(#1) 3.1 x 2.5 x 3.1 cm,(#2) 2.6 x 2.7 x 3.3 cm,arterial and venous flow was visualized in left ovary,no free fluid, left ovary appears mobile,no pain during ultrasound

## 2017-04-18 ENCOUNTER — Telehealth: Payer: Self-pay | Admitting: Obstetrics and Gynecology

## 2017-04-18 NOTE — Telephone Encounter (Signed)
Informed patient that per Dr Brynda Greathouse impression, U/S only showed 2 simple cysts. Will discuss with dr Glo Herring and return call.

## 2017-04-19 NOTE — Telephone Encounter (Signed)
Informed patient that per Dr Glo Herring, cyst will should resolve on its own.

## 2017-06-23 DIAGNOSIS — K210 Gastro-esophageal reflux disease with esophagitis: Secondary | ICD-10-CM | POA: Diagnosis not present

## 2017-06-23 DIAGNOSIS — I10 Essential (primary) hypertension: Secondary | ICD-10-CM | POA: Diagnosis not present

## 2017-06-30 ENCOUNTER — Encounter: Payer: Self-pay | Admitting: Internal Medicine

## 2017-08-26 DIAGNOSIS — G4733 Obstructive sleep apnea (adult) (pediatric): Secondary | ICD-10-CM | POA: Diagnosis not present

## 2017-09-25 DIAGNOSIS — G4733 Obstructive sleep apnea (adult) (pediatric): Secondary | ICD-10-CM | POA: Diagnosis not present

## 2017-10-05 DIAGNOSIS — I10 Essential (primary) hypertension: Secondary | ICD-10-CM | POA: Diagnosis not present

## 2017-10-05 DIAGNOSIS — K210 Gastro-esophageal reflux disease with esophagitis: Secondary | ICD-10-CM | POA: Diagnosis not present

## 2017-10-26 DIAGNOSIS — G4733 Obstructive sleep apnea (adult) (pediatric): Secondary | ICD-10-CM | POA: Diagnosis not present

## 2017-11-01 DIAGNOSIS — R0902 Hypoxemia: Secondary | ICD-10-CM | POA: Diagnosis not present

## 2017-11-01 DIAGNOSIS — I10 Essential (primary) hypertension: Secondary | ICD-10-CM | POA: Diagnosis not present

## 2017-11-01 DIAGNOSIS — G4733 Obstructive sleep apnea (adult) (pediatric): Secondary | ICD-10-CM | POA: Diagnosis not present

## 2017-11-11 ENCOUNTER — Ambulatory Visit: Payer: Medicare Other | Admitting: Adult Health

## 2017-11-11 ENCOUNTER — Encounter: Payer: Self-pay | Admitting: *Deleted

## 2017-11-25 DIAGNOSIS — G4733 Obstructive sleep apnea (adult) (pediatric): Secondary | ICD-10-CM | POA: Diagnosis not present

## 2017-11-26 DIAGNOSIS — I10 Essential (primary) hypertension: Secondary | ICD-10-CM | POA: Diagnosis not present

## 2017-11-26 DIAGNOSIS — G4733 Obstructive sleep apnea (adult) (pediatric): Secondary | ICD-10-CM | POA: Diagnosis not present

## 2017-11-26 DIAGNOSIS — R0902 Hypoxemia: Secondary | ICD-10-CM | POA: Diagnosis not present

## 2017-12-27 DIAGNOSIS — G4733 Obstructive sleep apnea (adult) (pediatric): Secondary | ICD-10-CM | POA: Diagnosis not present

## 2017-12-27 DIAGNOSIS — I10 Essential (primary) hypertension: Secondary | ICD-10-CM | POA: Diagnosis not present

## 2017-12-27 DIAGNOSIS — R0902 Hypoxemia: Secondary | ICD-10-CM | POA: Diagnosis not present

## 2017-12-30 ENCOUNTER — Telehealth: Payer: Self-pay | Admitting: Adult Health

## 2018-01-04 ENCOUNTER — Encounter: Payer: Self-pay | Admitting: *Deleted

## 2018-01-04 ENCOUNTER — Ambulatory Visit: Payer: Medicare Other | Admitting: Adult Health

## 2018-01-10 ENCOUNTER — Ambulatory Visit: Payer: Medicare Other | Admitting: Adult Health

## 2018-01-27 DIAGNOSIS — R0902 Hypoxemia: Secondary | ICD-10-CM | POA: Diagnosis not present

## 2018-01-27 DIAGNOSIS — I10 Essential (primary) hypertension: Secondary | ICD-10-CM | POA: Diagnosis not present

## 2018-01-27 DIAGNOSIS — G4733 Obstructive sleep apnea (adult) (pediatric): Secondary | ICD-10-CM | POA: Diagnosis not present

## 2018-02-24 DIAGNOSIS — R0902 Hypoxemia: Secondary | ICD-10-CM | POA: Diagnosis not present

## 2018-02-24 DIAGNOSIS — G4733 Obstructive sleep apnea (adult) (pediatric): Secondary | ICD-10-CM | POA: Diagnosis not present

## 2018-02-24 DIAGNOSIS — I10 Essential (primary) hypertension: Secondary | ICD-10-CM | POA: Diagnosis not present

## 2018-03-27 DIAGNOSIS — I10 Essential (primary) hypertension: Secondary | ICD-10-CM | POA: Diagnosis not present

## 2018-03-27 DIAGNOSIS — R0902 Hypoxemia: Secondary | ICD-10-CM | POA: Diagnosis not present

## 2018-03-27 DIAGNOSIS — G4733 Obstructive sleep apnea (adult) (pediatric): Secondary | ICD-10-CM | POA: Diagnosis not present

## 2018-04-26 DIAGNOSIS — G4733 Obstructive sleep apnea (adult) (pediatric): Secondary | ICD-10-CM | POA: Diagnosis not present

## 2018-04-26 DIAGNOSIS — I10 Essential (primary) hypertension: Secondary | ICD-10-CM | POA: Diagnosis not present

## 2018-04-26 DIAGNOSIS — R0902 Hypoxemia: Secondary | ICD-10-CM | POA: Diagnosis not present

## 2018-05-01 DIAGNOSIS — L738 Other specified follicular disorders: Secondary | ICD-10-CM | POA: Diagnosis not present

## 2018-05-01 DIAGNOSIS — L0291 Cutaneous abscess, unspecified: Secondary | ICD-10-CM | POA: Diagnosis not present

## 2018-05-01 DIAGNOSIS — D485 Neoplasm of uncertain behavior of skin: Secondary | ICD-10-CM | POA: Diagnosis not present

## 2018-05-01 DIAGNOSIS — L0390 Cellulitis, unspecified: Secondary | ICD-10-CM | POA: Diagnosis not present

## 2018-05-27 DIAGNOSIS — G4733 Obstructive sleep apnea (adult) (pediatric): Secondary | ICD-10-CM | POA: Diagnosis not present

## 2018-05-27 DIAGNOSIS — I10 Essential (primary) hypertension: Secondary | ICD-10-CM | POA: Diagnosis not present

## 2018-05-27 DIAGNOSIS — R0902 Hypoxemia: Secondary | ICD-10-CM | POA: Diagnosis not present

## 2018-05-31 ENCOUNTER — Ambulatory Visit: Payer: Medicare Other | Admitting: Obstetrics and Gynecology

## 2018-06-12 ENCOUNTER — Ambulatory Visit: Payer: Medicare Other | Admitting: Obstetrics and Gynecology

## 2018-06-15 ENCOUNTER — Ambulatory Visit (INDEPENDENT_AMBULATORY_CARE_PROVIDER_SITE_OTHER): Payer: Medicare Other | Admitting: Obstetrics and Gynecology

## 2018-06-15 ENCOUNTER — Encounter: Payer: Self-pay | Admitting: Obstetrics and Gynecology

## 2018-06-15 ENCOUNTER — Encounter (INDEPENDENT_AMBULATORY_CARE_PROVIDER_SITE_OTHER): Payer: Self-pay

## 2018-06-15 VITALS — BP 94/60 | HR 74 | Ht 61.5 in | Wt 287.0 lb

## 2018-06-15 DIAGNOSIS — Z6841 Body Mass Index (BMI) 40.0 and over, adult: Secondary | ICD-10-CM

## 2018-06-15 DIAGNOSIS — N939 Abnormal uterine and vaginal bleeding, unspecified: Secondary | ICD-10-CM

## 2018-06-15 DIAGNOSIS — R635 Abnormal weight gain: Secondary | ICD-10-CM | POA: Diagnosis not present

## 2018-06-15 NOTE — Progress Notes (Signed)
Patient ID: Tamara [name], female   DOB: 08-May-1976, 42 y.o.   MRN: [id number]    [facility] Visit  @DATE @            Patient name: Tamara [name] MRN [id number]  Date of birth: 20-Feb-1976  CC & HPI:  Tamara [name] is a 42 y.o. female presenting today for vaginal spotting every time she uses the bathroom. She came in for AUB on 02/09/2017. She reports weight gain and drinking a lot of soda. She was prescribed provera and has been taking it. Pt has never had sex. G0P0. Pt reports bumps on bottom that stings when she uses the bathroom. She denies fever, chills, and any other symptoms or complaints at this time.   ROS:  ROS + AUB + weight gain - fever - chills All systems are negative except as noted in the HPI and PMH.   Pertinent History Reviewed:   Reviewed: Significant for AUB Medical         Past Medical History:  Diagnosis Date  . Abnormal uterine bleeding (AUB) 01/26/2016  . Anxiety and depression    mentally slow  . Chronic abdominal pain   . Depression   . Hematuria 01/26/2016  . Hypertension   . Leg pain   . Obesity   . Ovarian cyst 02/10/2016  . PUD (peptic ulcer disease)   . Reflux   . S/P colonoscopy 03/15/11   friable anal canal  . Sebaceous cyst of labia 01/26/2016  . Yeast vaginitis 09/19/2013                              Surgical Hx:    Past Surgical History:  Procedure Laterality Date  . TOOTH EXTRACTION N/A 06/03/2014   Procedure: EXTRACTION WISDOM TEETH;  Surgeon: Gae Bon, DDS;  Location: [place];  Service: Oral Surgery;  Laterality: N/A;  . TUBAL LIGATION     Medications: Reviewed & Updated - see associated section                       Current Outpatient Medications:  .  acetaminophen (TYLENOL) 325 MG tablet, Take 650 mg by mouth 4 (four) times daily as needed., Disp: , Rfl:  .  allopurinol (ZYLOPRIM) 300 MG tablet, Take 300 mg by mouth daily. , Disp: , Rfl:  .  ALPRAZolam (XANAX) 0.5 MG tablet, Take 0.5 mg by mouth 3 (three) times daily as  needed. Anxiety, Disp: , Rfl:  .  amLODipine-benazepril (LOTREL) 10-40 MG capsule, Take 1 capsule by mouth daily., Disp: , Rfl:  .  cetirizine (ZYRTEC) 10 MG tablet, Take 10 mg by mouth daily., Disp: , Rfl:  .  cloNIDine (CATAPRES) 0.2 MG tablet, Take 0.2 mg by mouth 3 (three) times daily. , Disp: , Rfl:  .  COLCRYS 0.6 MG tablet, Take 0.6 mg by mouth 2 (two) times daily. , Disp: , Rfl:  .  DULoxetine (CYMBALTA) 60 MG capsule, Take 60 mg by mouth at bedtime.  , Disp: , Rfl:  .  furosemide (LASIX) 40 MG tablet, Take 40 mg by mouth daily. , Disp: , Rfl:  .  hydrochlorothiazide (MICROZIDE) 12.5 MG capsule, Take 12.5 mg by mouth daily., Disp: , Rfl:  .  HYDROcodone-acetaminophen (NORCO/VICODIN) 5-325 MG per tablet, Take 1 tablet by mouth 4 (four) times daily. , Disp: , Rfl:  .  lactulose (CHRONULAC) 10 GM/15ML solution, Take  20 g by mouth at bedtime., Disp: , Rfl:  .  medroxyPROGESTERone (PROVERA) 10 MG tablet, Take 1 tablet (10 mg total) by mouth daily. One tablet daily x 2 weeks. Repeat as directed, Disp: 14 tablet, Rfl: 3 .  metoprolol (LOPRESSOR) 100 MG tablet, 100 mg 2 (two) times daily. , Disp: , Rfl:  .  mupirocin ointment (BACTROBAN) 2 %, Apply 1 application topically 2 (two) times daily. , Disp: , Rfl:  .  omeprazole (PRILOSEC) 20 MG capsule, Take 20 mg by mouth daily.  , Disp: , Rfl:    Social History: Reviewed -  reports that she has never smoked. She has never used smokeless tobacco.  Objective Findings:  Vitals: Blood pressure 94/60, pulse 74, height 5' 1.5" (1.562 m), weight 287 lb (130.2 kg).  Wt Readings from Last 3 Encounters:  06/15/18 287 lb (130.2 kg)  02/24/17 (!) 314 lb (142.4 kg)  02/09/17 (!) 315 lb (142.9 kg)   PHYSICAL EXAMINATION General appearance - alert, well appearing, and in no distress, oriented to person, place, and time and overweight Mental status - alert, oriented to person, place, and time, affect appropriate to mood  PELVIC External genitalia -  multiple vulvar comedones.  Vagina - brown discharge and bleeding Cervix - normal Adnexa - limited body habitus  Assessment & Plan:   A:  1.  AUB 2. History of chronic anovulation 3. History of dermoid cyst s/p salpingoophorectomy 4.    Morbid obesity getting worse Body mass index is 53.35 kg/m.  P:  1. Pregnancy Test today 2. Improve Diet 3. F/U in 2-4 wks for endometrial biopsy 4. Continue taking Rx Provera x14 days every 3 months   By signing my name below, I, De Burrs, attest that this documentation has been prepared under the direction and in the presence of [name], MD. Electronically Signed: De Burrs, Medical Scribe. 06/15/18. 10:29 AM.  I personally performed the services described in this documentation, which was SCRIBED in my presence. The recorded information has been reviewed and considered accurate. It has been edited as necessary during review. [name], MD

## 2018-06-26 DIAGNOSIS — G4733 Obstructive sleep apnea (adult) (pediatric): Secondary | ICD-10-CM | POA: Diagnosis not present

## 2018-06-26 DIAGNOSIS — I10 Essential (primary) hypertension: Secondary | ICD-10-CM | POA: Diagnosis not present

## 2018-06-26 DIAGNOSIS — R0902 Hypoxemia: Secondary | ICD-10-CM | POA: Diagnosis not present

## 2018-07-14 DIAGNOSIS — K210 Gastro-esophageal reflux disease with esophagitis: Secondary | ICD-10-CM | POA: Diagnosis not present

## 2018-07-14 DIAGNOSIS — I10 Essential (primary) hypertension: Secondary | ICD-10-CM | POA: Diagnosis not present

## 2018-07-19 ENCOUNTER — Other Ambulatory Visit: Payer: Medicare Other | Admitting: Obstetrics and Gynecology

## 2018-07-27 DIAGNOSIS — G4733 Obstructive sleep apnea (adult) (pediatric): Secondary | ICD-10-CM | POA: Diagnosis not present

## 2018-07-27 DIAGNOSIS — R0902 Hypoxemia: Secondary | ICD-10-CM | POA: Diagnosis not present

## 2018-07-27 DIAGNOSIS — I10 Essential (primary) hypertension: Secondary | ICD-10-CM | POA: Diagnosis not present

## 2018-07-28 ENCOUNTER — Encounter: Payer: Self-pay | Admitting: *Deleted

## 2018-07-28 ENCOUNTER — Other Ambulatory Visit: Payer: Medicare Other | Admitting: Obstetrics and Gynecology

## 2018-08-11 ENCOUNTER — Encounter: Payer: Self-pay | Admitting: Obstetrics and Gynecology

## 2018-08-11 ENCOUNTER — Other Ambulatory Visit: Payer: Self-pay | Admitting: Obstetrics and Gynecology

## 2018-08-11 ENCOUNTER — Ambulatory Visit (INDEPENDENT_AMBULATORY_CARE_PROVIDER_SITE_OTHER): Payer: Medicare Other | Admitting: Obstetrics and Gynecology

## 2018-08-11 VITALS — BP 148/91 | HR 90 | Ht 61.5 in | Wt 285.4 lb

## 2018-08-11 DIAGNOSIS — N939 Abnormal uterine and vaginal bleeding, unspecified: Secondary | ICD-10-CM

## 2018-08-11 NOTE — Progress Notes (Signed)
**Note Tamara-Identified via Obfuscation** Patient ID: Tamara [name], female   DOB: 10-12-1976, 42 y.o.   MRN: [id number]  Endometrial Biopsy:  Today, she reports on and off bleeding but does not keep any sort of record of it.  Patient given informed consent, signed copy in the chart, time out was performed. Time out taken. The patient was placed in the lithotomy position and the cervix brought into view with sterile speculum.  Portio of cervix cleansed x 2 with betadine swabs.  A tenaculum was placed in the anterior lip of the cervix. Cervix was difficult to see. Speculum covered with condom, then intracervical Lidocaine was used prior to tenaculum The uterus was sounded for depth of 8 cm,. Milex uterine Explora 3 mm was introduced to into the uterus, suction created,  and an endometrial sample was obtained. All equipment was removed and accounted for.   The patient tolerated the procedure well.   Patient given post procedure instructions.  Followup: Call with results  By signing my name below, I, Tamara [name], attest that this documentation has been prepared under the direction and in the presence of [name], MD. Electronically Signed: De [name], Medical Scribe. 08/11/18. 10:33 AM.  I personally performed the services described in this documentation, which was SCRIBED in my presence. The recorded information has been reviewed and considered accurate. It has been edited as necessary during review. [name], MD

## 2018-08-11 NOTE — Addendum Note (Signed)
Addended by: Christiana Pellant A on: 08/11/2018 11:47 AM   Modules accepted: Orders

## 2018-08-22 ENCOUNTER — Telehealth: Payer: Self-pay | Admitting: *Deleted

## 2018-08-22 NOTE — Telephone Encounter (Signed)
Patient informed biopsy did not show cancer or precancer. Verbalized understanding.

## 2018-08-27 DIAGNOSIS — I10 Essential (primary) hypertension: Secondary | ICD-10-CM | POA: Diagnosis not present

## 2018-08-27 DIAGNOSIS — G4733 Obstructive sleep apnea (adult) (pediatric): Secondary | ICD-10-CM | POA: Diagnosis not present

## 2018-08-27 DIAGNOSIS — R0902 Hypoxemia: Secondary | ICD-10-CM | POA: Diagnosis not present

## 2018-09-04 ENCOUNTER — Other Ambulatory Visit: Payer: Medicare Other | Admitting: Obstetrics and Gynecology

## 2018-09-21 ENCOUNTER — Other Ambulatory Visit: Payer: Medicare Other | Admitting: Obstetrics and Gynecology

## 2018-09-26 DIAGNOSIS — G4733 Obstructive sleep apnea (adult) (pediatric): Secondary | ICD-10-CM | POA: Diagnosis not present

## 2018-09-26 DIAGNOSIS — R0902 Hypoxemia: Secondary | ICD-10-CM | POA: Diagnosis not present

## 2018-09-26 DIAGNOSIS — I10 Essential (primary) hypertension: Secondary | ICD-10-CM | POA: Diagnosis not present

## 2018-10-06 ENCOUNTER — Ambulatory Visit (INDEPENDENT_AMBULATORY_CARE_PROVIDER_SITE_OTHER): Payer: Medicare Other | Admitting: Obstetrics and Gynecology

## 2018-10-06 ENCOUNTER — Encounter: Payer: Self-pay | Admitting: Obstetrics and Gynecology

## 2018-10-06 VITALS — BP 126/69 | HR 88 | Ht 62.75 in | Wt 281.6 lb

## 2018-10-06 DIAGNOSIS — N911 Secondary amenorrhea: Secondary | ICD-10-CM

## 2018-10-06 MED ORDER — MEDROXYPROGESTERONE ACETATE 10 MG PO TABS: 10.0000 mg | ORAL_TABLET | Freq: Every day | ORAL | 3 refills | Status: DC

## 2018-10-06 NOTE — Patient Instructions (Signed)
Please take the provera x 2 weeks , every third month., to bring on a menstrual period.

## 2018-10-06 NOTE — Progress Notes (Signed)
Patient ID: Tamara [name], female   DOB: 1976-03-09, 42 y.o.   MRN: [id number]   Assessment:  Pap NOT done Amenorrhea Plan:  1. pap smear done, next pap due 3 years 2. Rx Provera, tajk once a day for 2 weeks taken every 3 months  3.   Annual mammogram advised after age 60 Subjective:  Tamara [name] is a 42 y.o. female G0P0 who presents for annual exam. No LMP recorded. (Menstrual status: Other). The patient has complaints today of off and on periods, sleeping more often and vomiting  The following portions of the patient's history were reviewed and updated as appropriate: allergies, current medications, past family history, past medical history, past social history, past surgical history and problem list. Past Medical History:  Diagnosis Date  . Abnormal uterine bleeding (AUB) 01/26/2016  . Anxiety and depression    mentally slow  . Chronic abdominal pain   . Depression   . Hematuria 01/26/2016  . Hypertension   . Leg pain   . Obesity   . Ovarian cyst 02/10/2016  . PUD (peptic ulcer disease)   . Reflux   . S/P colonoscopy 03/15/11   friable anal canal  . Sebaceous cyst of labia 01/26/2016  . Yeast vaginitis 09/19/2013    Past Surgical History:  Procedure Laterality Date  . TOOTH EXTRACTION N/A 06/03/2014   Procedure: EXTRACTION WISDOM TEETH;  Surgeon: Gae Bon, DDS;  Location: [place];  Service: Oral Surgery;  Laterality: N/A;  . TUBAL LIGATION       Current Outpatient Medications:  .  acetaminophen (TYLENOL) 325 MG tablet, Take 650 mg by mouth 4 (four) times daily as needed., Disp: , Rfl:  .  allopurinol (ZYLOPRIM) 300 MG tablet, Take 300 mg by mouth daily. , Disp: , Rfl:  .  ALPRAZolam (XANAX) 0.5 MG tablet, Take 0.5 mg by mouth 3 (three) times daily as needed. Anxiety, Disp: , Rfl:  .  amLODipine-benazepril (LOTREL) 10-40 MG capsule, Take 1 capsule by mouth daily., Disp: , Rfl:  .  cetirizine (ZYRTEC) 10 MG tablet, Take 10 mg by mouth daily., Disp: , Rfl:  .  cloNIDine  (CATAPRES) 0.2 MG tablet, Take 0.2 mg by mouth 3 (three) times daily. , Disp: , Rfl:  .  COLCRYS 0.6 MG tablet, Take 0.6 mg by mouth 2 (two) times daily. , Disp: , Rfl:  .  DULoxetine (CYMBALTA) 60 MG capsule, Take 60 mg by mouth at bedtime.  , Disp: , Rfl:  .  furosemide (LASIX) 40 MG tablet, Take 40 mg by mouth daily. , Disp: , Rfl:  .  hydrochlorothiazide (MICROZIDE) 12.5 MG capsule, Take 12.5 mg by mouth daily., Disp: , Rfl:  .  HYDROcodone-acetaminophen (NORCO/VICODIN) 5-325 MG per tablet, Take 1 tablet by mouth 4 (four) times daily. , Disp: , Rfl:  .  lactulose (CHRONULAC) 10 GM/15ML solution, Take 20 g by mouth at bedtime., Disp: , Rfl:  .  medroxyPROGESTERone (PROVERA) 10 MG tablet, Take 1 tablet (10 mg total) by mouth daily. One tablet daily x 2 weeks. Repeat as directed, Disp: 14 tablet, Rfl: 3 .  metoprolol (LOPRESSOR) 100 MG tablet, 100 mg 2 (two) times daily. , Disp: , Rfl:  .  mupirocin ointment (BACTROBAN) 2 %, Apply 1 application topically 2 (two) times daily. , Disp: , Rfl:  .  omeprazole (PRILOSEC) 20 MG capsule, Take 20 mg by mouth daily.  , Disp: , Rfl:   Review of Systems Constitutional: negative Gastrointestinal: negative Genitourinary: normal  Objective:  There were no vitals taken for this visit.   BMI: There is no height or weight on file to calculate BMI.  General Appearance: Alert, appropriate appearance for age. No acute distress HEENT: Grossly normal Neck / Thyroid:  Cardiovascular: RRR; normal S1, S2, no murmur Lungs: CTA bilaterally Back: No CVAT Breast Exam: Normal to inspection and No masses or nodes.No dimpling, nipple retraction or discharge. Gastrointestinal: Soft, non-tender, no masses or organomegaly Pelvic Exam:   EXTERNAL GENITALIA: Multiple sebaceous cyst bilaterally on labia majora VAGINA: normal appearing vagina with normal color and discharge, no lesions,  CERVIX: normal  UTERUS: not examined, unable to feel due to body inhabitus  exam  limited by body in habitus  PAP: Pap smear not performed.  Lymphatic Exam: Non-palpable nodes in neck, clavicular, axillary, or inguinal regions  Skin: no rash or abnormalities Neurologic: Normal gait and speech, no tremor  Psychiatric: Alert and oriented, appropriate affect.  Urinalysis:Not done  By signing my name below, I, Samul Dada, attest that this documentation has been prepared under the direction and in the presence of [name], MD. Electronically Signed: [place]. 10/06/18. 11:23 AM.  I personally performed the services described in this documentation, which was SCRIBED in my presence. The recorded information has been reviewed and considered accurate. It has been edited as necessary during review. [name], MD

## 2018-10-18 DIAGNOSIS — Z23 Encounter for immunization: Secondary | ICD-10-CM | POA: Diagnosis not present

## 2018-10-27 DIAGNOSIS — R0902 Hypoxemia: Secondary | ICD-10-CM | POA: Diagnosis not present

## 2018-10-27 DIAGNOSIS — I10 Essential (primary) hypertension: Secondary | ICD-10-CM | POA: Diagnosis not present

## 2018-10-27 DIAGNOSIS — G4733 Obstructive sleep apnea (adult) (pediatric): Secondary | ICD-10-CM | POA: Diagnosis not present

## 2018-11-26 DIAGNOSIS — R0902 Hypoxemia: Secondary | ICD-10-CM | POA: Diagnosis not present

## 2018-11-26 DIAGNOSIS — G4733 Obstructive sleep apnea (adult) (pediatric): Secondary | ICD-10-CM | POA: Diagnosis not present

## 2018-11-26 DIAGNOSIS — I10 Essential (primary) hypertension: Secondary | ICD-10-CM | POA: Diagnosis not present

## 2018-12-06 ENCOUNTER — Other Ambulatory Visit: Payer: Self-pay | Admitting: Pulmonary Disease

## 2018-12-06 ENCOUNTER — Other Ambulatory Visit (HOSPITAL_COMMUNITY): Payer: Self-pay | Admitting: Pulmonary Disease

## 2018-12-06 DIAGNOSIS — I10 Essential (primary) hypertension: Secondary | ICD-10-CM | POA: Diagnosis not present

## 2018-12-06 DIAGNOSIS — R112 Nausea with vomiting, unspecified: Secondary | ICD-10-CM

## 2018-12-06 DIAGNOSIS — K210 Gastro-esophageal reflux disease with esophagitis: Secondary | ICD-10-CM | POA: Diagnosis not present

## 2018-12-12 ENCOUNTER — Ambulatory Visit (HOSPITAL_COMMUNITY)
Admission: RE | Admit: 2018-12-12 | Discharge: 2018-12-12 | Disposition: A | Discharge status: Home / Self Care | Payer: Medicare Other | Source: Ambulatory Visit | Attending: Pulmonary Disease | Admitting: Pulmonary Disease

## 2018-12-12 DIAGNOSIS — R112 Nausea with vomiting, unspecified: Secondary | ICD-10-CM | POA: Diagnosis not present

## 2018-12-12 DIAGNOSIS — K7689 Other specified diseases of liver: Secondary | ICD-10-CM | POA: Diagnosis not present

## 2018-12-15 ENCOUNTER — Other Ambulatory Visit (HOSPITAL_COMMUNITY): Payer: Self-pay | Admitting: Pulmonary Disease

## 2018-12-15 DIAGNOSIS — R112 Nausea with vomiting, unspecified: Secondary | ICD-10-CM

## 2018-12-21 ENCOUNTER — Encounter (HOSPITAL_COMMUNITY)
Admission: RE | Admit: 2018-12-21 | Discharge: 2018-12-21 | Disposition: A | Discharge status: Home / Self Care | Payer: Medicare Other | Source: Ambulatory Visit | Attending: Pulmonary Disease | Admitting: Pulmonary Disease

## 2018-12-21 ENCOUNTER — Encounter (HOSPITAL_COMMUNITY): Payer: Self-pay

## 2018-12-21 DIAGNOSIS — R112 Nausea with vomiting, unspecified: Secondary | ICD-10-CM | POA: Diagnosis not present

## 2018-12-21 DIAGNOSIS — R109 Unspecified abdominal pain: Secondary | ICD-10-CM | POA: Diagnosis not present

## 2018-12-21 MED ORDER — SINCALIDE 5 MCG IJ SOLR
INTRAMUSCULAR | Status: AC
  Administered 2018-12-21: 2.56 ug via INTRAVENOUS
  Filled 2018-12-21: qty 5

## 2018-12-21 MED ORDER — STERILE WATER FOR INJECTION IJ SOLN
INTRAMUSCULAR | Status: AC
  Administered 2018-12-21: 2.56 mL via INTRAVENOUS
  Filled 2018-12-21: qty 10

## 2018-12-21 MED ORDER — SODIUM CHLORIDE 0.9% FLUSH
INTRAVENOUS | Status: AC
  Filled 2018-12-21: qty 40

## 2018-12-21 MED ORDER — TECHNETIUM TC 99M MEBROFENIN IV KIT
5.0000 | PACK | Freq: Once | INTRAVENOUS | Status: AC | PRN
  Administered 2018-12-21: 5.5 via INTRAVENOUS

## 2018-12-27 DIAGNOSIS — G4733 Obstructive sleep apnea (adult) (pediatric): Secondary | ICD-10-CM | POA: Diagnosis not present

## 2018-12-27 DIAGNOSIS — R0902 Hypoxemia: Secondary | ICD-10-CM | POA: Diagnosis not present

## 2018-12-27 DIAGNOSIS — I10 Essential (primary) hypertension: Secondary | ICD-10-CM | POA: Diagnosis not present

## 2019-01-27 DIAGNOSIS — G4733 Obstructive sleep apnea (adult) (pediatric): Secondary | ICD-10-CM | POA: Diagnosis not present

## 2019-01-27 DIAGNOSIS — I10 Essential (primary) hypertension: Secondary | ICD-10-CM | POA: Diagnosis not present

## 2019-01-27 DIAGNOSIS — R0902 Hypoxemia: Secondary | ICD-10-CM | POA: Diagnosis not present

## 2019-02-25 DIAGNOSIS — R0902 Hypoxemia: Secondary | ICD-10-CM | POA: Diagnosis not present

## 2019-02-25 DIAGNOSIS — G4733 Obstructive sleep apnea (adult) (pediatric): Secondary | ICD-10-CM | POA: Diagnosis not present

## 2019-02-25 DIAGNOSIS — I10 Essential (primary) hypertension: Secondary | ICD-10-CM | POA: Diagnosis not present

## 2019-03-28 DIAGNOSIS — G4733 Obstructive sleep apnea (adult) (pediatric): Secondary | ICD-10-CM | POA: Diagnosis not present

## 2019-03-28 DIAGNOSIS — I10 Essential (primary) hypertension: Secondary | ICD-10-CM | POA: Diagnosis not present

## 2019-03-28 DIAGNOSIS — R0902 Hypoxemia: Secondary | ICD-10-CM | POA: Diagnosis not present

## 2019-04-17 DIAGNOSIS — K210 Gastro-esophageal reflux disease with esophagitis: Secondary | ICD-10-CM | POA: Diagnosis not present

## 2019-04-17 DIAGNOSIS — I10 Essential (primary) hypertension: Secondary | ICD-10-CM | POA: Diagnosis not present

## 2019-04-27 DIAGNOSIS — I10 Essential (primary) hypertension: Secondary | ICD-10-CM | POA: Diagnosis not present

## 2019-04-27 DIAGNOSIS — R0902 Hypoxemia: Secondary | ICD-10-CM | POA: Diagnosis not present

## 2019-04-27 DIAGNOSIS — G4733 Obstructive sleep apnea (adult) (pediatric): Secondary | ICD-10-CM | POA: Diagnosis not present

## 2019-05-28 DIAGNOSIS — R0902 Hypoxemia: Secondary | ICD-10-CM | POA: Diagnosis not present

## 2019-05-28 DIAGNOSIS — I10 Essential (primary) hypertension: Secondary | ICD-10-CM | POA: Diagnosis not present

## 2019-05-28 DIAGNOSIS — G4733 Obstructive sleep apnea (adult) (pediatric): Secondary | ICD-10-CM | POA: Diagnosis not present

## 2019-06-27 DIAGNOSIS — R0902 Hypoxemia: Secondary | ICD-10-CM | POA: Diagnosis not present

## 2019-06-27 DIAGNOSIS — G4733 Obstructive sleep apnea (adult) (pediatric): Secondary | ICD-10-CM | POA: Diagnosis not present

## 2019-06-27 DIAGNOSIS — I10 Essential (primary) hypertension: Secondary | ICD-10-CM | POA: Diagnosis not present

## 2019-07-28 DIAGNOSIS — G4733 Obstructive sleep apnea (adult) (pediatric): Secondary | ICD-10-CM | POA: Diagnosis not present

## 2019-07-28 DIAGNOSIS — R0902 Hypoxemia: Secondary | ICD-10-CM | POA: Diagnosis not present

## 2019-07-28 DIAGNOSIS — I10 Essential (primary) hypertension: Secondary | ICD-10-CM | POA: Diagnosis not present

## 2019-08-28 DIAGNOSIS — I10 Essential (primary) hypertension: Secondary | ICD-10-CM | POA: Diagnosis not present

## 2019-08-28 DIAGNOSIS — G4733 Obstructive sleep apnea (adult) (pediatric): Secondary | ICD-10-CM | POA: Diagnosis not present

## 2019-08-28 DIAGNOSIS — R0902 Hypoxemia: Secondary | ICD-10-CM | POA: Diagnosis not present

## 2019-09-17 DIAGNOSIS — I10 Essential (primary) hypertension: Secondary | ICD-10-CM | POA: Diagnosis not present

## 2019-09-17 DIAGNOSIS — K210 Gastro-esophageal reflux disease with esophagitis: Secondary | ICD-10-CM | POA: Diagnosis not present

## 2019-09-17 DIAGNOSIS — Z23 Encounter for immunization: Secondary | ICD-10-CM | POA: Diagnosis not present

## 2019-09-21 ENCOUNTER — Other Ambulatory Visit: Payer: Self-pay | Admitting: Obstetrics and Gynecology

## 2019-09-27 DIAGNOSIS — I10 Essential (primary) hypertension: Secondary | ICD-10-CM | POA: Diagnosis not present

## 2019-09-27 DIAGNOSIS — R0902 Hypoxemia: Secondary | ICD-10-CM | POA: Diagnosis not present

## 2019-09-27 DIAGNOSIS — G4733 Obstructive sleep apnea (adult) (pediatric): Secondary | ICD-10-CM | POA: Diagnosis not present

## 2019-10-28 DIAGNOSIS — R0902 Hypoxemia: Secondary | ICD-10-CM | POA: Diagnosis not present

## 2019-10-28 DIAGNOSIS — G4733 Obstructive sleep apnea (adult) (pediatric): Secondary | ICD-10-CM | POA: Diagnosis not present

## 2019-10-28 DIAGNOSIS — I10 Essential (primary) hypertension: Secondary | ICD-10-CM | POA: Diagnosis not present

## 2019-11-18 DIAGNOSIS — H5213 Myopia, bilateral: Secondary | ICD-10-CM | POA: Diagnosis not present

## 2019-11-20 ENCOUNTER — Telehealth: Payer: Self-pay | Admitting: Adult Health

## 2019-11-20 NOTE — Telephone Encounter (Signed)
Number has been disconnected.

## 2019-11-21 ENCOUNTER — Other Ambulatory Visit: Payer: Medicare Other | Admitting: Adult Health

## 2019-11-27 DIAGNOSIS — R0902 Hypoxemia: Secondary | ICD-10-CM | POA: Diagnosis not present

## 2019-11-27 DIAGNOSIS — G4733 Obstructive sleep apnea (adult) (pediatric): Secondary | ICD-10-CM | POA: Diagnosis not present

## 2019-11-27 DIAGNOSIS — I10 Essential (primary) hypertension: Secondary | ICD-10-CM | POA: Diagnosis not present

## 2019-12-22 ENCOUNTER — Other Ambulatory Visit: Payer: Self-pay | Admitting: Obstetrics and Gynecology

## 2019-12-25 ENCOUNTER — Telehealth: Payer: Self-pay | Admitting: Family Medicine

## 2019-12-25 ENCOUNTER — Other Ambulatory Visit: Payer: Self-pay

## 2019-12-26 ENCOUNTER — Ambulatory Visit: Payer: Medicare Other | Admitting: Family Medicine

## 2019-12-28 DIAGNOSIS — G4733 Obstructive sleep apnea (adult) (pediatric): Secondary | ICD-10-CM | POA: Diagnosis not present

## 2019-12-28 DIAGNOSIS — I10 Essential (primary) hypertension: Secondary | ICD-10-CM | POA: Diagnosis not present

## 2019-12-28 DIAGNOSIS — R0902 Hypoxemia: Secondary | ICD-10-CM | POA: Diagnosis not present

## 2020-01-10 ENCOUNTER — Telehealth: Payer: Self-pay | Admitting: *Deleted

## 2020-01-10 NOTE — Telephone Encounter (Signed)
Pt asked pharmacy to send [place] a request for [facility] prescription. It was originally prescribed by Dr. Luan Pulling. Will route to provider to see if this is something we can take over. She currently takes 0.25mg  BID.

## 2020-01-23 DIAGNOSIS — J301 Allergic rhinitis due to pollen: Secondary | ICD-10-CM | POA: Diagnosis not present

## 2020-01-23 DIAGNOSIS — K219 Gastro-esophageal reflux disease without esophagitis: Secondary | ICD-10-CM | POA: Diagnosis not present

## 2020-01-23 DIAGNOSIS — M10029 Idiopathic gout, unspecified elbow: Secondary | ICD-10-CM | POA: Diagnosis not present

## 2020-01-23 DIAGNOSIS — I10 Essential (primary) hypertension: Secondary | ICD-10-CM | POA: Diagnosis not present

## 2020-01-28 DIAGNOSIS — R0902 Hypoxemia: Secondary | ICD-10-CM | POA: Diagnosis not present

## 2020-01-28 DIAGNOSIS — I10 Essential (primary) hypertension: Secondary | ICD-10-CM | POA: Diagnosis not present

## 2020-01-28 DIAGNOSIS — G4733 Obstructive sleep apnea (adult) (pediatric): Secondary | ICD-10-CM | POA: Diagnosis not present

## 2020-01-31 DIAGNOSIS — R1084 Generalized abdominal pain: Secondary | ICD-10-CM | POA: Diagnosis not present

## 2020-01-31 DIAGNOSIS — J9601 Acute respiratory failure with hypoxia: Secondary | ICD-10-CM | POA: Diagnosis not present

## 2020-01-31 DIAGNOSIS — J189 Pneumonia, unspecified organism: Secondary | ICD-10-CM | POA: Diagnosis not present

## 2020-01-31 DIAGNOSIS — R918 Other nonspecific abnormal finding of lung field: Secondary | ICD-10-CM | POA: Diagnosis not present

## 2020-01-31 DIAGNOSIS — J1282 Pneumonia due to coronavirus disease 2019: Secondary | ICD-10-CM | POA: Diagnosis not present

## 2020-01-31 DIAGNOSIS — Z4682 Encounter for fitting and adjustment of non-vascular catheter: Secondary | ICD-10-CM | POA: Diagnosis not present

## 2020-01-31 DIAGNOSIS — U071 COVID-19: Secondary | ICD-10-CM | POA: Diagnosis not present

## 2020-01-31 DIAGNOSIS — Z9911 Dependence on respirator [ventilator] status: Secondary | ICD-10-CM | POA: Diagnosis not present

## 2020-01-31 DIAGNOSIS — R0902 Hypoxemia: Secondary | ICD-10-CM | POA: Diagnosis not present

## 2020-01-31 DIAGNOSIS — R6250 Unspecified lack of expected normal physiological development in childhood: Secondary | ICD-10-CM | POA: Diagnosis not present

## 2020-01-31 DIAGNOSIS — N179 Acute kidney failure, unspecified: Secondary | ICD-10-CM | POA: Diagnosis not present

## 2020-01-31 DIAGNOSIS — I959 Hypotension, unspecified: Secondary | ICD-10-CM | POA: Diagnosis not present

## 2020-01-31 DIAGNOSIS — R5381 Other malaise: Secondary | ICD-10-CM | POA: Diagnosis not present

## 2020-01-31 DIAGNOSIS — E876 Hypokalemia: Secondary | ICD-10-CM | POA: Diagnosis not present

## 2020-01-31 DIAGNOSIS — Z79899 Other long term (current) drug therapy: Secondary | ICD-10-CM | POA: Diagnosis not present

## 2020-02-01 DIAGNOSIS — Z4682 Encounter for fitting and adjustment of non-vascular catheter: Secondary | ICD-10-CM | POA: Diagnosis not present

## 2020-02-01 DIAGNOSIS — R6521 Severe sepsis with septic shock: Secondary | ICD-10-CM | POA: Diagnosis not present

## 2020-02-01 DIAGNOSIS — R34 Anuria and oliguria: Secondary | ICD-10-CM | POA: Diagnosis not present

## 2020-02-01 DIAGNOSIS — U071 COVID-19: Secondary | ICD-10-CM | POA: Diagnosis not present

## 2020-02-01 DIAGNOSIS — J80 Acute respiratory distress syndrome: Secondary | ICD-10-CM | POA: Insufficient documentation

## 2020-02-01 DIAGNOSIS — R918 Other nonspecific abnormal finding of lung field: Secondary | ICD-10-CM | POA: Diagnosis not present

## 2020-02-01 DIAGNOSIS — R7989 Other specified abnormal findings of blood chemistry: Secondary | ICD-10-CM | POA: Diagnosis not present

## 2020-02-01 DIAGNOSIS — N179 Acute kidney failure, unspecified: Secondary | ICD-10-CM | POA: Insufficient documentation

## 2020-02-01 DIAGNOSIS — R000 Tachycardia, unspecified: Secondary | ICD-10-CM | POA: Diagnosis not present

## 2020-02-01 DIAGNOSIS — Z452 Encounter for adjustment and management of vascular access device: Secondary | ICD-10-CM | POA: Diagnosis not present

## 2020-02-01 DIAGNOSIS — A419 Sepsis, unspecified organism: Secondary | ICD-10-CM | POA: Diagnosis not present

## 2020-02-01 DIAGNOSIS — Z9911 Dependence on respirator [ventilator] status: Secondary | ICD-10-CM | POA: Diagnosis not present

## 2020-02-01 DIAGNOSIS — J189 Pneumonia, unspecified organism: Secondary | ICD-10-CM | POA: Diagnosis not present

## 2020-02-01 DIAGNOSIS — R6250 Unspecified lack of expected normal physiological development in childhood: Secondary | ICD-10-CM | POA: Insufficient documentation

## 2020-02-02 DIAGNOSIS — R579 Shock, unspecified: Secondary | ICD-10-CM | POA: Diagnosis not present

## 2020-02-02 DIAGNOSIS — J9691 Respiratory failure, unspecified with hypoxia: Secondary | ICD-10-CM | POA: Diagnosis not present

## 2020-02-02 DIAGNOSIS — U071 COVID-19: Secondary | ICD-10-CM | POA: Diagnosis not present

## 2020-02-02 DIAGNOSIS — Z9911 Dependence on respirator [ventilator] status: Secondary | ICD-10-CM | POA: Diagnosis not present

## 2020-02-02 DIAGNOSIS — N179 Acute kidney failure, unspecified: Secondary | ICD-10-CM | POA: Diagnosis not present

## 2020-02-02 DIAGNOSIS — J80 Acute respiratory distress syndrome: Secondary | ICD-10-CM | POA: Diagnosis not present

## 2020-02-03 DIAGNOSIS — I959 Hypotension, unspecified: Secondary | ICD-10-CM | POA: Diagnosis not present

## 2020-02-03 DIAGNOSIS — D6859 Other primary thrombophilia: Secondary | ICD-10-CM | POA: Diagnosis not present

## 2020-02-03 DIAGNOSIS — E872 Acidosis: Secondary | ICD-10-CM | POA: Diagnosis not present

## 2020-02-03 DIAGNOSIS — U071 COVID-19: Secondary | ICD-10-CM | POA: Diagnosis not present

## 2020-02-03 DIAGNOSIS — J80 Acute respiratory distress syndrome: Secondary | ICD-10-CM | POA: Diagnosis not present

## 2020-02-03 DIAGNOSIS — N179 Acute kidney failure, unspecified: Secondary | ICD-10-CM | POA: Diagnosis not present

## 2020-02-03 DIAGNOSIS — R579 Shock, unspecified: Secondary | ICD-10-CM | POA: Diagnosis not present

## 2020-02-04 DIAGNOSIS — R579 Shock, unspecified: Secondary | ICD-10-CM | POA: Diagnosis not present

## 2020-02-04 DIAGNOSIS — Z9911 Dependence on respirator [ventilator] status: Secondary | ICD-10-CM | POA: Diagnosis not present

## 2020-02-04 DIAGNOSIS — U071 COVID-19: Secondary | ICD-10-CM | POA: Diagnosis not present

## 2020-02-04 DIAGNOSIS — I499 Cardiac arrhythmia, unspecified: Secondary | ICD-10-CM | POA: Diagnosis not present

## 2020-02-04 DIAGNOSIS — N179 Acute kidney failure, unspecified: Secondary | ICD-10-CM | POA: Diagnosis not present

## 2020-02-04 DIAGNOSIS — I517 Cardiomegaly: Secondary | ICD-10-CM | POA: Diagnosis not present

## 2020-02-04 DIAGNOSIS — J80 Acute respiratory distress syndrome: Secondary | ICD-10-CM | POA: Diagnosis not present

## 2020-02-05 DIAGNOSIS — Z9911 Dependence on respirator [ventilator] status: Secondary | ICD-10-CM | POA: Diagnosis not present

## 2020-02-05 DIAGNOSIS — E872 Acidosis: Secondary | ICD-10-CM | POA: Diagnosis not present

## 2020-02-05 DIAGNOSIS — J80 Acute respiratory distress syndrome: Secondary | ICD-10-CM | POA: Diagnosis not present

## 2020-02-05 DIAGNOSIS — R109 Unspecified abdominal pain: Secondary | ICD-10-CM | POA: Diagnosis not present

## 2020-02-05 DIAGNOSIS — U071 COVID-19: Secondary | ICD-10-CM | POA: Diagnosis not present

## 2020-02-05 DIAGNOSIS — J9601 Acute respiratory failure with hypoxia: Secondary | ICD-10-CM | POA: Diagnosis not present

## 2020-02-05 DIAGNOSIS — R579 Shock, unspecified: Secondary | ICD-10-CM | POA: Diagnosis not present

## 2020-02-05 DIAGNOSIS — I499 Cardiac arrhythmia, unspecified: Secondary | ICD-10-CM | POA: Diagnosis not present

## 2020-02-05 DIAGNOSIS — N179 Acute kidney failure, unspecified: Secondary | ICD-10-CM | POA: Diagnosis not present

## 2020-02-06 DIAGNOSIS — M7989 Other specified soft tissue disorders: Secondary | ICD-10-CM | POA: Diagnosis not present

## 2020-02-06 DIAGNOSIS — R6521 Severe sepsis with septic shock: Secondary | ICD-10-CM | POA: Diagnosis not present

## 2020-02-06 DIAGNOSIS — N179 Acute kidney failure, unspecified: Secondary | ICD-10-CM | POA: Diagnosis not present

## 2020-02-06 DIAGNOSIS — I472 Ventricular tachycardia: Secondary | ICD-10-CM | POA: Diagnosis not present

## 2020-02-06 DIAGNOSIS — J80 Acute respiratory distress syndrome: Secondary | ICD-10-CM | POA: Diagnosis not present

## 2020-02-06 DIAGNOSIS — U071 COVID-19: Secondary | ICD-10-CM | POA: Diagnosis not present

## 2020-02-06 DIAGNOSIS — A419 Sepsis, unspecified organism: Secondary | ICD-10-CM | POA: Diagnosis not present

## 2020-02-06 DIAGNOSIS — I469 Cardiac arrest, cause unspecified: Secondary | ICD-10-CM | POA: Diagnosis not present

## 2020-02-06 DIAGNOSIS — J9601 Acute respiratory failure with hypoxia: Secondary | ICD-10-CM | POA: Diagnosis not present

## 2020-02-07 DIAGNOSIS — R000 Tachycardia, unspecified: Secondary | ICD-10-CM | POA: Diagnosis not present

## 2020-02-07 DIAGNOSIS — I472 Ventricular tachycardia: Secondary | ICD-10-CM | POA: Diagnosis not present

## 2020-02-07 DIAGNOSIS — R0600 Dyspnea, unspecified: Secondary | ICD-10-CM | POA: Diagnosis not present

## 2020-02-07 DIAGNOSIS — Z4682 Encounter for fitting and adjustment of non-vascular catheter: Secondary | ICD-10-CM | POA: Diagnosis not present

## 2020-02-07 DIAGNOSIS — R579 Shock, unspecified: Secondary | ICD-10-CM | POA: Diagnosis not present

## 2020-02-07 DIAGNOSIS — R918 Other nonspecific abnormal finding of lung field: Secondary | ICD-10-CM | POA: Diagnosis not present

## 2020-02-07 DIAGNOSIS — J80 Acute respiratory distress syndrome: Secondary | ICD-10-CM | POA: Diagnosis not present

## 2020-02-07 DIAGNOSIS — E872 Acidosis: Secondary | ICD-10-CM | POA: Diagnosis not present

## 2020-02-07 DIAGNOSIS — I469 Cardiac arrest, cause unspecified: Secondary | ICD-10-CM | POA: Diagnosis not present

## 2020-02-07 DIAGNOSIS — N179 Acute kidney failure, unspecified: Secondary | ICD-10-CM | POA: Diagnosis not present

## 2020-02-07 DIAGNOSIS — U071 COVID-19: Secondary | ICD-10-CM | POA: Diagnosis not present

## 2020-02-08 DIAGNOSIS — I499 Cardiac arrhythmia, unspecified: Secondary | ICD-10-CM | POA: Diagnosis not present

## 2020-02-08 DIAGNOSIS — R579 Shock, unspecified: Secondary | ICD-10-CM | POA: Diagnosis not present

## 2020-02-08 DIAGNOSIS — N179 Acute kidney failure, unspecified: Secondary | ICD-10-CM | POA: Diagnosis not present

## 2020-02-08 DIAGNOSIS — J80 Acute respiratory distress syndrome: Secondary | ICD-10-CM | POA: Diagnosis not present

## 2020-02-08 DIAGNOSIS — I469 Cardiac arrest, cause unspecified: Secondary | ICD-10-CM | POA: Diagnosis not present

## 2020-02-08 DIAGNOSIS — U071 COVID-19: Secondary | ICD-10-CM | POA: Diagnosis not present

## 2020-02-08 DIAGNOSIS — I959 Hypotension, unspecified: Secondary | ICD-10-CM | POA: Diagnosis not present

## 2020-02-08 DIAGNOSIS — J9601 Acute respiratory failure with hypoxia: Secondary | ICD-10-CM | POA: Diagnosis not present

## 2020-02-08 DIAGNOSIS — I214 Non-ST elevation (NSTEMI) myocardial infarction: Secondary | ICD-10-CM | POA: Diagnosis not present

## 2020-02-08 DIAGNOSIS — Z452 Encounter for adjustment and management of vascular access device: Secondary | ICD-10-CM | POA: Diagnosis not present

## 2020-02-09 DIAGNOSIS — U071 COVID-19: Secondary | ICD-10-CM | POA: Diagnosis not present

## 2020-02-09 DIAGNOSIS — R6521 Severe sepsis with septic shock: Secondary | ICD-10-CM | POA: Diagnosis not present

## 2020-02-09 DIAGNOSIS — A419 Sepsis, unspecified organism: Secondary | ICD-10-CM | POA: Diagnosis not present

## 2020-02-09 DIAGNOSIS — J80 Acute respiratory distress syndrome: Secondary | ICD-10-CM | POA: Diagnosis not present

## 2020-02-09 DIAGNOSIS — I469 Cardiac arrest, cause unspecified: Secondary | ICD-10-CM | POA: Diagnosis not present

## 2020-02-09 DIAGNOSIS — Z9911 Dependence on respirator [ventilator] status: Secondary | ICD-10-CM | POA: Diagnosis not present

## 2020-02-09 DIAGNOSIS — N179 Acute kidney failure, unspecified: Secondary | ICD-10-CM | POA: Diagnosis not present

## 2020-02-10 DIAGNOSIS — I4721 Torsades de pointes: Secondary | ICD-10-CM | POA: Insufficient documentation

## 2020-02-10 DIAGNOSIS — I469 Cardiac arrest, cause unspecified: Secondary | ICD-10-CM | POA: Insufficient documentation

## 2020-02-10 DIAGNOSIS — U071 COVID-19: Secondary | ICD-10-CM | POA: Diagnosis not present

## 2020-02-10 DIAGNOSIS — Z9911 Dependence on respirator [ventilator] status: Secondary | ICD-10-CM | POA: Diagnosis not present

## 2020-02-10 DIAGNOSIS — E872 Acidosis: Secondary | ICD-10-CM | POA: Diagnosis not present

## 2020-02-10 DIAGNOSIS — I472 Ventricular tachycardia: Secondary | ICD-10-CM | POA: Insufficient documentation

## 2020-02-10 DIAGNOSIS — J80 Acute respiratory distress syndrome: Secondary | ICD-10-CM | POA: Diagnosis not present

## 2020-02-10 DIAGNOSIS — I499 Cardiac arrhythmia, unspecified: Secondary | ICD-10-CM | POA: Diagnosis not present

## 2020-02-10 DIAGNOSIS — R1032 Left lower quadrant pain: Secondary | ICD-10-CM | POA: Diagnosis not present

## 2020-02-10 DIAGNOSIS — N179 Acute kidney failure, unspecified: Secondary | ICD-10-CM | POA: Insufficient documentation

## 2020-02-11 DIAGNOSIS — J1282 Pneumonia due to coronavirus disease 2019: Secondary | ICD-10-CM | POA: Diagnosis not present

## 2020-02-11 DIAGNOSIS — N179 Acute kidney failure, unspecified: Secondary | ICD-10-CM | POA: Diagnosis not present

## 2020-02-11 DIAGNOSIS — Z9911 Dependence on respirator [ventilator] status: Secondary | ICD-10-CM | POA: Diagnosis not present

## 2020-02-11 DIAGNOSIS — J80 Acute respiratory distress syndrome: Secondary | ICD-10-CM | POA: Diagnosis not present

## 2020-02-11 DIAGNOSIS — N170 Acute kidney failure with tubular necrosis: Secondary | ICD-10-CM | POA: Diagnosis not present

## 2020-02-11 DIAGNOSIS — I469 Cardiac arrest, cause unspecified: Secondary | ICD-10-CM | POA: Diagnosis not present

## 2020-02-11 DIAGNOSIS — U071 COVID-19: Secondary | ICD-10-CM | POA: Diagnosis not present

## 2020-02-11 DIAGNOSIS — D6859 Other primary thrombophilia: Secondary | ICD-10-CM | POA: Diagnosis not present

## 2020-02-12 DIAGNOSIS — I469 Cardiac arrest, cause unspecified: Secondary | ICD-10-CM | POA: Diagnosis not present

## 2020-02-12 DIAGNOSIS — R918 Other nonspecific abnormal finding of lung field: Secondary | ICD-10-CM | POA: Diagnosis not present

## 2020-02-12 DIAGNOSIS — J80 Acute respiratory distress syndrome: Secondary | ICD-10-CM | POA: Diagnosis not present

## 2020-02-12 DIAGNOSIS — Z4682 Encounter for fitting and adjustment of non-vascular catheter: Secondary | ICD-10-CM | POA: Diagnosis not present

## 2020-02-12 DIAGNOSIS — Z9911 Dependence on respirator [ventilator] status: Secondary | ICD-10-CM | POA: Diagnosis not present

## 2020-02-12 DIAGNOSIS — R0902 Hypoxemia: Secondary | ICD-10-CM | POA: Diagnosis not present

## 2020-02-12 DIAGNOSIS — U071 COVID-19: Secondary | ICD-10-CM | POA: Diagnosis not present

## 2020-02-12 DIAGNOSIS — E872 Acidosis: Secondary | ICD-10-CM | POA: Diagnosis not present

## 2020-02-12 DIAGNOSIS — N179 Acute kidney failure, unspecified: Secondary | ICD-10-CM | POA: Diagnosis not present

## 2020-02-12 DIAGNOSIS — I959 Hypotension, unspecified: Secondary | ICD-10-CM | POA: Diagnosis not present

## 2020-02-13 DIAGNOSIS — I469 Cardiac arrest, cause unspecified: Secondary | ICD-10-CM | POA: Diagnosis not present

## 2020-02-13 DIAGNOSIS — N179 Acute kidney failure, unspecified: Secondary | ICD-10-CM | POA: Diagnosis not present

## 2020-02-13 DIAGNOSIS — Z4682 Encounter for fitting and adjustment of non-vascular catheter: Secondary | ICD-10-CM | POA: Diagnosis not present

## 2020-02-13 DIAGNOSIS — Z452 Encounter for adjustment and management of vascular access device: Secondary | ICD-10-CM | POA: Diagnosis not present

## 2020-02-13 DIAGNOSIS — J80 Acute respiratory distress syndrome: Secondary | ICD-10-CM | POA: Diagnosis not present

## 2020-02-13 DIAGNOSIS — R918 Other nonspecific abnormal finding of lung field: Secondary | ICD-10-CM | POA: Diagnosis not present

## 2020-02-13 DIAGNOSIS — D689 Coagulation defect, unspecified: Secondary | ICD-10-CM | POA: Diagnosis not present

## 2020-02-13 DIAGNOSIS — Z9911 Dependence on respirator [ventilator] status: Secondary | ICD-10-CM | POA: Diagnosis not present

## 2020-02-13 DIAGNOSIS — U071 COVID-19: Secondary | ICD-10-CM | POA: Diagnosis not present

## 2020-02-13 DIAGNOSIS — E872 Acidosis: Secondary | ICD-10-CM | POA: Diagnosis not present

## 2020-02-14 DIAGNOSIS — D689 Coagulation defect, unspecified: Secondary | ICD-10-CM | POA: Diagnosis not present

## 2020-02-14 DIAGNOSIS — I469 Cardiac arrest, cause unspecified: Secondary | ICD-10-CM | POA: Diagnosis not present

## 2020-02-14 DIAGNOSIS — N179 Acute kidney failure, unspecified: Secondary | ICD-10-CM | POA: Diagnosis not present

## 2020-02-14 DIAGNOSIS — J80 Acute respiratory distress syndrome: Secondary | ICD-10-CM | POA: Diagnosis not present

## 2020-02-14 DIAGNOSIS — U071 COVID-19: Secondary | ICD-10-CM | POA: Diagnosis not present

## 2020-02-14 DIAGNOSIS — E872 Acidosis: Secondary | ICD-10-CM | POA: Diagnosis not present

## 2020-02-14 DIAGNOSIS — Z9911 Dependence on respirator [ventilator] status: Secondary | ICD-10-CM | POA: Diagnosis not present

## 2020-02-15 DIAGNOSIS — E872 Acidosis: Secondary | ICD-10-CM | POA: Diagnosis not present

## 2020-02-15 DIAGNOSIS — U071 COVID-19: Secondary | ICD-10-CM | POA: Diagnosis not present

## 2020-02-15 DIAGNOSIS — M7989 Other specified soft tissue disorders: Secondary | ICD-10-CM | POA: Diagnosis not present

## 2020-02-15 DIAGNOSIS — J80 Acute respiratory distress syndrome: Secondary | ICD-10-CM | POA: Diagnosis not present

## 2020-02-15 DIAGNOSIS — G9340 Encephalopathy, unspecified: Secondary | ICD-10-CM | POA: Diagnosis not present

## 2020-02-15 DIAGNOSIS — J9691 Respiratory failure, unspecified with hypoxia: Secondary | ICD-10-CM | POA: Diagnosis not present

## 2020-02-15 DIAGNOSIS — N179 Acute kidney failure, unspecified: Secondary | ICD-10-CM | POA: Diagnosis not present

## 2020-02-15 DIAGNOSIS — Z9911 Dependence on respirator [ventilator] status: Secondary | ICD-10-CM | POA: Diagnosis not present

## 2020-02-16 DIAGNOSIS — J9691 Respiratory failure, unspecified with hypoxia: Secondary | ICD-10-CM | POA: Diagnosis not present

## 2020-02-16 DIAGNOSIS — R918 Other nonspecific abnormal finding of lung field: Secondary | ICD-10-CM | POA: Diagnosis not present

## 2020-02-16 DIAGNOSIS — J189 Pneumonia, unspecified organism: Secondary | ICD-10-CM | POA: Diagnosis not present

## 2020-02-16 DIAGNOSIS — E872 Acidosis: Secondary | ICD-10-CM | POA: Diagnosis not present

## 2020-02-16 DIAGNOSIS — I469 Cardiac arrest, cause unspecified: Secondary | ICD-10-CM | POA: Diagnosis not present

## 2020-02-16 DIAGNOSIS — U071 COVID-19: Secondary | ICD-10-CM | POA: Diagnosis not present

## 2020-02-16 DIAGNOSIS — J80 Acute respiratory distress syndrome: Secondary | ICD-10-CM | POA: Diagnosis not present

## 2020-02-16 DIAGNOSIS — R579 Shock, unspecified: Secondary | ICD-10-CM | POA: Diagnosis not present

## 2020-02-16 DIAGNOSIS — G9340 Encephalopathy, unspecified: Secondary | ICD-10-CM | POA: Diagnosis not present

## 2020-02-16 DIAGNOSIS — N179 Acute kidney failure, unspecified: Secondary | ICD-10-CM | POA: Diagnosis not present

## 2020-02-17 DIAGNOSIS — R05 Cough: Secondary | ICD-10-CM | POA: Diagnosis not present

## 2020-02-17 DIAGNOSIS — Z9911 Dependence on respirator [ventilator] status: Secondary | ICD-10-CM | POA: Diagnosis not present

## 2020-02-17 DIAGNOSIS — R918 Other nonspecific abnormal finding of lung field: Secondary | ICD-10-CM | POA: Diagnosis not present

## 2020-02-17 DIAGNOSIS — I469 Cardiac arrest, cause unspecified: Secondary | ICD-10-CM | POA: Diagnosis not present

## 2020-02-17 DIAGNOSIS — U071 COVID-19: Secondary | ICD-10-CM | POA: Diagnosis not present

## 2020-02-17 DIAGNOSIS — N179 Acute kidney failure, unspecified: Secondary | ICD-10-CM | POA: Diagnosis not present

## 2020-02-17 DIAGNOSIS — Z452 Encounter for adjustment and management of vascular access device: Secondary | ICD-10-CM | POA: Diagnosis not present

## 2020-02-17 DIAGNOSIS — J80 Acute respiratory distress syndrome: Secondary | ICD-10-CM | POA: Diagnosis not present

## 2020-02-17 DIAGNOSIS — Z4682 Encounter for fitting and adjustment of non-vascular catheter: Secondary | ICD-10-CM | POA: Diagnosis not present

## 2020-02-18 DIAGNOSIS — U071 COVID-19: Secondary | ICD-10-CM | POA: Diagnosis not present

## 2020-02-18 DIAGNOSIS — Z9911 Dependence on respirator [ventilator] status: Secondary | ICD-10-CM | POA: Diagnosis not present

## 2020-02-18 DIAGNOSIS — N179 Acute kidney failure, unspecified: Secondary | ICD-10-CM | POA: Diagnosis not present

## 2020-02-18 DIAGNOSIS — E872 Acidosis: Secondary | ICD-10-CM | POA: Diagnosis not present

## 2020-02-18 DIAGNOSIS — I469 Cardiac arrest, cause unspecified: Secondary | ICD-10-CM | POA: Diagnosis not present

## 2020-02-18 DIAGNOSIS — J9601 Acute respiratory failure with hypoxia: Secondary | ICD-10-CM | POA: Diagnosis not present

## 2020-02-18 DIAGNOSIS — J80 Acute respiratory distress syndrome: Secondary | ICD-10-CM | POA: Diagnosis not present

## 2020-02-19 DIAGNOSIS — J80 Acute respiratory distress syndrome: Secondary | ICD-10-CM | POA: Diagnosis not present

## 2020-02-19 DIAGNOSIS — Z452 Encounter for adjustment and management of vascular access device: Secondary | ICD-10-CM | POA: Diagnosis not present

## 2020-02-19 DIAGNOSIS — J9601 Acute respiratory failure with hypoxia: Secondary | ICD-10-CM | POA: Diagnosis not present

## 2020-02-19 DIAGNOSIS — R918 Other nonspecific abnormal finding of lung field: Secondary | ICD-10-CM | POA: Diagnosis not present

## 2020-02-19 DIAGNOSIS — E872 Acidosis: Secondary | ICD-10-CM | POA: Diagnosis not present

## 2020-02-19 DIAGNOSIS — I469 Cardiac arrest, cause unspecified: Secondary | ICD-10-CM | POA: Diagnosis not present

## 2020-02-19 DIAGNOSIS — N179 Acute kidney failure, unspecified: Secondary | ICD-10-CM | POA: Diagnosis not present

## 2020-02-19 DIAGNOSIS — U071 COVID-19: Secondary | ICD-10-CM | POA: Diagnosis not present

## 2020-02-20 DIAGNOSIS — R6521 Severe sepsis with septic shock: Secondary | ICD-10-CM | POA: Diagnosis not present

## 2020-02-20 DIAGNOSIS — A419 Sepsis, unspecified organism: Secondary | ICD-10-CM | POA: Diagnosis not present

## 2020-02-20 DIAGNOSIS — Z452 Encounter for adjustment and management of vascular access device: Secondary | ICD-10-CM | POA: Diagnosis not present

## 2020-02-20 DIAGNOSIS — I469 Cardiac arrest, cause unspecified: Secondary | ICD-10-CM | POA: Diagnosis not present

## 2020-02-20 DIAGNOSIS — J80 Acute respiratory distress syndrome: Secondary | ICD-10-CM | POA: Diagnosis not present

## 2020-02-20 DIAGNOSIS — J9601 Acute respiratory failure with hypoxia: Secondary | ICD-10-CM | POA: Diagnosis not present

## 2020-02-20 DIAGNOSIS — N179 Acute kidney failure, unspecified: Secondary | ICD-10-CM | POA: Diagnosis not present

## 2020-02-20 DIAGNOSIS — D689 Coagulation defect, unspecified: Secondary | ICD-10-CM | POA: Diagnosis not present

## 2020-02-20 DIAGNOSIS — U071 COVID-19: Secondary | ICD-10-CM | POA: Diagnosis not present

## 2020-02-21 DIAGNOSIS — J80 Acute respiratory distress syndrome: Secondary | ICD-10-CM | POA: Diagnosis not present

## 2020-02-21 DIAGNOSIS — N179 Acute kidney failure, unspecified: Secondary | ICD-10-CM | POA: Diagnosis not present

## 2020-02-21 DIAGNOSIS — R6521 Severe sepsis with septic shock: Secondary | ICD-10-CM | POA: Diagnosis not present

## 2020-02-21 DIAGNOSIS — U071 COVID-19: Secondary | ICD-10-CM | POA: Diagnosis not present

## 2020-02-21 DIAGNOSIS — A419 Sepsis, unspecified organism: Secondary | ICD-10-CM | POA: Diagnosis not present

## 2020-02-22 DIAGNOSIS — U071 COVID-19: Secondary | ICD-10-CM | POA: Diagnosis not present

## 2020-02-22 DIAGNOSIS — R0902 Hypoxemia: Secondary | ICD-10-CM | POA: Diagnosis not present

## 2020-02-22 DIAGNOSIS — J9601 Acute respiratory failure with hypoxia: Secondary | ICD-10-CM | POA: Diagnosis not present

## 2020-02-22 DIAGNOSIS — Z9911 Dependence on respirator [ventilator] status: Secondary | ICD-10-CM | POA: Diagnosis not present

## 2020-02-22 DIAGNOSIS — J80 Acute respiratory distress syndrome: Secondary | ICD-10-CM | POA: Diagnosis not present

## 2020-02-22 DIAGNOSIS — R6521 Severe sepsis with septic shock: Secondary | ICD-10-CM | POA: Diagnosis not present

## 2020-02-22 DIAGNOSIS — N179 Acute kidney failure, unspecified: Secondary | ICD-10-CM | POA: Diagnosis not present

## 2020-02-22 DIAGNOSIS — A419 Sepsis, unspecified organism: Secondary | ICD-10-CM | POA: Diagnosis not present

## 2020-02-23 DIAGNOSIS — Z9911 Dependence on respirator [ventilator] status: Secondary | ICD-10-CM | POA: Diagnosis not present

## 2020-02-23 DIAGNOSIS — R6521 Severe sepsis with septic shock: Secondary | ICD-10-CM | POA: Diagnosis not present

## 2020-02-23 DIAGNOSIS — J9601 Acute respiratory failure with hypoxia: Secondary | ICD-10-CM | POA: Diagnosis not present

## 2020-02-23 DIAGNOSIS — A419 Sepsis, unspecified organism: Secondary | ICD-10-CM | POA: Diagnosis not present

## 2020-02-23 DIAGNOSIS — J80 Acute respiratory distress syndrome: Secondary | ICD-10-CM | POA: Diagnosis not present

## 2020-02-23 DIAGNOSIS — N179 Acute kidney failure, unspecified: Secondary | ICD-10-CM | POA: Diagnosis not present

## 2020-02-23 DIAGNOSIS — D689 Coagulation defect, unspecified: Secondary | ICD-10-CM | POA: Diagnosis not present

## 2020-02-23 DIAGNOSIS — I469 Cardiac arrest, cause unspecified: Secondary | ICD-10-CM | POA: Diagnosis not present

## 2020-02-23 DIAGNOSIS — U071 COVID-19: Secondary | ICD-10-CM | POA: Diagnosis not present

## 2020-02-23 DIAGNOSIS — R091 Pleurisy: Secondary | ICD-10-CM | POA: Diagnosis not present

## 2020-02-24 DIAGNOSIS — R6521 Severe sepsis with septic shock: Secondary | ICD-10-CM | POA: Diagnosis not present

## 2020-02-24 DIAGNOSIS — A419 Sepsis, unspecified organism: Secondary | ICD-10-CM | POA: Diagnosis not present

## 2020-02-24 DIAGNOSIS — U071 COVID-19: Secondary | ICD-10-CM | POA: Diagnosis not present

## 2020-02-24 DIAGNOSIS — J80 Acute respiratory distress syndrome: Secondary | ICD-10-CM | POA: Diagnosis not present

## 2020-02-24 DIAGNOSIS — N179 Acute kidney failure, unspecified: Secondary | ICD-10-CM | POA: Diagnosis not present

## 2020-02-24 DIAGNOSIS — I469 Cardiac arrest, cause unspecified: Secondary | ICD-10-CM | POA: Diagnosis not present

## 2020-02-25 DIAGNOSIS — I361 Nonrheumatic tricuspid (valve) insufficiency: Secondary | ICD-10-CM | POA: Diagnosis not present

## 2020-02-25 DIAGNOSIS — J80 Acute respiratory distress syndrome: Secondary | ICD-10-CM | POA: Diagnosis not present

## 2020-02-25 DIAGNOSIS — A419 Sepsis, unspecified organism: Secondary | ICD-10-CM | POA: Diagnosis not present

## 2020-02-25 DIAGNOSIS — R918 Other nonspecific abnormal finding of lung field: Secondary | ICD-10-CM | POA: Diagnosis not present

## 2020-02-25 DIAGNOSIS — R6521 Severe sepsis with septic shock: Secondary | ICD-10-CM | POA: Diagnosis not present

## 2020-02-25 DIAGNOSIS — U071 COVID-19: Secondary | ICD-10-CM | POA: Diagnosis not present

## 2020-02-25 DIAGNOSIS — G4733 Obstructive sleep apnea (adult) (pediatric): Secondary | ICD-10-CM | POA: Diagnosis not present

## 2020-02-25 DIAGNOSIS — I469 Cardiac arrest, cause unspecified: Secondary | ICD-10-CM | POA: Diagnosis not present

## 2020-02-25 DIAGNOSIS — R0902 Hypoxemia: Secondary | ICD-10-CM | POA: Diagnosis not present

## 2020-02-25 DIAGNOSIS — I10 Essential (primary) hypertension: Secondary | ICD-10-CM | POA: Diagnosis not present

## 2020-02-25 DIAGNOSIS — N179 Acute kidney failure, unspecified: Secondary | ICD-10-CM | POA: Diagnosis not present

## 2020-02-26 DIAGNOSIS — K567 Ileus, unspecified: Secondary | ICD-10-CM | POA: Diagnosis not present

## 2020-02-26 DIAGNOSIS — U071 COVID-19: Secondary | ICD-10-CM | POA: Diagnosis not present

## 2020-02-26 DIAGNOSIS — Z4682 Encounter for fitting and adjustment of non-vascular catheter: Secondary | ICD-10-CM | POA: Diagnosis not present

## 2020-02-26 DIAGNOSIS — I21A1 Myocardial infarction type 2: Secondary | ICD-10-CM | POA: Insufficient documentation

## 2020-02-26 DIAGNOSIS — Z452 Encounter for adjustment and management of vascular access device: Secondary | ICD-10-CM | POA: Diagnosis not present

## 2020-02-26 DIAGNOSIS — J80 Acute respiratory distress syndrome: Secondary | ICD-10-CM | POA: Diagnosis not present

## 2020-02-26 DIAGNOSIS — I469 Cardiac arrest, cause unspecified: Secondary | ICD-10-CM | POA: Diagnosis not present

## 2020-02-26 DIAGNOSIS — A411 Sepsis due to other specified staphylococcus: Secondary | ICD-10-CM | POA: Diagnosis not present

## 2020-02-26 DIAGNOSIS — M6282 Rhabdomyolysis: Secondary | ICD-10-CM | POA: Insufficient documentation

## 2020-02-26 DIAGNOSIS — R6521 Severe sepsis with septic shock: Secondary | ICD-10-CM | POA: Diagnosis not present

## 2020-02-26 DIAGNOSIS — N179 Acute kidney failure, unspecified: Secondary | ICD-10-CM | POA: Diagnosis not present

## 2020-02-27 DIAGNOSIS — R6521 Severe sepsis with septic shock: Secondary | ICD-10-CM | POA: Diagnosis not present

## 2020-02-27 DIAGNOSIS — U071 COVID-19: Secondary | ICD-10-CM | POA: Diagnosis not present

## 2020-02-27 DIAGNOSIS — R0602 Shortness of breath: Secondary | ICD-10-CM | POA: Diagnosis not present

## 2020-02-27 DIAGNOSIS — A411 Sepsis due to other specified staphylococcus: Secondary | ICD-10-CM | POA: Diagnosis not present

## 2020-02-27 DIAGNOSIS — I469 Cardiac arrest, cause unspecified: Secondary | ICD-10-CM | POA: Diagnosis not present

## 2020-02-27 DIAGNOSIS — N179 Acute kidney failure, unspecified: Secondary | ICD-10-CM | POA: Diagnosis not present

## 2020-02-27 DIAGNOSIS — J80 Acute respiratory distress syndrome: Secondary | ICD-10-CM | POA: Diagnosis not present

## 2020-02-28 DIAGNOSIS — Z4682 Encounter for fitting and adjustment of non-vascular catheter: Secondary | ICD-10-CM | POA: Diagnosis not present

## 2020-02-28 DIAGNOSIS — R918 Other nonspecific abnormal finding of lung field: Secondary | ICD-10-CM | POA: Diagnosis not present

## 2020-02-28 DIAGNOSIS — R6521 Severe sepsis with septic shock: Secondary | ICD-10-CM | POA: Diagnosis not present

## 2020-02-28 DIAGNOSIS — A411 Sepsis due to other specified staphylococcus: Secondary | ICD-10-CM | POA: Diagnosis not present

## 2020-02-28 DIAGNOSIS — Z9911 Dependence on respirator [ventilator] status: Secondary | ICD-10-CM | POA: Diagnosis not present

## 2020-02-28 DIAGNOSIS — N179 Acute kidney failure, unspecified: Secondary | ICD-10-CM | POA: Diagnosis not present

## 2020-02-28 DIAGNOSIS — U071 COVID-19: Secondary | ICD-10-CM | POA: Diagnosis not present

## 2020-02-28 DIAGNOSIS — Z452 Encounter for adjustment and management of vascular access device: Secondary | ICD-10-CM | POA: Diagnosis not present

## 2020-02-28 DIAGNOSIS — J9690 Respiratory failure, unspecified, unspecified whether with hypoxia or hypercapnia: Secondary | ICD-10-CM | POA: Diagnosis not present

## 2020-02-28 DIAGNOSIS — J80 Acute respiratory distress syndrome: Secondary | ICD-10-CM | POA: Diagnosis not present

## 2020-02-28 DIAGNOSIS — I469 Cardiac arrest, cause unspecified: Secondary | ICD-10-CM | POA: Diagnosis not present

## 2020-02-28 DIAGNOSIS — A4102 Sepsis due to Methicillin resistant Staphylococcus aureus: Secondary | ICD-10-CM | POA: Diagnosis not present

## 2020-02-28 DIAGNOSIS — I472 Ventricular tachycardia: Secondary | ICD-10-CM | POA: Diagnosis not present

## 2020-02-28 DIAGNOSIS — J90 Pleural effusion, not elsewhere classified: Secondary | ICD-10-CM | POA: Diagnosis not present

## 2020-02-29 DIAGNOSIS — J80 Acute respiratory distress syndrome: Secondary | ICD-10-CM | POA: Diagnosis not present

## 2020-02-29 DIAGNOSIS — U071 COVID-19: Secondary | ICD-10-CM | POA: Diagnosis not present

## 2020-02-29 DIAGNOSIS — K828 Other specified diseases of gallbladder: Secondary | ICD-10-CM | POA: Diagnosis not present

## 2020-02-29 DIAGNOSIS — Z452 Encounter for adjustment and management of vascular access device: Secondary | ICD-10-CM | POA: Diagnosis not present

## 2020-02-29 DIAGNOSIS — Z930 Tracheostomy status: Secondary | ICD-10-CM | POA: Diagnosis not present

## 2020-02-29 DIAGNOSIS — J329 Chronic sinusitis, unspecified: Secondary | ICD-10-CM | POA: Diagnosis not present

## 2020-02-29 DIAGNOSIS — Z4682 Encounter for fitting and adjustment of non-vascular catheter: Secondary | ICD-10-CM | POA: Diagnosis not present

## 2020-02-29 DIAGNOSIS — N179 Acute kidney failure, unspecified: Secondary | ICD-10-CM | POA: Diagnosis not present

## 2020-02-29 DIAGNOSIS — I469 Cardiac arrest, cause unspecified: Secondary | ICD-10-CM | POA: Diagnosis not present

## 2020-02-29 DIAGNOSIS — R918 Other nonspecific abnormal finding of lung field: Secondary | ICD-10-CM | POA: Diagnosis not present

## 2020-03-01 DIAGNOSIS — Z930 Tracheostomy status: Secondary | ICD-10-CM | POA: Diagnosis not present

## 2020-03-01 DIAGNOSIS — N179 Acute kidney failure, unspecified: Secondary | ICD-10-CM | POA: Diagnosis not present

## 2020-03-01 DIAGNOSIS — R633 Feeding difficulties: Secondary | ICD-10-CM | POA: Diagnosis not present

## 2020-03-01 DIAGNOSIS — R6521 Severe sepsis with septic shock: Secondary | ICD-10-CM | POA: Diagnosis not present

## 2020-03-01 DIAGNOSIS — J80 Acute respiratory distress syndrome: Secondary | ICD-10-CM | POA: Diagnosis not present

## 2020-03-01 DIAGNOSIS — A419 Sepsis, unspecified organism: Secondary | ICD-10-CM | POA: Diagnosis not present

## 2020-03-01 DIAGNOSIS — U071 COVID-19: Secondary | ICD-10-CM | POA: Diagnosis not present

## 2020-03-01 DIAGNOSIS — J9600 Acute respiratory failure, unspecified whether with hypoxia or hypercapnia: Secondary | ICD-10-CM | POA: Diagnosis not present

## 2020-03-02 DIAGNOSIS — J80 Acute respiratory distress syndrome: Secondary | ICD-10-CM | POA: Diagnosis not present

## 2020-03-02 DIAGNOSIS — A411 Sepsis due to other specified staphylococcus: Secondary | ICD-10-CM | POA: Diagnosis not present

## 2020-03-02 DIAGNOSIS — R6521 Severe sepsis with septic shock: Secondary | ICD-10-CM | POA: Diagnosis not present

## 2020-03-02 DIAGNOSIS — R918 Other nonspecific abnormal finding of lung field: Secondary | ICD-10-CM | POA: Diagnosis not present

## 2020-03-02 DIAGNOSIS — U071 COVID-19: Secondary | ICD-10-CM | POA: Diagnosis not present

## 2020-03-02 DIAGNOSIS — Z4682 Encounter for fitting and adjustment of non-vascular catheter: Secondary | ICD-10-CM | POA: Diagnosis not present

## 2020-03-02 DIAGNOSIS — Z9911 Dependence on respirator [ventilator] status: Secondary | ICD-10-CM | POA: Diagnosis not present

## 2020-03-02 DIAGNOSIS — N179 Acute kidney failure, unspecified: Secondary | ICD-10-CM | POA: Diagnosis not present

## 2020-03-02 DIAGNOSIS — Z930 Tracheostomy status: Secondary | ICD-10-CM | POA: Diagnosis not present

## 2020-03-03 DIAGNOSIS — A419 Sepsis, unspecified organism: Secondary | ICD-10-CM | POA: Diagnosis not present

## 2020-03-03 DIAGNOSIS — Z930 Tracheostomy status: Secondary | ICD-10-CM | POA: Diagnosis not present

## 2020-03-03 DIAGNOSIS — N179 Acute kidney failure, unspecified: Secondary | ICD-10-CM | POA: Diagnosis not present

## 2020-03-03 DIAGNOSIS — I469 Cardiac arrest, cause unspecified: Secondary | ICD-10-CM | POA: Diagnosis not present

## 2020-03-03 DIAGNOSIS — A411 Sepsis due to other specified staphylococcus: Secondary | ICD-10-CM | POA: Diagnosis not present

## 2020-03-03 DIAGNOSIS — U071 COVID-19: Secondary | ICD-10-CM | POA: Diagnosis not present

## 2020-03-03 DIAGNOSIS — R6521 Severe sepsis with septic shock: Secondary | ICD-10-CM | POA: Diagnosis not present

## 2020-03-03 DIAGNOSIS — J80 Acute respiratory distress syndrome: Secondary | ICD-10-CM | POA: Diagnosis not present

## 2020-03-04 DIAGNOSIS — R6521 Severe sepsis with septic shock: Secondary | ICD-10-CM | POA: Diagnosis not present

## 2020-03-04 DIAGNOSIS — R509 Fever, unspecified: Secondary | ICD-10-CM | POA: Diagnosis not present

## 2020-03-04 DIAGNOSIS — R918 Other nonspecific abnormal finding of lung field: Secondary | ICD-10-CM | POA: Diagnosis not present

## 2020-03-04 DIAGNOSIS — A411 Sepsis due to other specified staphylococcus: Secondary | ICD-10-CM | POA: Diagnosis not present

## 2020-03-04 DIAGNOSIS — I469 Cardiac arrest, cause unspecified: Secondary | ICD-10-CM | POA: Diagnosis not present

## 2020-03-04 DIAGNOSIS — J80 Acute respiratory distress syndrome: Secondary | ICD-10-CM | POA: Diagnosis not present

## 2020-03-04 DIAGNOSIS — N179 Acute kidney failure, unspecified: Secondary | ICD-10-CM | POA: Diagnosis not present

## 2020-03-04 DIAGNOSIS — Z930 Tracheostomy status: Secondary | ICD-10-CM | POA: Diagnosis not present

## 2020-03-04 DIAGNOSIS — U071 COVID-19: Secondary | ICD-10-CM | POA: Diagnosis not present

## 2020-03-05 DIAGNOSIS — R6521 Severe sepsis with septic shock: Secondary | ICD-10-CM | POA: Diagnosis not present

## 2020-03-05 DIAGNOSIS — A411 Sepsis due to other specified staphylococcus: Secondary | ICD-10-CM | POA: Diagnosis not present

## 2020-03-05 DIAGNOSIS — Z930 Tracheostomy status: Secondary | ICD-10-CM | POA: Diagnosis not present

## 2020-03-05 DIAGNOSIS — J80 Acute respiratory distress syndrome: Secondary | ICD-10-CM | POA: Diagnosis not present

## 2020-03-05 DIAGNOSIS — U071 COVID-19: Secondary | ICD-10-CM | POA: Diagnosis not present

## 2020-03-05 DIAGNOSIS — N179 Acute kidney failure, unspecified: Secondary | ICD-10-CM | POA: Diagnosis not present

## 2020-03-06 DIAGNOSIS — Z9911 Dependence on respirator [ventilator] status: Secondary | ICD-10-CM | POA: Diagnosis not present

## 2020-03-06 DIAGNOSIS — N179 Acute kidney failure, unspecified: Secondary | ICD-10-CM | POA: Diagnosis not present

## 2020-03-06 DIAGNOSIS — Z930 Tracheostomy status: Secondary | ICD-10-CM | POA: Diagnosis not present

## 2020-03-06 DIAGNOSIS — A411 Sepsis due to other specified staphylococcus: Secondary | ICD-10-CM | POA: Diagnosis not present

## 2020-03-06 DIAGNOSIS — R6521 Severe sepsis with septic shock: Secondary | ICD-10-CM | POA: Diagnosis not present

## 2020-03-06 DIAGNOSIS — J80 Acute respiratory distress syndrome: Secondary | ICD-10-CM | POA: Diagnosis not present

## 2020-03-06 DIAGNOSIS — U071 COVID-19: Secondary | ICD-10-CM | POA: Diagnosis not present

## 2020-03-06 DIAGNOSIS — J9690 Respiratory failure, unspecified, unspecified whether with hypoxia or hypercapnia: Secondary | ICD-10-CM | POA: Diagnosis not present

## 2020-03-06 DIAGNOSIS — I469 Cardiac arrest, cause unspecified: Secondary | ICD-10-CM | POA: Diagnosis not present

## 2020-03-07 DIAGNOSIS — U071 COVID-19: Secondary | ICD-10-CM | POA: Diagnosis not present

## 2020-03-07 DIAGNOSIS — J189 Pneumonia, unspecified organism: Secondary | ICD-10-CM | POA: Diagnosis not present

## 2020-03-07 DIAGNOSIS — M7989 Other specified soft tissue disorders: Secondary | ICD-10-CM | POA: Diagnosis not present

## 2020-03-07 DIAGNOSIS — I469 Cardiac arrest, cause unspecified: Secondary | ICD-10-CM | POA: Diagnosis not present

## 2020-03-07 DIAGNOSIS — J80 Acute respiratory distress syndrome: Secondary | ICD-10-CM | POA: Diagnosis not present

## 2020-03-07 DIAGNOSIS — N179 Acute kidney failure, unspecified: Secondary | ICD-10-CM | POA: Diagnosis not present

## 2020-03-07 DIAGNOSIS — A411 Sepsis due to other specified staphylococcus: Secondary | ICD-10-CM | POA: Diagnosis not present

## 2020-03-07 DIAGNOSIS — R6521 Severe sepsis with septic shock: Secondary | ICD-10-CM | POA: Diagnosis not present

## 2020-03-08 DIAGNOSIS — R6521 Severe sepsis with septic shock: Secondary | ICD-10-CM | POA: Diagnosis not present

## 2020-03-08 DIAGNOSIS — A411 Sepsis due to other specified staphylococcus: Secondary | ICD-10-CM | POA: Diagnosis not present

## 2020-03-08 DIAGNOSIS — J80 Acute respiratory distress syndrome: Secondary | ICD-10-CM | POA: Diagnosis not present

## 2020-03-08 DIAGNOSIS — N179 Acute kidney failure, unspecified: Secondary | ICD-10-CM | POA: Diagnosis not present

## 2020-03-08 DIAGNOSIS — U071 COVID-19: Secondary | ICD-10-CM | POA: Diagnosis not present

## 2020-03-09 DIAGNOSIS — N179 Acute kidney failure, unspecified: Secondary | ICD-10-CM | POA: Diagnosis not present

## 2020-03-09 DIAGNOSIS — R6521 Severe sepsis with septic shock: Secondary | ICD-10-CM | POA: Diagnosis not present

## 2020-03-09 DIAGNOSIS — I469 Cardiac arrest, cause unspecified: Secondary | ICD-10-CM | POA: Diagnosis not present

## 2020-03-09 DIAGNOSIS — U071 COVID-19: Secondary | ICD-10-CM | POA: Diagnosis not present

## 2020-03-09 DIAGNOSIS — A4101 Sepsis due to Methicillin susceptible Staphylococcus aureus: Secondary | ICD-10-CM | POA: Diagnosis not present

## 2020-03-09 DIAGNOSIS — J80 Acute respiratory distress syndrome: Secondary | ICD-10-CM | POA: Diagnosis not present

## 2020-03-10 DIAGNOSIS — I469 Cardiac arrest, cause unspecified: Secondary | ICD-10-CM | POA: Diagnosis not present

## 2020-03-10 DIAGNOSIS — N179 Acute kidney failure, unspecified: Secondary | ICD-10-CM | POA: Diagnosis not present

## 2020-03-10 DIAGNOSIS — R6521 Severe sepsis with septic shock: Secondary | ICD-10-CM | POA: Diagnosis not present

## 2020-03-10 DIAGNOSIS — J80 Acute respiratory distress syndrome: Secondary | ICD-10-CM | POA: Diagnosis not present

## 2020-03-10 DIAGNOSIS — U071 COVID-19: Secondary | ICD-10-CM | POA: Diagnosis not present

## 2020-03-10 DIAGNOSIS — A411 Sepsis due to other specified staphylococcus: Secondary | ICD-10-CM | POA: Diagnosis not present

## 2020-03-11 DIAGNOSIS — R7401 Elevation of levels of liver transaminase levels: Secondary | ICD-10-CM | POA: Diagnosis not present

## 2020-03-11 DIAGNOSIS — R918 Other nonspecific abnormal finding of lung field: Secondary | ICD-10-CM | POA: Diagnosis not present

## 2020-03-11 DIAGNOSIS — R6521 Severe sepsis with septic shock: Secondary | ICD-10-CM | POA: Diagnosis not present

## 2020-03-11 DIAGNOSIS — N179 Acute kidney failure, unspecified: Secondary | ICD-10-CM | POA: Diagnosis not present

## 2020-03-11 DIAGNOSIS — Z452 Encounter for adjustment and management of vascular access device: Secondary | ICD-10-CM | POA: Diagnosis not present

## 2020-03-11 DIAGNOSIS — J80 Acute respiratory distress syndrome: Secondary | ICD-10-CM | POA: Diagnosis not present

## 2020-03-11 DIAGNOSIS — A411 Sepsis due to other specified staphylococcus: Secondary | ICD-10-CM | POA: Diagnosis not present

## 2020-03-11 DIAGNOSIS — U071 COVID-19: Secondary | ICD-10-CM | POA: Diagnosis not present

## 2020-03-11 DIAGNOSIS — I469 Cardiac arrest, cause unspecified: Secondary | ICD-10-CM | POA: Diagnosis not present

## 2020-03-12 DIAGNOSIS — U071 COVID-19: Secondary | ICD-10-CM | POA: Diagnosis not present

## 2020-03-12 DIAGNOSIS — J984 Other disorders of lung: Secondary | ICD-10-CM | POA: Diagnosis not present

## 2020-03-12 DIAGNOSIS — D649 Anemia, unspecified: Secondary | ICD-10-CM | POA: Diagnosis not present

## 2020-03-12 DIAGNOSIS — J80 Acute respiratory distress syndrome: Secondary | ICD-10-CM | POA: Diagnosis not present

## 2020-03-12 DIAGNOSIS — R918 Other nonspecific abnormal finding of lung field: Secondary | ICD-10-CM | POA: Diagnosis not present

## 2020-03-12 DIAGNOSIS — R6521 Severe sepsis with septic shock: Secondary | ICD-10-CM | POA: Diagnosis not present

## 2020-03-12 DIAGNOSIS — I469 Cardiac arrest, cause unspecified: Secondary | ICD-10-CM | POA: Diagnosis not present

## 2020-03-12 DIAGNOSIS — A411 Sepsis due to other specified staphylococcus: Secondary | ICD-10-CM | POA: Diagnosis not present

## 2020-03-12 DIAGNOSIS — N179 Acute kidney failure, unspecified: Secondary | ICD-10-CM | POA: Diagnosis not present

## 2020-03-12 DIAGNOSIS — R519 Headache, unspecified: Secondary | ICD-10-CM | POA: Diagnosis not present

## 2020-03-13 DIAGNOSIS — J80 Acute respiratory distress syndrome: Secondary | ICD-10-CM | POA: Diagnosis not present

## 2020-03-13 DIAGNOSIS — N179 Acute kidney failure, unspecified: Secondary | ICD-10-CM | POA: Diagnosis not present

## 2020-03-13 DIAGNOSIS — U071 COVID-19: Secondary | ICD-10-CM | POA: Diagnosis not present

## 2020-03-13 DIAGNOSIS — I469 Cardiac arrest, cause unspecified: Secondary | ICD-10-CM | POA: Diagnosis not present

## 2020-03-14 DIAGNOSIS — R0902 Hypoxemia: Secondary | ICD-10-CM | POA: Diagnosis not present

## 2020-03-14 DIAGNOSIS — I469 Cardiac arrest, cause unspecified: Secondary | ICD-10-CM | POA: Diagnosis not present

## 2020-03-14 DIAGNOSIS — Z9911 Dependence on respirator [ventilator] status: Secondary | ICD-10-CM | POA: Diagnosis not present

## 2020-03-14 DIAGNOSIS — R6521 Severe sepsis with septic shock: Secondary | ICD-10-CM | POA: Diagnosis not present

## 2020-03-14 DIAGNOSIS — D649 Anemia, unspecified: Secondary | ICD-10-CM | POA: Diagnosis not present

## 2020-03-14 DIAGNOSIS — A411 Sepsis due to other specified staphylococcus: Secondary | ICD-10-CM | POA: Diagnosis not present

## 2020-03-14 DIAGNOSIS — Z4901 Encounter for fitting and adjustment of extracorporeal dialysis catheter: Secondary | ICD-10-CM | POA: Diagnosis not present

## 2020-03-14 DIAGNOSIS — R918 Other nonspecific abnormal finding of lung field: Secondary | ICD-10-CM | POA: Diagnosis not present

## 2020-03-14 DIAGNOSIS — N179 Acute kidney failure, unspecified: Secondary | ICD-10-CM | POA: Diagnosis not present

## 2020-03-14 DIAGNOSIS — J80 Acute respiratory distress syndrome: Secondary | ICD-10-CM | POA: Diagnosis not present

## 2020-03-14 DIAGNOSIS — U071 COVID-19: Secondary | ICD-10-CM | POA: Diagnosis not present

## 2020-03-15 DIAGNOSIS — I469 Cardiac arrest, cause unspecified: Secondary | ICD-10-CM | POA: Diagnosis not present

## 2020-03-15 DIAGNOSIS — N179 Acute kidney failure, unspecified: Secondary | ICD-10-CM | POA: Diagnosis not present

## 2020-03-15 DIAGNOSIS — U071 COVID-19: Secondary | ICD-10-CM | POA: Diagnosis not present

## 2020-03-15 DIAGNOSIS — J80 Acute respiratory distress syndrome: Secondary | ICD-10-CM | POA: Diagnosis not present

## 2020-03-16 DIAGNOSIS — N179 Acute kidney failure, unspecified: Secondary | ICD-10-CM | POA: Diagnosis not present

## 2020-03-16 DIAGNOSIS — J80 Acute respiratory distress syndrome: Secondary | ICD-10-CM | POA: Diagnosis not present

## 2020-03-16 DIAGNOSIS — U071 COVID-19: Secondary | ICD-10-CM | POA: Diagnosis not present

## 2020-03-16 DIAGNOSIS — I469 Cardiac arrest, cause unspecified: Secondary | ICD-10-CM | POA: Diagnosis not present

## 2020-03-17 DIAGNOSIS — R918 Other nonspecific abnormal finding of lung field: Secondary | ICD-10-CM | POA: Diagnosis not present

## 2020-03-17 DIAGNOSIS — R509 Fever, unspecified: Secondary | ICD-10-CM | POA: Diagnosis not present

## 2020-03-18 DIAGNOSIS — U071 COVID-19: Secondary | ICD-10-CM | POA: Diagnosis not present

## 2020-03-18 DIAGNOSIS — I469 Cardiac arrest, cause unspecified: Secondary | ICD-10-CM | POA: Diagnosis not present

## 2020-03-18 DIAGNOSIS — N179 Acute kidney failure, unspecified: Secondary | ICD-10-CM | POA: Diagnosis not present

## 2020-03-18 DIAGNOSIS — J80 Acute respiratory distress syndrome: Secondary | ICD-10-CM | POA: Diagnosis not present

## 2020-03-19 DIAGNOSIS — N179 Acute kidney failure, unspecified: Secondary | ICD-10-CM | POA: Diagnosis not present

## 2020-03-19 DIAGNOSIS — A419 Sepsis, unspecified organism: Secondary | ICD-10-CM | POA: Diagnosis not present

## 2020-03-19 DIAGNOSIS — R05 Cough: Secondary | ICD-10-CM | POA: Diagnosis not present

## 2020-03-19 DIAGNOSIS — R918 Other nonspecific abnormal finding of lung field: Secondary | ICD-10-CM | POA: Diagnosis not present

## 2020-03-19 DIAGNOSIS — I469 Cardiac arrest, cause unspecified: Secondary | ICD-10-CM | POA: Diagnosis not present

## 2020-03-19 DIAGNOSIS — J80 Acute respiratory distress syndrome: Secondary | ICD-10-CM | POA: Diagnosis not present

## 2020-03-19 DIAGNOSIS — U071 COVID-19: Secondary | ICD-10-CM | POA: Diagnosis not present

## 2020-03-20 DIAGNOSIS — J80 Acute respiratory distress syndrome: Secondary | ICD-10-CM | POA: Diagnosis not present

## 2020-03-20 DIAGNOSIS — U071 COVID-19: Secondary | ICD-10-CM | POA: Diagnosis not present

## 2020-03-20 DIAGNOSIS — I469 Cardiac arrest, cause unspecified: Secondary | ICD-10-CM | POA: Diagnosis not present

## 2020-03-20 DIAGNOSIS — N179 Acute kidney failure, unspecified: Secondary | ICD-10-CM | POA: Diagnosis not present

## 2020-03-21 DIAGNOSIS — U071 COVID-19: Secondary | ICD-10-CM | POA: Diagnosis not present

## 2020-03-21 DIAGNOSIS — N179 Acute kidney failure, unspecified: Secondary | ICD-10-CM | POA: Diagnosis not present

## 2020-03-21 DIAGNOSIS — I469 Cardiac arrest, cause unspecified: Secondary | ICD-10-CM | POA: Diagnosis not present

## 2020-03-21 DIAGNOSIS — J80 Acute respiratory distress syndrome: Secondary | ICD-10-CM | POA: Diagnosis not present

## 2020-03-22 DIAGNOSIS — I469 Cardiac arrest, cause unspecified: Secondary | ICD-10-CM | POA: Diagnosis not present

## 2020-03-22 DIAGNOSIS — J80 Acute respiratory distress syndrome: Secondary | ICD-10-CM | POA: Diagnosis not present

## 2020-03-22 DIAGNOSIS — U071 COVID-19: Secondary | ICD-10-CM | POA: Diagnosis not present

## 2020-03-22 DIAGNOSIS — N179 Acute kidney failure, unspecified: Secondary | ICD-10-CM | POA: Diagnosis not present

## 2020-03-23 DIAGNOSIS — J80 Acute respiratory distress syndrome: Secondary | ICD-10-CM | POA: Diagnosis not present

## 2020-03-23 DIAGNOSIS — I469 Cardiac arrest, cause unspecified: Secondary | ICD-10-CM | POA: Diagnosis not present

## 2020-03-23 DIAGNOSIS — N179 Acute kidney failure, unspecified: Secondary | ICD-10-CM | POA: Diagnosis not present

## 2020-03-23 DIAGNOSIS — U071 COVID-19: Secondary | ICD-10-CM | POA: Diagnosis not present

## 2020-03-24 DIAGNOSIS — J80 Acute respiratory distress syndrome: Secondary | ICD-10-CM | POA: Diagnosis not present

## 2020-03-24 DIAGNOSIS — I469 Cardiac arrest, cause unspecified: Secondary | ICD-10-CM | POA: Diagnosis not present

## 2020-03-24 DIAGNOSIS — N179 Acute kidney failure, unspecified: Secondary | ICD-10-CM | POA: Diagnosis not present

## 2020-03-24 DIAGNOSIS — U071 COVID-19: Secondary | ICD-10-CM | POA: Diagnosis not present

## 2020-03-25 DIAGNOSIS — Z8616 Personal history of COVID-19: Secondary | ICD-10-CM | POA: Diagnosis not present

## 2020-03-25 DIAGNOSIS — N179 Acute kidney failure, unspecified: Secondary | ICD-10-CM | POA: Diagnosis not present

## 2020-03-25 DIAGNOSIS — U071 COVID-19: Secondary | ICD-10-CM | POA: Diagnosis not present

## 2020-03-25 DIAGNOSIS — J80 Acute respiratory distress syndrome: Secondary | ICD-10-CM | POA: Diagnosis not present

## 2020-03-25 DIAGNOSIS — Z992 Dependence on renal dialysis: Secondary | ICD-10-CM | POA: Diagnosis not present

## 2020-03-25 DIAGNOSIS — I469 Cardiac arrest, cause unspecified: Secondary | ICD-10-CM | POA: Diagnosis not present

## 2020-03-26 DIAGNOSIS — N179 Acute kidney failure, unspecified: Secondary | ICD-10-CM | POA: Diagnosis not present

## 2020-03-26 DIAGNOSIS — Z992 Dependence on renal dialysis: Secondary | ICD-10-CM | POA: Diagnosis not present

## 2020-03-26 DIAGNOSIS — I469 Cardiac arrest, cause unspecified: Secondary | ICD-10-CM | POA: Diagnosis not present

## 2020-03-26 DIAGNOSIS — R197 Diarrhea, unspecified: Secondary | ICD-10-CM | POA: Diagnosis not present

## 2020-03-26 DIAGNOSIS — U071 COVID-19: Secondary | ICD-10-CM | POA: Diagnosis not present

## 2020-03-27 DIAGNOSIS — R197 Diarrhea, unspecified: Secondary | ICD-10-CM | POA: Diagnosis not present

## 2020-03-27 DIAGNOSIS — Z8616 Personal history of COVID-19: Secondary | ICD-10-CM | POA: Diagnosis not present

## 2020-03-27 DIAGNOSIS — S0990XA Unspecified injury of head, initial encounter: Secondary | ICD-10-CM | POA: Diagnosis not present

## 2020-03-27 DIAGNOSIS — Z992 Dependence on renal dialysis: Secondary | ICD-10-CM | POA: Diagnosis not present

## 2020-03-27 DIAGNOSIS — I469 Cardiac arrest, cause unspecified: Secondary | ICD-10-CM | POA: Diagnosis not present

## 2020-03-27 DIAGNOSIS — N179 Acute kidney failure, unspecified: Secondary | ICD-10-CM | POA: Diagnosis not present

## 2020-03-28 DIAGNOSIS — U071 COVID-19: Secondary | ICD-10-CM | POA: Diagnosis not present

## 2020-03-28 DIAGNOSIS — N179 Acute kidney failure, unspecified: Secondary | ICD-10-CM | POA: Diagnosis not present

## 2020-03-28 DIAGNOSIS — I82612 Acute embolism and thrombosis of superficial veins of left upper extremity: Secondary | ICD-10-CM | POA: Diagnosis not present

## 2020-03-28 DIAGNOSIS — K117 Disturbances of salivary secretion: Secondary | ICD-10-CM | POA: Diagnosis not present

## 2020-03-28 DIAGNOSIS — Z7409 Other reduced mobility: Secondary | ICD-10-CM | POA: Diagnosis not present

## 2020-03-28 DIAGNOSIS — Z992 Dependence on renal dialysis: Secondary | ICD-10-CM | POA: Diagnosis not present

## 2020-03-28 DIAGNOSIS — I469 Cardiac arrest, cause unspecified: Secondary | ICD-10-CM | POA: Diagnosis not present

## 2020-03-28 DIAGNOSIS — D72829 Elevated white blood cell count, unspecified: Secondary | ICD-10-CM | POA: Diagnosis not present

## 2020-03-29 DIAGNOSIS — R05 Cough: Secondary | ICD-10-CM | POA: Diagnosis not present

## 2020-03-29 DIAGNOSIS — D72829 Elevated white blood cell count, unspecified: Secondary | ICD-10-CM | POA: Diagnosis not present

## 2020-03-29 DIAGNOSIS — R509 Fever, unspecified: Secondary | ICD-10-CM | POA: Diagnosis not present

## 2020-03-29 DIAGNOSIS — R918 Other nonspecific abnormal finding of lung field: Secondary | ICD-10-CM | POA: Diagnosis not present

## 2020-03-29 DIAGNOSIS — R0682 Tachypnea, not elsewhere classified: Secondary | ICD-10-CM | POA: Diagnosis not present

## 2020-03-29 DIAGNOSIS — R000 Tachycardia, unspecified: Secondary | ICD-10-CM | POA: Diagnosis not present

## 2020-03-29 DIAGNOSIS — I469 Cardiac arrest, cause unspecified: Secondary | ICD-10-CM | POA: Diagnosis not present

## 2020-03-29 DIAGNOSIS — Z992 Dependence on renal dialysis: Secondary | ICD-10-CM | POA: Diagnosis not present

## 2020-03-29 DIAGNOSIS — U071 COVID-19: Secondary | ICD-10-CM | POA: Diagnosis not present

## 2020-03-29 DIAGNOSIS — N179 Acute kidney failure, unspecified: Secondary | ICD-10-CM | POA: Diagnosis not present

## 2020-03-29 DIAGNOSIS — J80 Acute respiratory distress syndrome: Secondary | ICD-10-CM | POA: Diagnosis not present

## 2020-03-30 DIAGNOSIS — Z992 Dependence on renal dialysis: Secondary | ICD-10-CM | POA: Diagnosis not present

## 2020-03-30 DIAGNOSIS — N179 Acute kidney failure, unspecified: Secondary | ICD-10-CM | POA: Diagnosis not present

## 2020-03-30 DIAGNOSIS — R509 Fever, unspecified: Secondary | ICD-10-CM | POA: Diagnosis not present

## 2020-03-30 DIAGNOSIS — R000 Tachycardia, unspecified: Secondary | ICD-10-CM | POA: Diagnosis not present

## 2020-03-30 DIAGNOSIS — R7401 Elevation of levels of liver transaminase levels: Secondary | ICD-10-CM | POA: Diagnosis not present

## 2020-03-30 DIAGNOSIS — D72829 Elevated white blood cell count, unspecified: Secondary | ICD-10-CM | POA: Diagnosis not present

## 2020-03-31 DIAGNOSIS — R7401 Elevation of levels of liver transaminase levels: Secondary | ICD-10-CM | POA: Diagnosis not present

## 2020-03-31 DIAGNOSIS — Z992 Dependence on renal dialysis: Secondary | ICD-10-CM | POA: Diagnosis not present

## 2020-03-31 DIAGNOSIS — N179 Acute kidney failure, unspecified: Secondary | ICD-10-CM | POA: Diagnosis not present

## 2020-03-31 DIAGNOSIS — D72829 Elevated white blood cell count, unspecified: Secondary | ICD-10-CM | POA: Diagnosis not present

## 2020-03-31 DIAGNOSIS — Z8616 Personal history of COVID-19: Secondary | ICD-10-CM | POA: Diagnosis not present

## 2020-03-31 DIAGNOSIS — U071 COVID-19: Secondary | ICD-10-CM | POA: Diagnosis not present

## 2020-04-02 DIAGNOSIS — N179 Acute kidney failure, unspecified: Secondary | ICD-10-CM | POA: Diagnosis not present

## 2020-04-02 DIAGNOSIS — D649 Anemia, unspecified: Secondary | ICD-10-CM | POA: Diagnosis not present

## 2020-04-02 DIAGNOSIS — R918 Other nonspecific abnormal finding of lung field: Secondary | ICD-10-CM | POA: Diagnosis not present

## 2020-04-04 DIAGNOSIS — N179 Acute kidney failure, unspecified: Secondary | ICD-10-CM | POA: Diagnosis not present

## 2020-04-04 DIAGNOSIS — D649 Anemia, unspecified: Secondary | ICD-10-CM | POA: Diagnosis not present

## 2020-04-08 DIAGNOSIS — R633 Feeding difficulties: Secondary | ICD-10-CM | POA: Diagnosis not present

## 2020-04-08 DIAGNOSIS — R1313 Dysphagia, pharyngeal phase: Secondary | ICD-10-CM | POA: Diagnosis not present

## 2020-04-30 MED ORDER — SEVELAMER CARBONATE 800 MG PO TABS
800.00 | ORAL_TABLET | ORAL | Status: DC
Start: 2020-04-30 — End: 2020-04-30

## 2020-04-30 MED ORDER — APIXABAN 2.5 MG PO TABS
2.50 | ORAL_TABLET | ORAL | Status: DC
Start: 2020-04-30 — End: 2020-04-30

## 2020-04-30 MED ORDER — GENERIC EXTERNAL MEDICATION
1.90 | Status: DC
Start: ? — End: 2020-04-30

## 2020-04-30 MED ORDER — ONDANSETRON 4 MG PO TBDP
4.00 | ORAL_TABLET | ORAL | Status: DC
Start: ? — End: 2020-04-30

## 2020-04-30 MED ORDER — ACETAMINOPHEN 325 MG PO TABS
650.00 | ORAL_TABLET | ORAL | Status: DC
Start: ? — End: 2020-04-30

## 2020-04-30 MED ORDER — GENERIC EXTERNAL MEDICATION
1.80 | Status: DC
Start: ? — End: 2020-04-30

## 2020-04-30 MED ORDER — POLYETHYLENE GLYCOL 3350 17 GM/SCOOP PO POWD
17.00 | ORAL | Status: DC
Start: ? — End: 2020-04-30

## 2020-04-30 MED ORDER — HEPARIN SODIUM (PORCINE) 1000 UNIT/ML IJ SOLN
2000.00 | INTRAMUSCULAR | Status: DC
Start: ? — End: 2020-04-30

## 2020-04-30 MED ORDER — MIDODRINE HCL 5 MG PO TABS
5.00 | ORAL_TABLET | ORAL | Status: DC
Start: ? — End: 2020-04-30

## 2020-04-30 MED ORDER — SODIUM BICARBONATE 650 MG PO TABS
650.00 | ORAL_TABLET | ORAL | Status: DC
Start: 2020-04-30 — End: 2020-04-30

## 2020-04-30 MED ORDER — INFLUENZA VAC SPLIT QUAD 0.5 ML IM SUSY
0.50 | PREFILLED_SYRINGE | INTRAMUSCULAR | Status: DC
Start: ? — End: 2020-04-30

## 2020-04-30 MED ORDER — MELATONIN 3 MG PO TABS
6.00 | ORAL_TABLET | ORAL | Status: DC
Start: 2020-04-30 — End: 2020-04-30

## 2020-04-30 MED ORDER — MAGNESIUM OXIDE 400 MG PO TABS
400.00 | ORAL_TABLET | ORAL | Status: DC
Start: 2020-04-30 — End: 2020-04-30

## 2020-04-30 MED ORDER — DICLOFENAC SODIUM 1 % EX GEL
2.00 | CUTANEOUS | Status: DC
Start: 2020-04-30 — End: 2020-04-30

## 2020-05-14 DIAGNOSIS — M6281 Muscle weakness (generalized): Secondary | ICD-10-CM | POA: Insufficient documentation

## 2020-05-29 ENCOUNTER — Emergency Department (HOSPITAL_COMMUNITY)
Admission: EM | Admit: 2020-05-29 | Discharge: 2020-05-29 | Disposition: A | Discharge status: Home / Self Care | Payer: Medicare Other | Attending: Emergency Medicine | Admitting: Emergency Medicine

## 2020-05-29 DIAGNOSIS — I10 Essential (primary) hypertension: Secondary | ICD-10-CM | POA: Diagnosis not present

## 2020-05-29 DIAGNOSIS — E860 Dehydration: Secondary | ICD-10-CM

## 2020-05-29 DIAGNOSIS — Z79899 Other long term (current) drug therapy: Secondary | ICD-10-CM | POA: Insufficient documentation

## 2020-05-29 DIAGNOSIS — R55 Syncope and collapse: Secondary | ICD-10-CM

## 2020-05-29 LAB — CBC WITH DIFFERENTIAL/PLATELET
Abs Immature Granulocytes: 0.04 10*3/uL (ref 0.00–0.07)
Basophils Absolute: 0 10*3/uL (ref 0.0–0.1)
Basophils Relative: 0 %
Eosinophils Absolute: 0.1 10*3/uL (ref 0.0–0.5)
Eosinophils Relative: 1 %
HCT: 33.2 % — ABNORMAL LOW (ref 36.0–46.0)
Hemoglobin: 10.4 g/dL — ABNORMAL LOW (ref 12.0–15.0)
Immature Granulocytes: 0 %
Lymphocytes Relative: 24 %
Lymphs Abs: 2.4 10*3/uL (ref 0.7–4.0)
MCH: 29.8 pg (ref 26.0–34.0)
MCHC: 31.3 g/dL (ref 30.0–36.0)
MCV: 95.1 fL (ref 80.0–100.0)
Monocytes Absolute: 0.7 10*3/uL (ref 0.1–1.0)
Monocytes Relative: 7 %
Neutro Abs: 7 10*3/uL (ref 1.7–7.7)
Neutrophils Relative %: 68 %
Platelets: 384 10*3/uL (ref 150–400)
RBC: 3.49 MIL/uL — ABNORMAL LOW (ref 3.87–5.11)
RDW: 16.3 % — ABNORMAL HIGH (ref 11.5–15.5)
WBC: 10.1 10*3/uL (ref 4.0–10.5)
nRBC: 0 % (ref 0.0–0.2)

## 2020-05-29 LAB — BASIC METABOLIC PANEL
Anion gap: 11 (ref 5–15)
BUN: 9 mg/dL (ref 6–20)
CO2: 28 mmol/L (ref 22–32)
Calcium: 8.3 mg/dL — ABNORMAL LOW (ref 8.9–10.3)
Chloride: 100 mmol/L (ref 98–111)
Creatinine, Ser: 3.29 mg/dL — ABNORMAL HIGH (ref 0.44–1.00)
GFR calc Af Amer: 19 mL/min — ABNORMAL LOW (ref >60)
GFR calc non Af Amer: 16 mL/min — ABNORMAL LOW (ref >60)
Glucose, Bld: 83 mg/dL (ref 70–99)
Potassium: 2.8 mmol/L — ABNORMAL LOW (ref 3.5–5.1)
Sodium: 139 mmol/L (ref 135–145)

## 2020-05-29 MED ORDER — SODIUM CHLORIDE 0.9 % IV BOLUS
250.0000 mL | Freq: Once | INTRAVENOUS | Status: AC
  Administered 2020-05-29: 250 mL via INTRAVENOUS

## 2020-05-29 MED ORDER — POTASSIUM CHLORIDE CRYS ER 20 MEQ PO TBCR
40.0000 meq | EXTENDED_RELEASE_TABLET | Freq: Once | ORAL | Status: AC
  Administered 2020-05-29: 40 meq via ORAL
  Filled 2020-05-29: qty 2
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
## 2020-05-29 NOTE — Discharge Instructions (Addendum)
Follow-up with your doctor if any problems 

## 2020-05-29 NOTE — ED Provider Notes (Signed)
[facility] EMERGENCY DEPARTMENT Provider Note   CSN: [id number] Arrival date & time: 05/29/20  1754     History Chief Complaint  Patient presents with  . Loss of Consciousness    Tamara [name] is a 44 y.o. female.  Patient had dialysis today and then when she was picked up she passed out twice.  Patient feels fine now.  The history is provided by the patient and medical records.  Loss of Consciousness Episode history:  Multiple Most recent episode:  Today Timing:  Intermittent Progression:  Waxing and waning Chronicity:  New Context: not blood draw   Witnessed: no   Relieved by:  Nothing Worsened by:  Nothing Ineffective treatments:  None tried Associated symptoms: no chest pain, no headaches and no seizures        Past Medical History:  Diagnosis Date  . Abnormal uterine bleeding (AUB) 01/26/2016  . Anxiety and depression    mentally slow  . Chronic abdominal pain   . Depression   . Hematuria 01/26/2016  . Hypertension   . Leg pain   . Obesity   . Ovarian cyst 02/10/2016  . PUD (peptic ulcer disease)   . Reflux   . S/P colonoscopy 03/15/11   friable anal canal  . Sebaceous cyst of labia 01/26/2016  . Yeast vaginitis 09/19/2013    Patient Active Problem List   Diagnosis Date Noted  . Ovarian cyst 02/10/2016  . Abnormal uterine bleeding (AUB) 01/26/2016  . Hematuria 01/26/2016  . Sebaceous cyst of labia 01/26/2016  . Yeast vaginitis 09/19/2013  . Obesity 07/16/2011  . RECTAL BLEEDING 02/23/2011  . Abdominal pain 10/05/2010  . HYPERTENSION NEC 09/28/2010    Past Surgical History:  Procedure Laterality Date  . TOOTH EXTRACTION N/A 06/03/2014   Procedure: EXTRACTION WISDOM TEETH;  Surgeon: Gae Bon, DDS;  Location: [place];  Service: Oral Surgery;  Laterality: N/A;  . TUBAL LIGATION       OB History    Gravida      Para      Term      Preterm      AB      Living  0     SAB      TAB      Ectopic      Multiple      Live Births             Family History  Problem Relation Age of Onset  . Colon polyps Father   . Hypertension Father   . Diabetes Father   . Seizures Mother   . Hypertension Mother   . Diabetes Mother   . Diabetes Paternal Grandmother   . Colon cancer Neg Hx     Social History   Tobacco Use  . Smoking status: Never Smoker  . Smokeless tobacco: Never Used  Substance Use Topics  . Alcohol use: No  . Drug use: No    Home Medications Prior to Admission medications   Medication Sig Start Date End Date Taking? Authorizing Provider  acetaminophen (TYLENOL) 325 MG tablet Take 650 mg by mouth 4 (four) times daily as needed.    [provider]  allopurinol (ZYLOPRIM) 300 MG tablet Take 300 mg by mouth daily.  09/05/13   [provider]  ALPRAZolam Duanne Moron) 0.5 MG tablet Take 0.5 mg by mouth 3 (three) times daily as needed. Anxiety    [provider]  amLODipine-benazepril (LOTREL) 10-40 MG capsule Take 1 capsule by mouth  daily.    [provider]  cetirizine (ZYRTEC) 10 MG tablet Take 10 mg by mouth daily.    [provider]  cloNIDine (CATAPRES) 0.2 MG tablet Take 0.2 mg by mouth 3 (three) times daily.     [provider]  COLCRYS 0.6 MG tablet Take 0.6 mg by mouth 2 (two) times daily.  09/15/13   [provider]  DULoxetine (CYMBALTA) 60 MG capsule Take 60 mg by mouth at bedtime.      [provider]  furosemide (LASIX) 40 MG tablet Take 40 mg by mouth daily.  09/15/13   [provider]  hydrochlorothiazide (MICROZIDE) 12.5 MG capsule Take 12.5 mg by mouth daily.    [provider]  HYDROcodone-acetaminophen (NORCO/VICODIN) 5-325 MG per tablet Take 1 tablet by mouth 4 (four) times daily.  12/13/14   [provider]  lactulose (CHRONULAC) 10 GM/15ML solution Take 20 g by mouth at bedtime.    [provider]  medroxyPROGESTERone (PROVERA) 10 MG tablet TAKE 1 TABLET ONCE DAILY FOR 2 WEEKS. REPEAT  EVERY 3 MONTHS 12/23/19   [name], MD  metoprolol (LOPRESSOR) 100 MG tablet 100 mg 2 (two) times daily.  11/29/14   [provider]  mupirocin ointment (BACTROBAN) 2 % Apply 1 application topically 2 (two) times daily.  06/10/16   [provider]  omeprazole (PRILOSEC) 20 MG capsule Take 20 mg by mouth daily.      [provider]    Allergies    Patient has no known allergies.  Review of Systems   Review of Systems  Constitutional: Negative for appetite change and fatigue.  HENT: Negative for congestion, ear discharge and sinus pressure.   Eyes: Negative for discharge.  Respiratory: Negative for cough.   Cardiovascular: Positive for syncope. Negative for chest pain.  Gastrointestinal: Negative for abdominal pain and diarrhea.  Genitourinary: Negative for frequency and hematuria.  Musculoskeletal: Negative for back pain.  Skin: Negative for rash.  Neurological: Positive for light-headedness. Negative for seizures and headaches.  Psychiatric/Behavioral: Negative for hallucinations.    Physical Exam Updated Vital Signs BP 126/63   Pulse 95   Resp 16   Ht 5\' 5"  (1.651 m)   Wt 104.8 kg   SpO2 100%   BMI 38.44 kg/m   Physical Exam Vitals and nursing note reviewed.  Constitutional:      Appearance: She is well-developed.  HENT:     Head: Normocephalic.     Nose: Nose normal.  Eyes:     General: No scleral icterus.    Conjunctiva/sclera: Conjunctivae normal.  Neck:     Thyroid: No thyromegaly.  Cardiovascular:     Rate and Rhythm: Normal rate and regular rhythm.     Heart sounds: No murmur. No friction rub. No gallop.   Pulmonary:     Breath sounds: No stridor. No wheezing or rales.  Chest:     Chest wall: No tenderness.  Abdominal:     General: There is no distension.     Tenderness: There is no abdominal tenderness. There is no rebound.  Musculoskeletal:        General: Normal range of motion.     Cervical back: Neck supple.    Lymphadenopathy:     Cervical: No cervical adenopathy.  Skin:    Findings: No erythema or rash.  Neurological:     Mental Status: She is alert and oriented to person, place, and time.     Motor: No abnormal  muscle tone.     Coordination: Coordination normal.  Psychiatric:        Behavior: Behavior normal.     ED Results / Procedures / Treatments   Labs (all labs ordered are listed, but only abnormal results are displayed) Labs Reviewed  CBC WITH DIFFERENTIAL/PLATELET - Abnormal; Notable for the following components:      Result Value   RBC 3.49 (*)    Hemoglobin 10.4 (*)    HCT 33.2 (*)    RDW 16.3 (*)    All other components within normal limits  BASIC METABOLIC PANEL - Abnormal; Notable for the following components:   Potassium 2.8 (*)    Creatinine, Ser 3.29 (*)    Calcium 8.3 (*)    GFR calc non Af Amer 16 (*)    GFR calc Af Amer 19 (*)    All other components within normal limits    EKG None  Radiology No results found.  Procedures Procedures (including critical care time)  Medications Ordered in ED Medications  sodium chloride 0.9 % bolus 250 mL (0 mLs Intravenous Stopped 05/29/20 2007)    ED Course  I have reviewed the triage vital signs and the nursing notes.  Pertinent labs & imaging results that were available during my care of the patient were reviewed by me and considered in my medical decision making (see chart for details).    MDM Rules/Calculators/A&P                      Patient with syncope after dialysis.  Potassium mildly low and she is given some potassium.  Patient will follow up with PCP.  Patient also was given 250 cc bolus     This patient presents to the ED for concern of syncope this involves an extensive number of treatment options, and is a complaint that carries with it a high risk of complications and morbidity.  The differential diagnosis includes dehydration anemia   Lab Tests:   I Ordered, reviewed, and interpreted  labs, which included CBC and chemistries that showed anemia and renal failure  Medicines ordered:   I ordered medication potassium for hypokalemia and small bolus of normal saline  Imaging Studies ordered:   Additional history obtained:   Additional history obtained from records  Previous records obtained and reviewed  Consultations Obtained:     Reevaluation:  After the interventions stated above, I reevaluated the patient and found improved  Critical Interventions:  .   Final Clinical Impression(s) / ED Diagnoses Final diagnoses:  Dehydration  Syncope and collapse    Rx / DC Orders ED Discharge Orders    None       Milton Ferguson, MD 05/29/20 2109

## 2020-07-16 ENCOUNTER — Encounter: Payer: Medicare Other | Admitting: Adult Health

## 2020-07-16 ENCOUNTER — Encounter: Payer: Self-pay | Admitting: Adult Health

## 2020-07-17 NOTE — Progress Notes (Signed)
This encounter was created in error - please disregard.

## 2020-08-22 ENCOUNTER — Other Ambulatory Visit: Payer: Self-pay | Admitting: *Deleted

## 2020-08-22 DIAGNOSIS — N186 End stage renal disease: Secondary | ICD-10-CM

## 2020-08-27 DIAGNOSIS — Z0000 Encounter for general adult medical examination without abnormal findings: Secondary | ICD-10-CM | POA: Diagnosis not present

## 2020-08-27 DIAGNOSIS — N186 End stage renal disease: Secondary | ICD-10-CM | POA: Diagnosis not present

## 2020-08-27 DIAGNOSIS — Z23 Encounter for immunization: Secondary | ICD-10-CM | POA: Diagnosis not present

## 2020-08-27 DIAGNOSIS — K580 Irritable bowel syndrome with diarrhea: Secondary | ICD-10-CM | POA: Diagnosis not present

## 2020-08-27 DIAGNOSIS — R296 Repeated falls: Secondary | ICD-10-CM | POA: Diagnosis not present

## 2020-08-27 DIAGNOSIS — I959 Hypotension, unspecified: Secondary | ICD-10-CM | POA: Diagnosis not present

## 2020-08-27 DIAGNOSIS — Z992 Dependence on renal dialysis: Secondary | ICD-10-CM | POA: Diagnosis not present

## 2020-09-01 DIAGNOSIS — Z743 Need for continuous supervision: Secondary | ICD-10-CM | POA: Diagnosis not present

## 2020-09-01 DIAGNOSIS — R42 Dizziness and giddiness: Secondary | ICD-10-CM | POA: Diagnosis not present

## 2020-09-01 DIAGNOSIS — R1013 Epigastric pain: Secondary | ICD-10-CM | POA: Diagnosis not present

## 2020-09-01 DIAGNOSIS — R1110 Vomiting, unspecified: Secondary | ICD-10-CM | POA: Diagnosis not present

## 2020-09-01 DIAGNOSIS — R1012 Left upper quadrant pain: Secondary | ICD-10-CM | POA: Diagnosis not present

## 2020-09-01 DIAGNOSIS — I120 Hypertensive chronic kidney disease with stage 5 chronic kidney disease or end stage renal disease: Secondary | ICD-10-CM | POA: Diagnosis not present

## 2020-09-01 DIAGNOSIS — R5381 Other malaise: Secondary | ICD-10-CM | POA: Diagnosis not present

## 2020-09-01 DIAGNOSIS — N186 End stage renal disease: Secondary | ICD-10-CM | POA: Diagnosis not present

## 2020-09-01 DIAGNOSIS — K859 Acute pancreatitis without necrosis or infection, unspecified: Secondary | ICD-10-CM | POA: Diagnosis not present

## 2020-09-01 DIAGNOSIS — K858 Other acute pancreatitis without necrosis or infection: Secondary | ICD-10-CM | POA: Diagnosis not present

## 2020-09-01 DIAGNOSIS — R110 Nausea: Secondary | ICD-10-CM | POA: Diagnosis not present

## 2020-09-01 DIAGNOSIS — R531 Weakness: Secondary | ICD-10-CM | POA: Diagnosis not present

## 2020-09-02 DIAGNOSIS — N2581 Secondary hyperparathyroidism of renal origin: Secondary | ICD-10-CM | POA: Diagnosis not present

## 2020-09-02 DIAGNOSIS — Z23 Encounter for immunization: Secondary | ICD-10-CM | POA: Diagnosis not present

## 2020-09-02 DIAGNOSIS — N186 End stage renal disease: Secondary | ICD-10-CM | POA: Diagnosis not present

## 2020-09-02 DIAGNOSIS — Z992 Dependence on renal dialysis: Secondary | ICD-10-CM | POA: Diagnosis not present

## 2020-09-03 DIAGNOSIS — R531 Weakness: Secondary | ICD-10-CM | POA: Diagnosis not present

## 2020-09-03 DIAGNOSIS — K861 Other chronic pancreatitis: Secondary | ICD-10-CM | POA: Diagnosis not present

## 2020-09-05 ENCOUNTER — Other Ambulatory Visit: Payer: Self-pay

## 2020-09-05 ENCOUNTER — Inpatient Hospital Stay (HOSPITAL_COMMUNITY)
Admission: EM | Admit: 2020-09-05 | Discharge: 2020-09-10 | DRG: 438 | Disposition: A | Discharge status: Home Health | Payer: Medicare Other | Attending: Internal Medicine | Admitting: Internal Medicine

## 2020-09-05 ENCOUNTER — Encounter (HOSPITAL_COMMUNITY): Payer: Self-pay

## 2020-09-05 ENCOUNTER — Emergency Department (HOSPITAL_COMMUNITY): Payer: Medicare Other

## 2020-09-05 ENCOUNTER — Emergency Department (INDEPENDENT_AMBULATORY_CARE_PROVIDER_SITE_OTHER): Admit: 2020-09-05 | Discharge: 2020-09-05 | Disposition: A | Discharge status: Home / Self Care | Payer: Medicare Other

## 2020-09-05 ENCOUNTER — Telehealth (HOSPITAL_COMMUNITY): Payer: Self-pay

## 2020-09-05 ENCOUNTER — Ambulatory Visit (HOSPITAL_COMMUNITY)
Admission: RE | Admit: 2020-09-05 | Discharge: 2020-09-05 | Disposition: A | Discharge status: Home / Self Care | Payer: Medicare Other | Source: Ambulatory Visit | Attending: Vascular Surgery | Admitting: Vascular Surgery

## 2020-09-05 ENCOUNTER — Encounter: Payer: Medicare Other | Admitting: Vascular Surgery

## 2020-09-05 DIAGNOSIS — Z992 Dependence on renal dialysis: Secondary | ICD-10-CM | POA: Diagnosis not present

## 2020-09-05 DIAGNOSIS — R6889 Other general symptoms and signs: Secondary | ICD-10-CM | POA: Diagnosis not present

## 2020-09-05 DIAGNOSIS — Z79890 Hormone replacement therapy (postmenopausal): Secondary | ICD-10-CM

## 2020-09-05 DIAGNOSIS — E861 Hypovolemia: Secondary | ICD-10-CM | POA: Diagnosis present

## 2020-09-05 DIAGNOSIS — Z79899 Other long term (current) drug therapy: Secondary | ICD-10-CM

## 2020-09-05 DIAGNOSIS — Z833 Family history of diabetes mellitus: Secondary | ICD-10-CM

## 2020-09-05 DIAGNOSIS — J9811 Atelectasis: Secondary | ICD-10-CM | POA: Diagnosis not present

## 2020-09-05 DIAGNOSIS — R6250 Unspecified lack of expected normal physiological development in childhood: Secondary | ICD-10-CM | POA: Diagnosis present

## 2020-09-05 DIAGNOSIS — R7989 Other specified abnormal findings of blood chemistry: Secondary | ICD-10-CM | POA: Diagnosis not present

## 2020-09-05 DIAGNOSIS — Z743 Need for continuous supervision: Secondary | ICD-10-CM | POA: Diagnosis not present

## 2020-09-05 DIAGNOSIS — Z8249 Family history of ischemic heart disease and other diseases of the circulatory system: Secondary | ICD-10-CM

## 2020-09-05 DIAGNOSIS — I951 Orthostatic hypotension: Secondary | ICD-10-CM | POA: Diagnosis not present

## 2020-09-05 DIAGNOSIS — D631 Anemia in chronic kidney disease: Secondary | ICD-10-CM | POA: Diagnosis present

## 2020-09-05 DIAGNOSIS — K858 Other acute pancreatitis without necrosis or infection: Secondary | ICD-10-CM | POA: Diagnosis not present

## 2020-09-05 DIAGNOSIS — I120 Hypertensive chronic kidney disease with stage 5 chronic kidney disease or end stage renal disease: Secondary | ICD-10-CM | POA: Diagnosis present

## 2020-09-05 DIAGNOSIS — K219 Gastro-esophageal reflux disease without esophagitis: Secondary | ICD-10-CM | POA: Diagnosis not present

## 2020-09-05 DIAGNOSIS — Z8711 Personal history of peptic ulcer disease: Secondary | ICD-10-CM

## 2020-09-05 DIAGNOSIS — R8271 Bacteriuria: Secondary | ICD-10-CM | POA: Diagnosis present

## 2020-09-05 DIAGNOSIS — Z8371 Family history of colonic polyps: Secondary | ICD-10-CM

## 2020-09-05 DIAGNOSIS — N186 End stage renal disease: Secondary | ICD-10-CM

## 2020-09-05 DIAGNOSIS — E669 Obesity, unspecified: Secondary | ICD-10-CM | POA: Diagnosis present

## 2020-09-05 DIAGNOSIS — E876 Hypokalemia: Secondary | ICD-10-CM

## 2020-09-05 DIAGNOSIS — K861 Other chronic pancreatitis: Secondary | ICD-10-CM | POA: Diagnosis not present

## 2020-09-05 DIAGNOSIS — Z8616 Personal history of COVID-19: Secondary | ICD-10-CM

## 2020-09-05 DIAGNOSIS — I444 Left anterior fascicular block: Secondary | ICD-10-CM | POA: Diagnosis present

## 2020-09-05 DIAGNOSIS — R42 Dizziness and giddiness: Secondary | ICD-10-CM | POA: Diagnosis not present

## 2020-09-05 DIAGNOSIS — F418 Other specified anxiety disorders: Secondary | ICD-10-CM | POA: Diagnosis present

## 2020-09-05 DIAGNOSIS — Z9115 Patient's noncompliance with renal dialysis: Secondary | ICD-10-CM

## 2020-09-05 DIAGNOSIS — K859 Acute pancreatitis without necrosis or infection, unspecified: Secondary | ICD-10-CM | POA: Diagnosis not present

## 2020-09-05 DIAGNOSIS — R000 Tachycardia, unspecified: Secondary | ICD-10-CM | POA: Diagnosis not present

## 2020-09-05 DIAGNOSIS — R55 Syncope and collapse: Secondary | ICD-10-CM | POA: Diagnosis present

## 2020-09-05 DIAGNOSIS — K8689 Other specified diseases of pancreas: Secondary | ICD-10-CM | POA: Diagnosis not present

## 2020-09-05 DIAGNOSIS — Z6838 Body mass index (BMI) 38.0-38.9, adult: Secondary | ICD-10-CM

## 2020-09-05 DIAGNOSIS — R531 Weakness: Secondary | ICD-10-CM | POA: Diagnosis not present

## 2020-09-05 DIAGNOSIS — F329 Major depressive disorder, single episode, unspecified: Secondary | ICD-10-CM | POA: Diagnosis present

## 2020-09-05 DIAGNOSIS — Z20822 Contact with and (suspected) exposure to covid-19: Secondary | ICD-10-CM | POA: Diagnosis present

## 2020-09-05 DIAGNOSIS — Z01810 Encounter for preprocedural cardiovascular examination: Secondary | ICD-10-CM

## 2020-09-05 DIAGNOSIS — F419 Anxiety disorder, unspecified: Secondary | ICD-10-CM | POA: Diagnosis present

## 2020-09-05 DIAGNOSIS — R404 Transient alteration of awareness: Secondary | ICD-10-CM | POA: Diagnosis not present

## 2020-09-05 LAB — URINALYSIS, ROUTINE W REFLEX MICROSCOPIC
Bilirubin Urine: NEGATIVE
Glucose, UA: NEGATIVE mg/dL
Hgb urine dipstick: NEGATIVE
Ketones, ur: NEGATIVE mg/dL
Nitrite: NEGATIVE
Protein, ur: 30 mg/dL — AB
Specific Gravity, Urine: 1.016 (ref 1.005–1.030)
pH: 5 (ref 5.0–8.0)

## 2020-09-05 LAB — BASIC METABOLIC PANEL
Anion gap: 15 (ref 5–15)
BUN: 22 mg/dL — ABNORMAL HIGH (ref 6–20)
CO2: 26 mmol/L (ref 22–32)
Calcium: 9.1 mg/dL (ref 8.9–10.3)
Chloride: 99 mmol/L (ref 98–111)
Creatinine, Ser: 6.97 mg/dL — ABNORMAL HIGH (ref 0.44–1.00)
GFR calc Af Amer: 8 mL/min — ABNORMAL LOW (ref >60)
GFR calc non Af Amer: 7 mL/min — ABNORMAL LOW (ref >60)
Glucose, Bld: 92 mg/dL (ref 70–99)
Potassium: 3.1 mmol/L — ABNORMAL LOW (ref 3.5–5.1)
Sodium: 140 mmol/L (ref 135–145)

## 2020-09-05 LAB — HEPATIC FUNCTION PANEL
ALT: 11 U/L (ref 0–44)
AST: 12 U/L — ABNORMAL LOW (ref 15–41)
Albumin: 3.1 g/dL — ABNORMAL LOW (ref 3.5–5.0)
Alkaline Phosphatase: 75 U/L (ref 38–126)
Bilirubin, Direct: 0.2 mg/dL (ref 0.0–0.2)
Indirect Bilirubin: 0.9 mg/dL (ref 0.3–0.9)
Total Bilirubin: 1.1 mg/dL (ref 0.3–1.2)
Total Protein: 5.6 g/dL — ABNORMAL LOW (ref 6.5–8.1)

## 2020-09-05 LAB — CBC
HCT: 33.3 % — ABNORMAL LOW (ref 36.0–46.0)
Hemoglobin: 10.5 g/dL — ABNORMAL LOW (ref 12.0–15.0)
MCH: 28.7 pg (ref 26.0–34.0)
MCHC: 31.5 g/dL (ref 30.0–36.0)
MCV: 91 fL (ref 80.0–100.0)
Platelets: 486 10*3/uL — ABNORMAL HIGH (ref 150–400)
RBC: 3.66 MIL/uL — ABNORMAL LOW (ref 3.87–5.11)
RDW: 18.6 % — ABNORMAL HIGH (ref 11.5–15.5)
WBC: 13 10*3/uL — ABNORMAL HIGH (ref 4.0–10.5)
nRBC: 0.2 % (ref 0.0–0.2)

## 2020-09-05 LAB — CBG MONITORING, ED: Glucose-Capillary: 95 mg/dL (ref 70–99)

## 2020-09-05 LAB — I-STAT BETA HCG BLOOD, ED (MC, WL, AP ONLY): I-stat hCG, quantitative: <5 m[IU]/mL (ref <5)

## 2020-09-05 LAB — LIPASE, BLOOD: Lipase: 53 U/L — ABNORMAL HIGH (ref 11–51)

## 2020-09-05 LAB — SARS CORONAVIRUS 2 BY RT PCR (HOSPITAL ORDER, PERFORMED IN ~~LOC~~ HOSPITAL LAB): SARS Coronavirus 2: NEGATIVE

## 2020-09-05 MED ORDER — ACETAMINOPHEN 650 MG RE SUPP: 650.0000 mg | Freq: Four times a day (QID) | RECTAL | Status: DC | PRN

## 2020-09-05 MED ORDER — IOHEXOL 9 MG/ML PO SOLN
ORAL | Status: AC
  Administered 2020-09-05: 500 mL
  Filled 2020-09-05: qty 500

## 2020-09-05 MED ORDER — SODIUM CHLORIDE 0.9 % IV SOLN
1.0000 g | Freq: Once | INTRAVENOUS | Status: AC
  Administered 2020-09-05: 1 g via INTRAVENOUS
  Filled 2020-09-05: qty 10

## 2020-09-05 MED ORDER — SODIUM CHLORIDE 0.9 % IV SOLN: INTRAVENOUS | Status: AC

## 2020-09-05 MED ORDER — SODIUM CHLORIDE 0.9 % IV BOLUS
250.0000 mL | Freq: Once | INTRAVENOUS | Status: AC
  Administered 2020-09-05: 250 mL via INTRAVENOUS

## 2020-09-05 MED ORDER — SODIUM CHLORIDE 0.9 % IV BOLUS (SEPSIS)
500.0000 mL | Freq: Once | INTRAVENOUS | Status: AC
  Administered 2020-09-05: 500 mL via INTRAVENOUS

## 2020-09-05 MED ORDER — PANTOPRAZOLE SODIUM 40 MG PO TBEC
40.0000 mg | DELAYED_RELEASE_TABLET | Freq: Every day | ORAL | Status: DC
  Administered 2020-09-06 – 2020-09-10 (×5): 40 mg via ORAL
  Filled 2020-09-05 (×5): qty 1

## 2020-09-05 MED ORDER — ONDANSETRON HCL 4 MG PO TABS: 4.0000 mg | ORAL_TABLET | Freq: Four times a day (QID) | ORAL | Status: DC | PRN

## 2020-09-05 MED ORDER — HEPARIN SODIUM (PORCINE) 5000 UNIT/ML IJ SOLN
5000.0000 [IU] | Freq: Three times a day (TID) | INTRAMUSCULAR | Status: DC
  Administered 2020-09-06 – 2020-09-10 (×13): 5000 [IU] via SUBCUTANEOUS
  Filled 2020-09-05 (×12): qty 1

## 2020-09-05 MED ORDER — SODIUM CHLORIDE 0.9% FLUSH
3.0000 mL | Freq: Two times a day (BID) | INTRAVENOUS | Status: DC
  Administered 2020-09-06 – 2020-09-10 (×8): 3 mL via INTRAVENOUS

## 2020-09-05 MED ORDER — MIRTAZAPINE 15 MG PO TABS
15.0000 mg | ORAL_TABLET | Freq: Every day | ORAL | Status: DC
  Administered 2020-09-06 – 2020-09-09 (×5): 15 mg via ORAL
  Filled 2020-09-05 (×5): qty 1

## 2020-09-05 MED ORDER — ACETAMINOPHEN 325 MG PO TABS: 650.0000 mg | ORAL_TABLET | Freq: Four times a day (QID) | ORAL | Status: DC | PRN

## 2020-09-05 MED ORDER — POTASSIUM CHLORIDE CRYS ER 20 MEQ PO TBCR
20.0000 meq | EXTENDED_RELEASE_TABLET | Freq: Once | ORAL | Status: AC
  Administered 2020-09-06: 20 meq via ORAL
  Filled 2020-09-05: qty 1

## 2020-09-05 MED ORDER — FENTANYL CITRATE (PF) 100 MCG/2ML IJ SOLN: 12.5000 ug | INTRAMUSCULAR | Status: DC | PRN

## 2020-09-05 MED ORDER — ALPRAZOLAM 0.5 MG PO TABS
0.5000 mg | ORAL_TABLET | Freq: Three times a day (TID) | ORAL | Status: DC | PRN
  Administered 2020-09-08 – 2020-09-09 (×3): 0.5 mg via ORAL
  Filled 2020-09-05 (×3): qty 1

## 2020-09-05 MED ORDER — DULOXETINE HCL 60 MG PO CPEP
60.0000 mg | ORAL_CAPSULE | Freq: Every day | ORAL | Status: DC
  Administered 2020-09-06 – 2020-09-09 (×5): 60 mg via ORAL
  Filled 2020-09-05 (×5): qty 1

## 2020-09-05 MED ORDER — ALLOPURINOL 300 MG PO TABS
300.0000 mg | ORAL_TABLET | Freq: Every day | ORAL | Status: DC
  Administered 2020-09-06 – 2020-09-10 (×5): 300 mg via ORAL
  Filled 2020-09-05 (×5): qty 1

## 2020-09-05 MED ORDER — ONDANSETRON HCL 4 MG/2ML IJ SOLN
4.0000 mg | Freq: Four times a day (QID) | INTRAMUSCULAR | Status: DC | PRN
  Administered 2020-09-06 – 2020-09-08 (×4): 4 mg via INTRAVENOUS
  Filled 2020-09-05 (×4): qty 2

## 2020-09-05 NOTE — ED Notes (Signed)
Culture sent with UA 

## 2020-09-05 NOTE — ED Notes (Signed)
Patient transported to CT 

## 2020-09-05 NOTE — ED Notes (Signed)
PO challenge started

## 2020-09-05 NOTE — ED Triage Notes (Addendum)
Pt arrives to ED via [facility] w/ c/o near syncope secondary to dizziness when standing. Pt has hx of the same. Pt has developmental delay but is at her neurological baseline. EMS stroke screen negative. Normotensive w/ EMS. EMS EKG unremarkable, CBG 111. Pt tues, thurs, sat dialysis pt.

## 2020-09-05 NOTE — ED Provider Notes (Signed)
[facility] EMERGENCY DEPARTMENT Provider Note   CSN: [id number] Arrival date & time: 09/05/20  1203     History Chief Complaint  Patient presents with  . Near Syncope    Tamara [name] is a 44 y.o. female with PMHx developmental delay, chronic abdominal pain, HTN, ESRD on dialysis T Th S who presents to the ED today with complaint of generalized weakness/near syncope. Pt screened by Provider In Triage - it appears she was sent from Dr. Claretha Cooper office for eval after near syncopal episode. Pt did not have any complaints on arrival however does appear she has missed several dialysis appointments due to not feeling well; she was recently in the [facility] ED s/2 pancreatitis on 09/06. Labs and fluids were ordered for patient in triage.   When brought back to the room pt states she feels much better - she again cannot tell me when she last had dialysis. She states she is still having nausea and vomiting at home despite being prescribed zofran. She does report she has hx of passing out; she was at Dr. Claretha Cooper office today "to look at something on my arm" when she reports she fully passed out.   Additional information provided by nursing staff at Dr. Claretha Cooper office. Pt was there today to assess for new access for dialysis graft in upper extremities. It appears that pt's caregiver Bonnita Nasuti was there with her today. Pt was sitting in a chair when she had a syncopal episode; she was placed down to the ground by her caregiver. Nursing staff reports that pt was hard to arouse. They checked her sugar at 109; HR auscultated in the 90's and BP 95/64. 20 minutes later pt was back to baseline. Per caregiver this happens to pt quite frequently without a cause. She was sen to the ED for further evaluation.    The history is provided by the patient, medical records and a caregiver (nursing staff at vascular office).       Past Medical History:  Diagnosis Date  . Abnormal uterine bleeding (AUB)  01/26/2016  . Anxiety and depression    mentally slow  . Chronic abdominal pain   . Depression   . Hematuria 01/26/2016  . Hypertension   . Leg pain   . Obesity   . Ovarian cyst 02/10/2016  . PUD (peptic ulcer disease)   . Reflux   . S/P colonoscopy 03/15/11   friable anal canal  . Sebaceous cyst of labia 01/26/2016  . Yeast vaginitis 09/19/2013    Patient Active Problem List   Diagnosis Date Noted  . Ovarian cyst 02/10/2016  . Abnormal uterine bleeding (AUB) 01/26/2016  . Hematuria 01/26/2016  . Sebaceous cyst of labia 01/26/2016  . Yeast vaginitis 09/19/2013  . Obesity 07/16/2011  . RECTAL BLEEDING 02/23/2011  . Abdominal pain 10/05/2010  . HYPERTENSION NEC 09/28/2010    Past Surgical History:  Procedure Laterality Date  . TOOTH EXTRACTION N/A 06/03/2014   Procedure: EXTRACTION WISDOM TEETH;  Surgeon: Gae Bon, DDS;  Location: [facility];  Service: Oral Surgery;  Laterality: N/A;  . TUBAL LIGATION       OB History    Gravida      Para      Term      Preterm      AB      Living  0     SAB      TAB      Ectopic      Multiple  Live Births              Family History  Problem Relation Age of Onset  . Colon polyps Father   . Hypertension Father   . Diabetes Father   . Seizures Mother   . Hypertension Mother   . Diabetes Mother   . Diabetes Paternal Grandmother   . Colon cancer Neg Hx     Social History   Tobacco Use  . Smoking status: Never Smoker  . Smokeless tobacco: Never Used  Vaping Use  . Vaping Use: Never used  Substance Use Topics  . Alcohol use: No  . Drug use: No    Home Medications Prior to Admission medications   Medication Sig Start Date End Date Taking? Authorizing Provider  acetaminophen (TYLENOL) 325 MG tablet Take 650 mg by mouth 4 (four) times daily as needed.    [provider]  allopurinol (ZYLOPRIM) 300 MG tablet Take 300 mg by mouth daily.  09/05/13   [provider]  ALPRAZolam Duanne Moron)  0.5 MG tablet Take 0.5 mg by mouth 3 (three) times daily as needed. Anxiety    [provider]  amLODipine-benazepril (LOTREL) 10-40 MG capsule Take 1 capsule by mouth daily.    [provider]  cetirizine (ZYRTEC) 10 MG tablet Take 10 mg by mouth daily.    [provider]  cloNIDine (CATAPRES) 0.2 MG tablet Take 0.2 mg by mouth 3 (three) times daily.     [provider]  COLCRYS 0.6 MG tablet Take 0.6 mg by mouth 2 (two) times daily.  09/15/13   [provider]  DULoxetine (CYMBALTA) 60 MG capsule Take 60 mg by mouth at bedtime.      [provider]  furosemide (LASIX) 40 MG tablet Take 40 mg by mouth daily.  09/15/13   [provider]  hydrochlorothiazide (MICROZIDE) 12.5 MG capsule Take 12.5 mg by mouth daily.    [provider]  HYDROcodone-acetaminophen (NORCO/VICODIN) 5-325 MG per tablet Take 1 tablet by mouth 4 (four) times daily.  12/13/14   [provider]  lactulose (CHRONULAC) 10 GM/15ML solution Take 20 g by mouth at bedtime.    [provider]  medroxyPROGESTERone (PROVERA) 10 MG tablet TAKE 1 TABLET ONCE DAILY FOR 2 WEEKS. REPEAT EVERY 3 MONTHS 12/23/19   [name], MD  metoprolol (LOPRESSOR) 100 MG tablet 100 mg 2 (two) times daily.  11/29/14   [provider]  mupirocin ointment (BACTROBAN) 2 % Apply 1 application topically 2 (two) times daily.  06/10/16   [provider]  omeprazole (PRILOSEC) 20 MG capsule Take 20 mg by mouth daily.      [provider]    Allergies    Patient has no allergy information on record.  Review of Systems   Review of Systems  Constitutional: Positive for fatigue. Negative for chills and fever.  Respiratory: Negative for cough and shortness of breath.   Cardiovascular: Negative for chest pain.  Gastrointestinal: Positive for abdominal pain, nausea and vomiting. Negative for constipation and diarrhea.  Neurological: Positive for  syncope.    Physical Exam Updated Vital Signs BP 138/84 (BP Location: Right Arm)   Pulse 91   Temp 97.8 F (36.6 C) (Oral)   Resp 20   SpO2 100%   Physical Exam Vitals and nursing note reviewed.  Constitutional:      Appearance: She is obese. She is not ill-appearing or diaphoretic.  HENT:     Head: Normocephalic and  atraumatic.  Eyes:     Conjunctiva/sclera: Conjunctivae normal.  Cardiovascular:     Rate and Rhythm: Normal rate and regular rhythm.  Pulmonary:     Effort: Pulmonary effort is normal.     Breath sounds: Normal breath sounds.  Abdominal:     Palpations: Abdomen is soft.     Tenderness: There is abdominal tenderness. There is no guarding or rebound.     Comments: Soft, mild diffuse abdominal TTP, +BS throughout, no r/g/r, neg murphy's, neg mcburney's, no CVA TTP  Musculoskeletal:     Cervical back: Neck supple.  Skin:    General: Skin is warm and dry.  Neurological:     Mental Status: She is alert. Mental status is at baseline.     ED Results / Procedures / Treatments   Labs (all labs ordered are listed, but only abnormal results are displayed) Labs Reviewed  BASIC METABOLIC PANEL - Abnormal; Notable for the following components:      Result Value   Potassium 3.1 (*)    BUN 22 (*)    Creatinine, Ser 6.97 (*)    GFR calc non Af Amer 7 (*)    GFR calc Af Amer 8 (*)    All other components within normal limits  CBC - Abnormal; Notable for the following components:   WBC 13.0 (*)    RBC 3.66 (*)    Hemoglobin 10.5 (*)    HCT 33.3 (*)    RDW 18.6 (*)    Platelets 486 (*)    All other components within normal limits  URINALYSIS, ROUTINE W REFLEX MICROSCOPIC - Abnormal; Notable for the following components:   Color, Urine AMBER (*)    APPearance CLOUDY (*)    Protein, ur 30 (*)    Leukocytes,Ua LARGE (*)    Bacteria, UA MANY (*)    All other components within normal limits  LIPASE, BLOOD - Abnormal; Notable for the following components:   Lipase  53 (*)    All other components within normal limits  HEPATIC FUNCTION PANEL - Abnormal; Notable for the following components:   Total Protein 5.6 (*)    Albumin 3.1 (*)    AST 12 (*)    All other components within normal limits  SARS CORONAVIRUS 2 BY RT PCR (HOSPITAL ORDER, [facility])  CBG MONITORING, ED  I-STAT BETA HCG BLOOD, ED ([facility], [facility], [facility] ONLY)    EKG EKG Interpretation  Date/Time:  Friday September 05 2020 12:04:11 EDT Ventricular Rate:  104 PR Interval:  122 QRS Duration: 94 QT Interval:  366 QTC Calculation: 481 R Axis:   -46 Text Interpretation: Sinus tachycardia Left anterior fascicular block Abnormal ECG Confirmed by Quintella Reichert [phone]) on 09/05/2020 5:27:06 PM   Radiology DG Chest Port 1 View  Result Date: 09/05/2020 CLINICAL DATA:  Dizziness with near syncope. Rule out fluid overload. EXAM: PORTABLE CHEST 1 VIEW COMPARISON:  Radiograph 01/31/2020 FINDINGS: Right dialysis catheter tips in the SVC. Lung volumes are low. Heart is normal in size. Unchanged mediastinal contours. No pulmonary edema or pleural effusion. Minor atelectasis in the lung bases. No confluent airspace disease. No acute osseous abnormalities are seen. IMPRESSION: Low lung volumes with bibasilar atelectasis. No findings of fluid overload. Electronically Signed   By: Keith Rake M.D.   On: 09/05/2020 16:44   VAS [place] UPPER EXTREMITY ARTERIAL DUPLEX  Result Date: 09/05/2020 UPPER EXTREMITY DUPLEX STUDY Indications: Preoperative exam.  Performing Technologist: Alvia Grove RVT  Examination  Guidelines: A complete evaluation includes B-mode imaging, spectral Doppler, color Doppler, and power Doppler as needed of all accessible portions of each vessel. Bilateral testing is considered an integral part of a complete examination. Limited examinations for reoccurring indications may be performed as noted.  Right Pre-Dialysis Findings:  +-----------------------+----------+--------------------+---------+--------+ Location               PSV (cm/s)Intralum. Diam. (cm)Waveform Comments +-----------------------+----------+--------------------+---------+--------+ Brachial Antecub. fossa72        0.29                triphasic         +-----------------------+----------+--------------------+---------+--------+ Radial Art at Wrist    42        0.16                triphasic         +-----------------------+----------+--------------------+---------+--------+ Ulnar Art at Wrist     41        0.13                triphasic         +-----------------------+----------+--------------------+---------+--------+ Left Pre-Dialysis Findings: +-----------------------+----------+--------------------+---------+--------+ Location               PSV (cm/s)Intralum. Diam. (cm)Waveform Comments +-----------------------+----------+--------------------+---------+--------+ Brachial Antecub. fossa63        0.23                triphasic         +-----------------------+----------+--------------------+---------+--------+ Radial Art at Wrist    42        0.14                triphasic         +-----------------------+----------+--------------------+---------+--------+ Ulnar Art at Wrist     20        0.13                triphasic         +-----------------------+----------+--------------------+---------+--------+  Summary:   Measurements above. *See table(s) above for measurements and observations. Electronically signed by Servando Snare MD on 09/05/2020 at 5:25:50 PM.    Final     Procedures Procedures (including critical care time)  Medications Ordered in ED Medications  sodium chloride 0.9 % bolus 500 mL (0 mLs Intravenous Stopped 09/05/20 1357)  sodium chloride 0.9 % bolus 250 mL (0 mLs Intravenous Stopped 09/05/20 1530)    ED Course  I have reviewed the triage vital signs and the nursing notes.  Pertinent labs & imaging  results that were available during my care of the patient were reviewed by me and considered in my medical decision making (see chart for details).    MDM Rules/Calculators/A&P                          44 year old female with history of developmental delay and ESRD on dialysis Tuesday Thursday Saturday who presents to the ED today via EMS from vascular and vein specialist office Dr. Donzetta Matters after syncopal episode.  On arrival to the ED patient is afebrile.  She is noted to be tachycardic in the 130s and hypotensive 73/57.  She was noted to have mild epigastric abdominal tenderness on exam.  It does appear patient recently seen at [facility] ED for acute pancreatitis.  She was given 500 cc fluid bolus and had lab work done.  She was reevaluated 2 hours later with improvement in heart rate and blood pressure.  She was given an additional 250 cc  fluid bolus.  Lab work from while in the waiting room with a white blood cell count of 13,000.  Hemoglobin stable at 10.5.  BMP with a potassium 3.1, creatinine 6.97, BUN 22.  No gap.  No other electrolyte abnormalities.  LFTs within normal limits. Lipase 53.   Patient is brought back to her room she states she is feeling better.  He still is having some mild abdominal pain.  She does report she is still vomiting at home despite apparently being prescribed Zofran during her ED visit last week for acute pancreatitis on 09/06.  At that time lipase 241; creatinine 7.11.  Again patient is dialysis patient.  She cannot tell me when she last went to dialysis however per caregiver she went twice this week both on Saturday and Tuesday, missed yesterday however planning to go back tomorrow.  She reports that patient frequently passes and this was nothing needed today.  Plan to obtain chest x-ray given missed dialysis appointments to assess for fluid overload as well as orthostatic vital signs.    CXR without volume overload Pt is still orthostatic despite 750 CCs fluid; given  she is a dialysis pt do not want to fluid overload her. Have discussed this with attending physician Dr. Ralene Bathe; given pt is here again with abdominal pain and dehydration will repeat CT scan. Pt may require admission for IV hydration and monitoring.  Orthostatic Lying  BP- Lying:114/61 Pulse- Lying:70 Orthostatic Sitting BP- Sitting:110/64 Pulse- Sitting:94 Orthostatic Standing at 0 minutes BP- Standing at 0 minutes:85/57 (!) Pulse- Standing at 0 minutes:115 Orthostatic Standing at 3 minutes BP- Standing at 3 minutes:90/52 Pulse- Standing at 3 minutes:110  At shift change case signed out to attending physician Dr. Ralene Bathe who will dispo patient accordingly.   This note was prepared using Dragon voice recognition software and may include unintentional dictation errors due to the inherent limitations of voice recognition software.   Final Clinical Impression(s) / ED Diagnoses Final diagnoses:  None    Rx / DC Orders ED Discharge Orders    None       Eustaquio Maize, PA-C 09/05/20 1827    Quintella Reichert, MD 09/05/20 [phone]

## 2020-09-05 NOTE — Telephone Encounter (Signed)
09/05/20: Patient's ultrasounds were cxl while she was in the office due to her health condition, Per Dr Donzetta Matters.   [facility]

## 2020-09-05 NOTE — H&P (Addendum)
History and Physical    Tamara [name]:[id number] DOB: [birth year] DOA: 09/05/2020  PCP: Sinda Du, MD   Patient coming from: Home   Chief Complaint: Syncope, abdominal pain, N/V   HPI: [name] is a 44 y.o. female with medical history significant for developmental delay, history of hypertension, Covid with ARDS in February 2021, ESRD on hemodialysis, and depression with anxiety, now presenting to the emergency department after syncopal episode.  Patient reports upper abdominal pain for at least a week, has had nausea with frequent episodes of vomiting, missed dialysis yesterday due to this, and was at the vascular surgery clinic today for an ultrasound when she had a syncopal episode while seated.  Patient reports that she has been lightheaded when sitting up or standing but did not notice any chest pain or palpitations.  She denies shortness of breath, cough, hemoptysis, or leg swelling or tenderness.  She reports a loss of appetite and frequent vomiting.  She denies diarrhea or fevers.  She denies dysuria, hematuria, or flank pain.  ED Course: Upon arrival to the ED, patient is found to be afebrile, saturating well on room air, tachycardic initially, and with blood pressure as low 73/57.  EKG features sinus tachycardia with LAFB.  Chest x-ray is notable for low volumes, basilar atelectasis, but no evidence for fluid overload.  CT of the abdomen and pelvis demonstrates peripancreatic infiltration suggestive of acute pancreatitis.  Chemistry panel notable for potassium 3.1, normal bicarbonate, and BUN 22.  CBC with leukocytosis to 13,000, stable normocytic anemia, and mild thrombocytosis.  Patient was given 750 cc of saline in the ED but continues to be hypotensive and lightheaded on standing.  Covid screening test was negative.  Review of Systems:  All other systems reviewed and apart from HPI, are negative.  Past Medical History:  Diagnosis Date  . Abnormal uterine bleeding (AUB)  01/26/2016  . Anxiety and depression    mentally slow  . Chronic abdominal pain   . Depression   . Hematuria 01/26/2016  . Hypertension   . Leg pain   . Obesity   . Ovarian cyst 02/10/2016  . PUD (peptic ulcer disease)   . Reflux   . S/P colonoscopy 03/15/11   friable anal canal  . Sebaceous cyst of labia 01/26/2016  . Yeast vaginitis 09/19/2013    Past Surgical History:  Procedure Laterality Date  . TOOTH EXTRACTION N/A 06/03/2014   Procedure: EXTRACTION WISDOM TEETH;  Surgeon: Gae Bon, DDS;  Location: [facility];  Service: Oral Surgery;  Laterality: N/A;  . TUBAL LIGATION      Social History:   reports that she has never smoked. She has never used smokeless tobacco. She reports that she does not drink alcohol and does not use drugs.  No Known Allergies  Family History  Problem Relation Age of Onset  . Colon polyps Father   . Hypertension Father   . Diabetes Father   . Seizures Mother   . Hypertension Mother   . Diabetes Mother   . Diabetes Paternal Grandmother   . Colon cancer Neg Hx      Prior to Admission medications   Medication Sig Start Date End Date Taking? Authorizing Provider  acetaminophen (TYLENOL) 325 MG tablet Take 650 mg by mouth 4 (four) times daily as needed for moderate pain.    Yes [provider]  allopurinol (ZYLOPRIM) 300 MG tablet Take 300 mg by mouth daily.  09/05/13  Yes [provider]  ALPRAZolam (  XANAX) 0.5 MG tablet Take 0.5 mg by mouth 3 (three) times daily as needed. Anxiety   Yes [provider]  amLODipine-benazepril (LOTREL) 10-40 MG capsule Take 1 capsule by mouth daily.   Yes [provider]  cetirizine (ZYRTEC) 10 MG tablet Take 10 mg by mouth daily.   Yes [provider]  cloNIDine (CATAPRES) 0.2 MG tablet Take 0.2 mg by mouth 3 (three) times daily.    Yes [provider]  COLCRYS 0.6 MG tablet Take 0.6 mg by mouth 2 (two) times daily.  09/15/13  Yes [provider]    DULoxetine (CYMBALTA) 60 MG capsule Take 60 mg by mouth at bedtime.     Yes [provider]  furosemide (LASIX) 40 MG tablet Take 40 mg by mouth daily.  09/15/13  Yes [provider]  hydrochlorothiazide (MICROZIDE) 12.5 MG capsule Take 12.5 mg by mouth daily.   Yes [provider]  HYDROcodone-acetaminophen (NORCO/VICODIN) 5-325 MG per tablet Take 1 tablet by mouth 4 (four) times daily.  12/13/14  Yes [provider]  lactulose (CHRONULAC) 10 GM/15ML solution Take 20 g by mouth at bedtime.   Yes [provider]  medroxyPROGESTERone (PROVERA) 10 MG tablet TAKE 1 TABLET ONCE DAILY FOR 2 WEEKS. REPEAT EVERY 3 MONTHS Patient taking differently: Take 10 mg by mouth See admin instructions. Take 1 tablet for 2 weeks, repeat every 3 months 12/23/19  Yes [name], MD  metoprolol (LOPRESSOR) 100 MG tablet Take 100 mg by mouth 2 (two) times daily.  11/29/14  Yes [provider]  mirtazapine (REMERON) 15 MG tablet Take 15 mg by mouth at bedtime. 08/27/20  Yes [provider]  omeprazole (PRILOSEC) 20 MG capsule Take 20 mg by mouth daily.     Yes [provider]    Physical Exam: Vitals:   09/05/20 1700 09/05/20 1745 09/05/20 1800 09/05/20 2030  BP: 117/70 119/77 124/70 116/71  Pulse: 67 68 69   Resp: 15 14 (!) 27 14  Temp:      TempSrc:      SpO2: 100% 100% 100%   Weight:      Height:        Constitutional: NAD, calm  Eyes: PERTLA, lids and conjunctivae normal ENMT: Mucous membranes are moist. Posterior pharynx clear of any exudate or lesions.   Neck: normal, supple, no masses, no thyromegaly Respiratory: no wheezing, no crackles. No accessory muscle use.  Cardiovascular: S1 & S2 heard, regular rate and rhythm. No extremity edema.  Abdomen: No distension, soft, tender in epigastrium without rebound pain or guarding. Bowel sounds active.  Musculoskeletal: no clubbing / cyanosis. No joint deformity upper and lower  extremities.   Skin: poor turgor, xerosis. Warm, dry, well-perfused. Neurologic: CN 2-12 grossly intact. Sensation intact. Moving all extremities.  Psychiatric: Alert and oriented to person, place, and situation. Pleasant and cooperative.    Labs and Imaging on Admission: I have personally reviewed following labs and imaging studies  CBC: Recent Labs  Lab 09/05/20 1227  WBC 13.0*  HGB 10.5*  HCT 33.3*  MCV 91.0  PLT 350*   Basic Metabolic Panel: Recent Labs  Lab 09/05/20 1227  NA 140  K 3.1*  CL 99  CO2 26  GLUCOSE 92  BUN 22*  CREATININE 6.97*  CALCIUM 9.1   GFR: Estimated Creatinine Clearance: 12.6 mL/min (A) (by C-G formula based on SCr of 6.97 mg/dL (H)). Liver Function Tests: Recent Labs  Lab 09/05/20 1227  AST 12*  ALT 11  ALKPHOS 75  BILITOT 1.1  PROT 5.6*  ALBUMIN 3.1*   Recent Labs  Lab 09/05/20 1227  LIPASE 53*   No results for input(s): AMMONIA in the last 168 hours. Coagulation Profile: No results for input(s): INR, PROTIME in the last 168 hours. Cardiac Enzymes: No results for input(s): CKTOTAL, CKMB, CKMBINDEX, TROPONINI in the last 168 hours. BNP (last 3 results) No results for input(s): PROBNP in the last 8760 hours. HbA1C: No results for input(s): HGBA1C in the last 72 hours. CBG: Recent Labs  Lab 09/05/20 1621  GLUCAP 95   Lipid Profile: No results for input(s): CHOL, HDL, LDLCALC, TRIG, CHOLHDL, LDLDIRECT in the last 72 hours. Thyroid Function Tests: No results for input(s): TSH, T4TOTAL, FREET4, T3FREE, THYROIDAB in the last 72 hours. Anemia Panel: No results for input(s): VITAMINB12, FOLATE, FERRITIN, TIBC, IRON, RETICCTPCT in the last 72 hours. Urine analysis:    Component Value Date/Time   COLORURINE AMBER (A) 09/05/2020 1755   APPEARANCEUR CLOUDY (A) 09/05/2020 1755   APPEARANCEUR Clear 09/16/2016 1640   LABSPEC 1.016 09/05/2020 1755   PHURINE 5.0 09/05/2020 [place] 09/05/2020 1755   HGBUR NEGATIVE  09/05/2020 1755   BILIRUBINUR NEGATIVE 09/05/2020 1755   BILIRUBINUR Negative 09/16/2016 1640   KETONESUR NEGATIVE 09/05/2020 1755   PROTEINUR 30 (A) 09/05/2020 1755   UROBILINOGEN 0.2 01/14/2010 1638   NITRITE NEGATIVE 09/05/2020 1755   LEUKOCYTESUR LARGE (A) 09/05/2020 1755   Sepsis Labs: @LABRCNTIP (procalcitonin:4,lacticidven:4) ) Recent Results (from the past 240 hour(s))  SARS Coronavirus 2 by RT PCR (hospital order, performed in [facility] hospital lab) Nasopharyngeal Nasopharyngeal Swab     Status: None   Collection Time: 09/05/20  6:05 PM   Specimen: Nasopharyngeal Swab  Result Value Ref Range Status   SARS Coronavirus 2 NEGATIVE NEGATIVE Final    Comment: (NOTE) SARS-CoV-2 target nucleic acids are NOT DETECTED.  The SARS-CoV-2 RNA is generally detectable in upper and lower respiratory specimens during the acute phase of infection. The lowest concentration of SARS-CoV-2 viral copies this assay can detect is 250 copies / mL. A negative result does not preclude SARS-CoV-2 infection and should not be used as the sole basis for treatment or other patient management decisions.  A negative result may occur with improper specimen collection / handling, submission of specimen other than nasopharyngeal swab, presence of viral mutation(s) within the areas targeted by this assay, and inadequate number of viral copies (<250 copies / mL). A negative result must be combined with clinical observations, patient history, and epidemiological information.  Fact Sheet for Patients:   StrictlyIdeas.no  Fact Sheet for Healthcare Providers: BankingDealers.co.za  This test is not yet approved or  cleared by the [place] FDA and has been authorized for detection and/or diagnosis of SARS-CoV-2 by FDA under an Emergency Use Authorization (EUA).  This EUA will remain in effect (meaning this test can be used) for the duration of the COVID-19  declaration under Section 564(b)(1) of the Act, 21 U.S.C. section 360bbb-3(b)(1), unless the authorization is terminated or revoked sooner.  Performed at [facility], [place] [address], [place], [place] [address]      Radiological Exams on Admission: CT Abdomen Pelvis Wo Contrast  Result Date: 09/05/2020 CLINICAL DATA:  Near syncope after dizziness. Generalized weakness and abdominal pain EXAM: CT ABDOMEN AND PELVIS WITHOUT CONTRAST TECHNIQUE: Multidetector CT imaging of the abdomen and pelvis was performed following the standard protocol without IV contrast. COMPARISON:  09/01/2020 FINDINGS: Lower  chest: Lung bases are clear. Hepatobiliary: No focal liver abnormality is seen. No gallstones, gallbladder wall thickening, or biliary dilatation. Pancreas: Peripancreatic infiltration suggesting acute pancreatitis. No pancreatic ductal dilatation. Absence of pancreatic fat may indicate autoimmune pancreatitis although this distinction would better be made on contrast-enhanced examination. Spleen: Normal in size without focal abnormality. Adrenals/Urinary Tract: Adrenal glands are unremarkable. Kidneys are normal, without renal calculi, focal lesion, or hydronephrosis. Bladder is unremarkable. Stomach/Bowel: Stomach, small bowel, and colon are not abnormally distended. No wall thickening or inflammatory changes are appreciated. The appendix is normal. Vascular/Lymphatic: Normal caliber abdominal aorta. No significant lymphadenopathy. Flattened IVC may indicate hypovolemia. Reproductive: Complex cystic lesion in the left ovary measuring 4.1 cm diameter. No change since previous study. Uterus and right ovary are normal. Other: No free air or free fluid in the abdomen. Abdominal wall musculature appears intact. Musculoskeletal: No destructive bone lesions. IMPRESSION: 1. Peripancreatic infiltration suggesting acute pancreatitis. No pancreatic ductal dilatation. Absence of pancreatic fat may indicate  autoimmune pancreatitis although this distinction would better be made on contrast-enhanced examination. 2. Complex cystic lesion in the left ovary measuring 4.1 cm diameter. No change. 3. Flattened IVC may indicate hypovolemia. Electronically Signed   By: Lucienne Capers M.D.   On: 09/05/2020 21:21   DG Chest Port 1 View  Result Date: 09/05/2020 CLINICAL DATA:  Dizziness with near syncope. Rule out fluid overload. EXAM: PORTABLE CHEST 1 VIEW COMPARISON:  Radiograph 01/31/2020 FINDINGS: Right dialysis catheter tips in the SVC. Lung volumes are low. Heart is normal in size. Unchanged mediastinal contours. No pulmonary edema or pleural effusion. Minor atelectasis in the lung bases. No confluent airspace disease. No acute osseous abnormalities are seen. IMPRESSION: Low lung volumes with bibasilar atelectasis. No findings of fluid overload. Electronically Signed   By: Keith Rake M.D.   On: 09/05/2020 16:44   VAS [place] UPPER EXTREMITY ARTERIAL DUPLEX  Result Date: 09/05/2020 UPPER EXTREMITY DUPLEX STUDY Indications: Preoperative exam.  Performing Technologist: Alvia Grove RVT  Examination Guidelines: A complete evaluation includes B-mode imaging, spectral Doppler, color Doppler, and power Doppler as needed of all accessible portions of each vessel. Bilateral testing is considered an integral part of a complete examination. Limited examinations for reoccurring indications may be performed as noted.  Right Pre-Dialysis Findings: +-----------------------+----------+--------------------+---------+--------+ Location               PSV (cm/s)Intralum. Diam. (cm)Waveform Comments +-----------------------+----------+--------------------+---------+--------+ Brachial Antecub. fossa72        0.29                triphasic         +-----------------------+----------+--------------------+---------+--------+ Radial Art at Wrist    42        0.16                triphasic          +-----------------------+----------+--------------------+---------+--------+ Ulnar Art at Wrist     41        0.13                triphasic         +-----------------------+----------+--------------------+---------+--------+ Left Pre-Dialysis Findings: +-----------------------+----------+--------------------+---------+--------+ Location               PSV (cm/s)Intralum. Diam. (cm)Waveform Comments +-----------------------+----------+--------------------+---------+--------+ Brachial Antecub. fossa63        0.23                triphasic         +-----------------------+----------+--------------------+---------+--------+ Radial Art at Wrist  42        0.14                triphasic         +-----------------------+----------+--------------------+---------+--------+ Ulnar Art at Wrist     20        0.13                triphasic         +-----------------------+----------+--------------------+---------+--------+  Summary:   Measurements above. *See table(s) above for measurements and observations. Electronically signed by Servando Snare MD on 09/05/2020 at 5:25:50 PM.    Final     EKG: Independently reviewed. Sinus tachycardia (rate 104), LAFB.   Assessment/Plan   1. Syncope; orthostatic hypotension  - Presents from vascular surgery clinic after a syncopal episode that occurred while seated  - Patient reports lightheadedness with standing and has orthostatic hypotension, but event occurred while seated and sounds more consistent with a neurally mediated syncope  - She remains hypotensive on standing after 750 cc saline in ED  - She appears hypovolemic, CXR is clear, and IVC is flattened on CT abd/pelvis  - Continue gentle IVF hydration overnight, hold antihypertensives, repeat orthostatic vitals in am, EKG is abnormal and echo will be obtained as well    2. Acute on chronic pancreatitis  - Patient reports upper abdominal pain with N/V and has CT-findings consistent with acute  pancreatitis without fluid-collection, gall stones, or biliary dilatation  - LFTs are normal  - She has not required any analgesia in ED, reports pain has resolved for now, nausea improved with one dose Zofran  - Advance diet to renal diet as tolerated, continue a cautious IVF hydration overnight, check triglycerides, and continue as-needed Zofran   3. ESRD  - Normally dialyzed TTS but reports missing 9/9 session due to abdominal pain and N/V  - She appears hypovolemic on admission and there is no hyperkalemia, uremia, severe HTN, or acidosis to prompt urgent HD  - Gently hydrating as above, renally-dose medications, repeat chem panel in am    4. Hypokalemia  - Replace cautiously in ESRD pt, given 20 mEq PO, repeat chem panel in am    5. History of HTN  - She is on 6 antihypertensives but hypotensive in ED and these will be held   6. Depression, anxiety  - Continue home regimen    DVT prophylaxis: sq heparin  Code Status: Full  Family Communication: ED physician from prior shift discussed with caregiver Bonnita Nasuti) but phone number/contact info not currently available  Disposition Plan:  Patient is from: home  Anticipated d/c is to: Home  Anticipated d/c date is: 09/06/20 Patient currently: Orthostatic  Consults called: None  Admission status: Observation     Vianne Bulls, MD [facility]  09/05/2020, 11:13 PM

## 2020-09-05 NOTE — ED Triage Notes (Addendum)
Emergency Medicine Provider Triage Evaluation Note  Tamara [name] , a 44 y.o. female  was evaluated in triage.  Pt complains of generalized weakness.  Sent from Dr. Claretha Cooper office for evaluation, presents via EMS for near syncope.  She tells me right now she feels "fine".  Upon further questioning she admits that she has not gone to dialysis because she has not felt well.  She cannot tell me her dialysis days but I believe per chart review she is dialyzed on Tuesday Thursday Saturday.  She tells me that she is experiencing some abdominal pain mostly upper abdominal in the epigastric region.  Chart review shows that she was evaluated at an outside hospital on 09/01/2020 and was diagnosed with acute mild pancreatitis and was not hospitalized at that time.  She tells me she has not been eating or drinking as much.  She has a history of developmental delay and history is limited  Review of Systems  Positive: Malaise, abdominal pain, lightheadedness Negative: Headache, syncope, chest pain, shortness of breath  Physical Exam  BP (!) 88/64   Pulse (!) 107   Temp 97.8 F (36.6 C) (Oral)   Resp 20   SpO2 100%  Gen:   Awake, no distress   HEENT:  Atraumatic  Resp:  Normal effort  Cardiac:  Normal rate, tunneled catheter to the right chest with no erythema or drainage. Abd:   Nondistended, tender to palpation in the epigastrium with no rebound or guarding. MSK:   Moves extremities without difficulty Neuro:  Cranial nerves II through XII tested and intact.  Sensation intact to light touch of face and extremities.  5/5 strength of BUE and BLE major muscle groups.  No pronator drift.  Medical Decision Making  Medically screening exam initiated at 12:55 PM.  Appropriate orders placed.  Tamara [name] was informed that the remainder of the evaluation will be completed by another provider, this initial triage assessment does not replace that evaluation, and the importance of remaining in the ED until their  evaluation is complete.  Clinical Impression  Tachycardic and hypotensive in the ED.  Lungs clear to auscultation bilaterally and no evidence of respiratory distress.  We will start 500 cc fluid bolus, obtain lab work and plan to move into her room in the back as soon as 1 becomes available.  She has some epigastric abdominal pain, concern for worsening pancreatitis.   Renita Papa, PA-C 09/05/20 1258   1:21 PM Patient reassessed in triage, vital signs improved.  Will give 250 cc bolus and continue to monitor as possible until patient is able to be moved into her room in the back.   Renita Papa, PA-C 09/05/20 1321

## 2020-09-06 ENCOUNTER — Observation Stay (HOSPITAL_COMMUNITY): Payer: Medicare Other

## 2020-09-06 DIAGNOSIS — E876 Hypokalemia: Secondary | ICD-10-CM | POA: Diagnosis not present

## 2020-09-06 DIAGNOSIS — D631 Anemia in chronic kidney disease: Secondary | ICD-10-CM | POA: Diagnosis present

## 2020-09-06 DIAGNOSIS — K859 Acute pancreatitis without necrosis or infection, unspecified: Secondary | ICD-10-CM | POA: Diagnosis present

## 2020-09-06 DIAGNOSIS — K858 Other acute pancreatitis without necrosis or infection: Secondary | ICD-10-CM | POA: Diagnosis not present

## 2020-09-06 DIAGNOSIS — R109 Unspecified abdominal pain: Secondary | ICD-10-CM | POA: Diagnosis not present

## 2020-09-06 DIAGNOSIS — R55 Syncope and collapse: Secondary | ICD-10-CM | POA: Diagnosis not present

## 2020-09-06 DIAGNOSIS — K219 Gastro-esophageal reflux disease without esophagitis: Secondary | ICD-10-CM | POA: Diagnosis present

## 2020-09-06 DIAGNOSIS — F329 Major depressive disorder, single episode, unspecified: Secondary | ICD-10-CM | POA: Diagnosis present

## 2020-09-06 DIAGNOSIS — F419 Anxiety disorder, unspecified: Secondary | ICD-10-CM | POA: Diagnosis present

## 2020-09-06 DIAGNOSIS — Z79890 Hormone replacement therapy (postmenopausal): Secondary | ICD-10-CM | POA: Diagnosis not present

## 2020-09-06 DIAGNOSIS — E861 Hypovolemia: Secondary | ICD-10-CM | POA: Diagnosis present

## 2020-09-06 DIAGNOSIS — R8271 Bacteriuria: Secondary | ICD-10-CM | POA: Diagnosis present

## 2020-09-06 DIAGNOSIS — Z6838 Body mass index (BMI) 38.0-38.9, adult: Secondary | ICD-10-CM | POA: Diagnosis not present

## 2020-09-06 DIAGNOSIS — Z20822 Contact with and (suspected) exposure to covid-19: Secondary | ICD-10-CM | POA: Diagnosis present

## 2020-09-06 DIAGNOSIS — R7989 Other specified abnormal findings of blood chemistry: Secondary | ICD-10-CM | POA: Diagnosis present

## 2020-09-06 DIAGNOSIS — F418 Other specified anxiety disorders: Secondary | ICD-10-CM | POA: Diagnosis present

## 2020-09-06 DIAGNOSIS — Z992 Dependence on renal dialysis: Secondary | ICD-10-CM | POA: Diagnosis not present

## 2020-09-06 DIAGNOSIS — I444 Left anterior fascicular block: Secondary | ICD-10-CM | POA: Diagnosis present

## 2020-09-06 DIAGNOSIS — E669 Obesity, unspecified: Secondary | ICD-10-CM | POA: Diagnosis present

## 2020-09-06 DIAGNOSIS — N186 End stage renal disease: Secondary | ICD-10-CM | POA: Diagnosis not present

## 2020-09-06 DIAGNOSIS — J9811 Atelectasis: Secondary | ICD-10-CM | POA: Diagnosis present

## 2020-09-06 DIAGNOSIS — I120 Hypertensive chronic kidney disease with stage 5 chronic kidney disease or end stage renal disease: Secondary | ICD-10-CM | POA: Diagnosis not present

## 2020-09-06 DIAGNOSIS — Z8616 Personal history of COVID-19: Secondary | ICD-10-CM | POA: Diagnosis not present

## 2020-09-06 DIAGNOSIS — R6250 Unspecified lack of expected normal physiological development in childhood: Secondary | ICD-10-CM | POA: Diagnosis present

## 2020-09-06 DIAGNOSIS — I951 Orthostatic hypotension: Secondary | ICD-10-CM | POA: Diagnosis not present

## 2020-09-06 DIAGNOSIS — K861 Other chronic pancreatitis: Secondary | ICD-10-CM | POA: Diagnosis present

## 2020-09-06 LAB — BASIC METABOLIC PANEL
Anion gap: 15 (ref 5–15)
BUN: 25 mg/dL — ABNORMAL HIGH (ref 6–20)
CO2: 25 mmol/L (ref 22–32)
Calcium: 8.7 mg/dL — ABNORMAL LOW (ref 8.9–10.3)
Chloride: 101 mmol/L (ref 98–111)
Creatinine, Ser: 7.24 mg/dL — ABNORMAL HIGH (ref 0.44–1.00)
GFR calc Af Amer: 7 mL/min — ABNORMAL LOW (ref >60)
GFR calc non Af Amer: 6 mL/min — ABNORMAL LOW (ref >60)
Glucose, Bld: 81 mg/dL (ref 70–99)
Potassium: 2.9 mmol/L — ABNORMAL LOW (ref 3.5–5.1)
Sodium: 141 mmol/L (ref 135–145)

## 2020-09-06 LAB — GLUCOSE, CAPILLARY: Glucose-Capillary: 88 mg/dL (ref 70–99)

## 2020-09-06 LAB — ECHOCARDIOGRAM COMPLETE
Area-P 1/2: 4.8 cm2
Height: 65 in
S' Lateral: 2.4 cm
Weight: 3696.67 oz

## 2020-09-06 LAB — CBC
HCT: 28.5 % — ABNORMAL LOW (ref 36.0–46.0)
Hemoglobin: 9.2 g/dL — ABNORMAL LOW (ref 12.0–15.0)
MCH: 28.8 pg (ref 26.0–34.0)
MCHC: 32.3 g/dL (ref 30.0–36.0)
MCV: 89.3 fL (ref 80.0–100.0)
Platelets: 400 10*3/uL (ref 150–400)
RBC: 3.19 MIL/uL — ABNORMAL LOW (ref 3.87–5.11)
RDW: 18.4 % — ABNORMAL HIGH (ref 11.5–15.5)
WBC: 12.5 10*3/uL — ABNORMAL HIGH (ref 4.0–10.5)
nRBC: 0.2 % (ref 0.0–0.2)

## 2020-09-06 LAB — MAGNESIUM: Magnesium: 1.5 mg/dL — ABNORMAL LOW (ref 1.7–2.4)

## 2020-09-06 LAB — TRIGLYCERIDES: Triglycerides: 149 mg/dL (ref <150)

## 2020-09-06 LAB — HIV ANTIBODY (ROUTINE TESTING W REFLEX): HIV Screen 4th Generation wRfx: NONREACTIVE

## 2020-09-06 MED ORDER — MAGNESIUM SULFATE 2 GM/50ML IV SOLN
2.0000 g | Freq: Once | INTRAVENOUS | Status: AC
  Administered 2020-09-06: 2 g via INTRAVENOUS
  Filled 2020-09-06: qty 50

## 2020-09-06 MED ORDER — PERFLUTREN LIPID MICROSPHERE
1.0000 mL | INTRAVENOUS | Status: AC | PRN
  Administered 2020-09-06: 2 mL via INTRAVENOUS
  Filled 2020-09-06: qty 10

## 2020-09-06 MED ORDER — POTASSIUM CHLORIDE 10 MEQ/100ML IV SOLN
10.0000 meq | INTRAVENOUS | Status: AC
  Administered 2020-09-06 (×3): 10 meq via INTRAVENOUS
  Filled 2020-09-06 (×3): qty 100

## 2020-09-06 MED ORDER — POTASSIUM CHLORIDE CRYS ER 20 MEQ PO TBCR: 40.0000 meq | EXTENDED_RELEASE_TABLET | ORAL | Status: DC

## 2020-09-06 MED ORDER — SODIUM CHLORIDE 0.9 % IV SOLN: INTRAVENOUS | Status: AC

## 2020-09-06 MED ORDER — CHLORHEXIDINE GLUCONATE CLOTH 2 % EX PADS
6.0000 | MEDICATED_PAD | Freq: Every day | CUTANEOUS | Status: DC
  Administered 2020-09-06 – 2020-09-10 (×5): 6 via TOPICAL

## 2020-09-06 NOTE — Evaluation (Signed)
Physical Therapy Evaluation Patient Details Name: Tamara [name] MRN: [id number] DOB: [birth year] Today's Date: 09/06/2020   History of Present Illness  The pt is a 44 yo female presenting after a syncopal episode after prolonged nausea/vomiting that resulted in her missing HD. PMH includes: developmental delay, HTN, COVID with ARDS in Feb 2021, ESRD on HD, depression, and anxiety.  Clinical Impression  Pt in bed upon arrival of PT, agreeable to evaluation at this time. Prior to admission the pt was mobilizing at home with a rollator, receiving some assist from her mother for bathing and lower-body dressing. The pt now presents with limitations in functional mobility, activity tolerance, and strength due to above dx and generally reduced activity, and will continue to benefit from skilled PT to address these deficits. The pt was able to complete bed mobility without physical assistance and maintain seated balance at the EOB without assist, but declined further transfers or gait at this time reporting she was concerned about moving with the IV. Despite attempts at education and comforting the pt, she was unwilling to attempt to stand at this time. The pt was informed she will need to complete OOB mobility prior to return home and she voiced understanding. The pt was not orthostatic upon initial transition from supine to sitting EOB, but no further assessment could be completed. The pt will continue to benefit from skilled PT to progress OOB mobility and assess for additional therapies needed to allow for safe mobility in the home.   VITALS:  - supine- BP: 114/74 (88); HR: 106bpm - sitting EOB - BP: 124/88 (99); HR: 127bpm (HR max of 136 sitting EOB) - supine - 120/79 (92); HR: 112bpm      Follow Up Recommendations Home health PT;Supervision/Assistance - 24 hour (HHPT vs no PT follow-up pending OOB mobility)    Equipment Recommendations  None recommended by PT    Recommendations for Other  Services       Precautions / Restrictions Precautions Precautions: Fall Restrictions Weight Bearing Restrictions: No      Mobility  Bed Mobility Overal bed mobility: Needs Assistance Bed Mobility: Supine to Sit;Sit to Supine     Supine to sit: Min guard Sit to supine: Min guard   General bed mobility comments: no physical assist given, but use of bed rails and extra time, minG for line management and safety, cues for adjustments sitting EOB  Transfers Overall transfer level: Needs assistance (pt declined OOB transfers at this time)   Transfers: Lateral/Scoot Transfers          Lateral/Scoot Transfers: Min guard General transfer comment: minG for safety, pt was able to scoot laterally with good hip clearance towards the [facility]. she did not want to attempt standing at this time despite multiple attempts to comfort and appease pt  Ambulation/Gait                   Balance Overall balance assessment: Mild deficits observed, not formally tested                                           Pertinent Vitals/Pain Pain Assessment: No/denies pain    Home Living Family/patient expects to be discharged to:: Private residence Living Arrangements: Parent Available Help at Discharge: Family;Available 24 hours/day Type of Home: Apartment Home Access: Level entry     Home Layout: One level Home Equipment: Gilford Rile -  4 wheels;Shower seat;Grab bars - tub/shower;Hand held shower head      Prior Function Level of Independence: Needs assistance   Gait / Transfers Assistance Needed: walking with rollator at baseline  ADL's / Homemaking Assistance Needed: mother assists with showering (hair) and dressing (lower body dressing), as well as home making        Hand Dominance        Extremity/Trunk Assessment   Upper Extremity Assessment Upper Extremity Assessment: Generalized weakness    Lower Extremity Assessment Lower Extremity Assessment: Generalized  weakness    Cervical / Trunk Assessment Cervical / Trunk Assessment: Normal  Communication      Cognition Arousal/Alertness: Awake/alert Behavior During Therapy: Flat affect Overall Cognitive Status: No family/caregiver present to determine baseline cognitive functioning                                 General Comments: no caregiver to determine baseline cog, per chart pt with history of "developmental delay" able to respond to yes/no questions and simple ope-ended questions when given extra processing time. able to follow commands with extra time      General Comments General comments (skin integrity, edema, etc.): pt balance good in static sitting, declined further OOB mobility. Pt HR 106-120s mostly through session high of 136 sitting EOB        Assessment/Plan    PT Assessment Patient needs continued PT services  PT Problem List Decreased strength;Decreased mobility;Decreased safety awareness;Obesity;Decreased activity tolerance;Decreased balance       PT Treatment Interventions DME instruction;Therapeutic exercise;Gait training;Functional mobility training;Therapeutic activities;Patient/family education;Balance training    PT Goals (Current goals can be found in the Care Plan section)  Acute Rehab PT Goals Patient Stated Goal: to go home PT Goal Formulation: With patient Time For Goal Achievement: 09/20/20 Potential to Achieve Goals: Good    Frequency Min 3X/week   Barriers to discharge  Pt currently declining OOB mobility         AM-PAC PT "6 Clicks" Mobility  Outcome Measure Help needed turning from your back to your side while in a flat bed without using bedrails?: None Help needed moving from lying on your back to sitting on the side of a flat bed without using bedrails?: A Little Help needed moving to and from a bed to a chair (including a wheelchair)?: A Lot Help needed standing up from a chair using your arms (e.g., wheelchair or bedside  chair)?: A Little Help needed to walk in hospital room?: A Lot Help needed climbing 3-5 steps with a railing? : A Lot 6 Click Score: 16    End of Session Equipment Utilized During Treatment: Gait belt Activity Tolerance: Patient limited by fatigue;Other (comment) (pt reports fear of IV pole hurting when she stands, unable to reorient or comfort pt to convince her to attempt standing) Patient left: in bed;with bed alarm set;with call bell/phone within reach Nurse Communication: Mobility status (pt not orthostatic, pt declining to stand) PT Visit Diagnosis: Muscle weakness (generalized) (M62.81);Difficulty in walking, not elsewhere classified (R26.2)    Time: 4268-3419 PT Time Calculation (min) (ACUTE ONLY): 23 min   Charges:   PT Evaluation $PT Eval Moderate Complexity: 1 Mod PT Treatments $Therapeutic Activity: 8-22 mins        Karma Ganja, PT, DPT   Acute Rehabilitation Department Pager #: [phone]   Otho Bellows 09/06/2020, 4:20 PM

## 2020-09-06 NOTE — Progress Notes (Signed)
  Echocardiogram 2D Echocardiogram with definity has been performed.  Tamara [name] M 09/06/2020, 9:05 AM

## 2020-09-06 NOTE — Progress Notes (Signed)
Progress Note    Tamara [name]  MVE:[id number] DOB: 27-Oct-1976  DOA: 09/05/2020 PCP: Sinda Du, MD    Brief Narrative:     Medical records reviewed and are as summarized below:  Tamara [name] is an 44 y.o. female with medical history significant for developmental delay, history of hypertension, Covid with ARDS in February 2021, ESRD on hemodialysis, and depression with anxiety, now presenting to the emergency department after syncopal episode.  Patient reports upper abdominal pain for at least a week, has had nausea with frequent episodes of vomiting, missed dialysis yesterday due to this, and was at the vascular surgery clinic today for an ultrasound when she had a syncopal episode while seated.  Patient reports that she has been lightheaded when sitting up or standing but did not notice any chest pain or palpitations.  She denies shortness of breath, cough, hemoptysis, or leg swelling or tenderness.  She reports a loss of appetite and frequent vomiting.  She denies diarrhea or fevers.  She denies dysuria, hematuria, or flank pain.  Assessment/Plan:   Principal Problem:   Recurrent syncope Active Problems:   Acute on chronic pancreatitis (HCC)   ESRD (end stage renal disease) (HCC)   Orthostatic hypotension   Hypokalemia   Asymptomatic bacteriuria   Syncope; orthostatic hypotension  - Presents from vascular surgery clinic after a syncopal episode that occurred while seated  - She remains hypotensive on standing after 750 cc saline in ED  -repeat orthostatics alarming with 3 min BP of 39/28 -- did not mention if patient was symptomatic-- repeat orthos improved -TED hose -check TSH/cortisol -recent COVID infection in May 2021: ? Post-acute COVID causing her OH   Acute on chronic pancreatitis  - Patient reports upper abdominal pain with N/V and has CT-findings consistent with acute pancreatitis without fluid-collection, gall stones, or biliary dilatation  - LFTs are  normal  - diet advanced and patient vomiting so will change back to liquid diet  ESRD  - Normally dialyzed TTS but reports missing 9/9 session due to abdominal pain and N/V  - She appears hypovolemic on admission and there is no hyperkalemia, uremia, severe HTN, or acidosis to prompt urgent HD  - nephology notified of admission   Hypokalemia /mypogamnesemia - Replace cautiously in ESRD pt  History of HTN  - She is on 6 antihypertensives at home -hold meds for now-- as she is orthostatic may have to allow for permissive supine HTN  Depression, anxiety  - Continue home regimen   obesity Body mass index is 38.45 kg/m.   Family Communication/Anticipated D/C date and plan/Code Status   DVT prophylaxis: heparin Code Status: Full Code.  Disposition Plan: Status is: Observation  The patient will require care spanning > 2 midnights and should be moved to inpatient because: Inpatient level of care appropriate due to severity of illness  Dispo: The patient is from: Home              Anticipated d/c is to: Home              Anticipated d/c date is: 2 days              Patient currently is not medically stable to d/c.         Medical Consultants:    Renal (if HD needed)  Subjective:   After she ate a regular diet she has been throwing up  Objective:    Vitals:   09/06/20 1052 09/06/20 1055  09/06/20 1058 09/06/20 1107  BP:      Pulse: (!) 129 (!) 119 (!) 132 98  Resp:      Temp:      TempSrc:      SpO2:      Weight:      Height:        Intake/Output Summary (Last 24 hours) at 09/06/2020 1120 Last data filed at 09/06/2020 0300 Gross per 24 hour  Intake 428.22 ml  Output --  Net 428.22 ml   Filed Weights   09/05/20 1625  Weight: 104.8 kg    Exam:  General: Appearance:    Obese female in no acute distress     Lungs:      respirations unlabored  Heart:    Normal heart rate.      Neurologic:   Awake, alert, answers simple questions, follows commands     Data Reviewed:   I have personally reviewed following labs and imaging studies:  Labs: Labs show the following:   Basic Metabolic Panel: Recent Labs  Lab 09/05/20 1227 09/06/20 0502  NA 140 141  K 3.1* 2.9*  CL 99 101  CO2 26 25  GLUCOSE 92 81  BUN 22* 25*  CREATININE 6.97* 7.24*  CALCIUM 9.1 8.7*  MG  --  1.5*   GFR Estimated Creatinine Clearance: 12.2 mL/min (A) (by C-G formula based on SCr of 7.24 mg/dL (H)). Liver Function Tests: Recent Labs  Lab 09/05/20 1227  AST 12*  ALT 11  ALKPHOS 75  BILITOT 1.1  PROT 5.6*  ALBUMIN 3.1*   Recent Labs  Lab 09/05/20 1227  LIPASE 53*   No results for input(s): AMMONIA in the last 168 hours. Coagulation profile No results for input(s): INR, PROTIME in the last 168 hours.  CBC: Recent Labs  Lab 09/05/20 1227 09/06/20 0502  WBC 13.0* 12.5*  HGB 10.5* 9.2*  HCT 33.3* 28.5*  MCV 91.0 89.3  PLT 486* 400   Cardiac Enzymes: No results for input(s): CKTOTAL, CKMB, CKMBINDEX, TROPONINI in the last 168 hours. BNP (last 3 results) No results for input(s): PROBNP in the last 8760 hours. CBG: Recent Labs  Lab 09/05/20 1621 09/06/20 0617  GLUCAP 95 88   D-Dimer: No results for input(s): DDIMER in the last 72 hours. Hgb A1c: No results for input(s): HGBA1C in the last 72 hours. Lipid Profile: Recent Labs    09/06/20 0502  TRIG 149   Thyroid function studies: No results for input(s): TSH, T4TOTAL, T3FREE, THYROIDAB in the last 72 hours.  Invalid input(s): FREET3 Anemia work up: No results for input(s): VITAMINB12, FOLATE, FERRITIN, TIBC, IRON, RETICCTPCT in the last 72 hours. Sepsis Labs: Recent Labs  Lab 09/05/20 1227 09/06/20 0502  WBC 13.0* 12.5*    Microbiology Recent Results (from the past 240 hour(s))  SARS Coronavirus 2 by RT PCR (hospital order, performed in [facility] hospital lab) Nasopharyngeal Nasopharyngeal Swab     Status: None   Collection Time: 09/05/20  6:05 PM   Specimen:  Nasopharyngeal Swab  Result Value Ref Range Status   SARS Coronavirus 2 NEGATIVE NEGATIVE Final    Comment: (NOTE) SARS-CoV-2 target nucleic acids are NOT DETECTED.  The SARS-CoV-2 RNA is generally detectable in upper and lower respiratory specimens during the acute phase of infection. The lowest concentration of SARS-CoV-2 viral copies this assay can detect is 250 copies / mL. A negative result does not preclude SARS-CoV-2 infection and should not be used as the sole basis for treatment  or other patient management decisions.  A negative result may occur with improper specimen collection / handling, submission of specimen other than nasopharyngeal swab, presence of viral mutation(s) within the areas targeted by this assay, and inadequate number of viral copies (<250 copies / mL). A negative result must be combined with clinical observations, patient history, and epidemiological information.  Fact Sheet for Patients:   StrictlyIdeas.no  Fact Sheet for Healthcare Providers: BankingDealers.co.za  This test is not yet approved or  cleared by the [place] FDA and has been authorized for detection and/or diagnosis of SARS-CoV-2 by FDA under an Emergency Use Authorization (EUA).  This EUA will remain in effect (meaning this test can be used) for the duration of the COVID-19 declaration under Section 564(b)(1) of the Act, 21 U.S.C. section 360bbb-3(b)(1), unless the authorization is terminated or revoked sooner.  Performed at [facility], [place] [address], [place], [place] [address]     Procedures and diagnostic studies:  CT Abdomen Pelvis Wo Contrast  Result Date: 09/05/2020 CLINICAL DATA:  Near syncope after dizziness. Generalized weakness and abdominal pain EXAM: CT ABDOMEN AND PELVIS WITHOUT CONTRAST TECHNIQUE: Multidetector CT imaging of the abdomen and pelvis was performed following the standard protocol without IV  contrast. COMPARISON:  09/01/2020 FINDINGS: Lower chest: Lung bases are clear. Hepatobiliary: No focal liver abnormality is seen. No gallstones, gallbladder wall thickening, or biliary dilatation. Pancreas: Peripancreatic infiltration suggesting acute pancreatitis. No pancreatic ductal dilatation. Absence of pancreatic fat may indicate autoimmune pancreatitis although this distinction would better be made on contrast-enhanced examination. Spleen: Normal in size without focal abnormality. Adrenals/Urinary Tract: Adrenal glands are unremarkable. Kidneys are normal, without renal calculi, focal lesion, or hydronephrosis. Bladder is unremarkable. Stomach/Bowel: Stomach, small bowel, and colon are not abnormally distended. No wall thickening or inflammatory changes are appreciated. The appendix is normal. Vascular/Lymphatic: Normal caliber abdominal aorta. No significant lymphadenopathy. Flattened IVC may indicate hypovolemia. Reproductive: Complex cystic lesion in the left ovary measuring 4.1 cm diameter. No change since previous study. Uterus and right ovary are normal. Other: No free air or free fluid in the abdomen. Abdominal wall musculature appears intact. Musculoskeletal: No destructive bone lesions. IMPRESSION: 1. Peripancreatic infiltration suggesting acute pancreatitis. No pancreatic ductal dilatation. Absence of pancreatic fat may indicate autoimmune pancreatitis although this distinction would better be made on contrast-enhanced examination. 2. Complex cystic lesion in the left ovary measuring 4.1 cm diameter. No change. 3. Flattened IVC may indicate hypovolemia. Electronically Signed   By: Lucienne Capers M.D.   On: 09/05/2020 21:21   DG Chest Port 1 View  Result Date: 09/05/2020 CLINICAL DATA:  Dizziness with near syncope. Rule out fluid overload. EXAM: PORTABLE CHEST 1 VIEW COMPARISON:  Radiograph 01/31/2020 FINDINGS: Right dialysis catheter tips in the SVC. Lung volumes are low. Heart is normal in  size. Unchanged mediastinal contours. No pulmonary edema or pleural effusion. Minor atelectasis in the lung bases. No confluent airspace disease. No acute osseous abnormalities are seen. IMPRESSION: Low lung volumes with bibasilar atelectasis. No findings of fluid overload. Electronically Signed   By: Keith Rake M.D.   On: 09/05/2020 16:44   VAS [place] UPPER EXTREMITY ARTERIAL DUPLEX  Result Date: 09/05/2020 UPPER EXTREMITY DUPLEX STUDY Indications: Preoperative exam.  Performing Technologist: Alvia Grove RVT  Examination Guidelines: A complete evaluation includes B-mode imaging, spectral Doppler, color Doppler, and power Doppler as needed of all accessible portions of each vessel. Bilateral testing is considered an integral part of a complete examination. Limited examinations for reoccurring  indications may be performed as noted.  Right Pre-Dialysis Findings: +-----------------------+----------+--------------------+---------+--------+ Location               PSV (cm/s)Intralum. Diam. (cm)Waveform Comments +-----------------------+----------+--------------------+---------+--------+ Brachial Antecub. fossa72        0.29                triphasic         +-----------------------+----------+--------------------+---------+--------+ Radial Art at Wrist    42        0.16                triphasic         +-----------------------+----------+--------------------+---------+--------+ Ulnar Art at Wrist     41        0.13                triphasic         +-----------------------+----------+--------------------+---------+--------+ Left Pre-Dialysis Findings: +-----------------------+----------+--------------------+---------+--------+ Location               PSV (cm/s)Intralum. Diam. (cm)Waveform Comments +-----------------------+----------+--------------------+---------+--------+ Brachial Antecub. fossa63        0.23                triphasic          +-----------------------+----------+--------------------+---------+--------+ Radial Art at Wrist    42        0.14                triphasic         +-----------------------+----------+--------------------+---------+--------+ Ulnar Art at Wrist     20        0.13                triphasic         +-----------------------+----------+--------------------+---------+--------+  Summary:   Measurements above. *See table(s) above for measurements and observations. Electronically signed by Servando Snare MD on 09/05/2020 at 5:25:50 PM.    Final    ECHOCARDIOGRAM COMPLETE  Result Date: 09/06/2020    ECHOCARDIOGRAM REPORT   Patient Name:   Tamara [name] Date of Exam: 09/06/2020 Medical Rec #:  [id number]     Height:       65.0 in Accession #:    [id number]    Weight:       231.0 lb Date of Birth:  [birth year]    BSA:          2.103 m Patient Age:    89 years      BP:           139/73 mmHg Patient Gender: F             HR:           98 bpm. Exam Location:  Inpatient Procedure: 2D Echo and Intracardiac Opacification Agent Indications:    Syncope 780.2 / R55  History:        Patient has no prior history of Echocardiogram examinations.                 Risk Factors:Hypertension. Past history of COVID 01/2020. End                 stage renal disease, Acute on chronic pancreatitis.  Sonographer:    Darlina Sicilian RDCS Referring Phys: [id number] [place]  1. There is a dynamic mid LV cavity systolic gradient of 74BULA due to hyperdynamic LVF. Left ventricular ejection fraction, by estimation, is 70 to 75%. The left ventricle has hyperdynamic function. The left ventricle has no regional wall motion abnormalities.  There is mild concentric left ventricular hypertrophy. Left ventricular diastolic parameters are consistent with Grade I diastolic dysfunction (impaired relaxation).  2. Right ventricular systolic function is normal. The right ventricular size is normal.  3. A small pericardial effusion is present. The  pericardial effusion is anterior to the right ventricle.  4. The mitral valve is normal in structure. No evidence of mitral valve regurgitation. No evidence of mitral stenosis.  5. The aortic valve is normal in structure. Aortic valve regurgitation is not visualized. No aortic stenosis is present.  6. The inferior vena cava is normal in size with greater than 50% respiratory variability, suggesting right atrial pressure of 3 mmHg. FINDINGS  Left Ventricle: There is a dynamic mid LV cavity systolic gradient of 27POEU due to hyperdynamic LVF. Left ventricular ejection fraction, by estimation, is 70 to 75%. The left ventricle has hyperdynamic function. The left ventricle has no regional wall motion abnormalities. Definity contrast agent was given IV to delineate the left ventricular endocardial borders. The left ventricular internal cavity size was normal in size. There is mild concentric left ventricular hypertrophy. Left ventricular diastolic parameters are consistent with Grade I diastolic dysfunction (impaired relaxation). Normal left ventricular filling pressure. Right Ventricle: The right ventricular size is normal. No increase in right ventricular wall thickness. Right ventricular systolic function is normal. Left Atrium: Left atrial size was normal in size. Right Atrium: Right atrial size was normal in size. Pericardium: A small pericardial effusion is present. The pericardial effusion is anterior to the right ventricle. Mitral Valve: The mitral valve is normal in structure. No evidence of mitral valve regurgitation. No evidence of mitral valve stenosis. Tricuspid Valve: The tricuspid valve is normal in structure. Tricuspid valve regurgitation is not demonstrated. No evidence of tricuspid stenosis. Aortic Valve: The aortic valve is normal in structure. Aortic valve regurgitation is not visualized. No aortic stenosis is present. Pulmonic Valve: The pulmonic valve was normal in structure. Pulmonic valve  regurgitation is trivial. No evidence of pulmonic stenosis. Aorta: The aortic root is normal in size and structure. Venous: The inferior vena cava is normal in size with greater than 50% respiratory variability, suggesting right atrial pressure of 3 mmHg. IAS/Shunts: No atrial level shunt detected by color flow Doppler.  LEFT VENTRICLE PLAX 2D LVIDd:         4.20 cm  Diastology LVIDs:         2.40 cm  LV e' medial:    5.59 cm/s LV PW:         1.30 cm  LV E/e' medial:  11.7 LV IVS:        1.20 cm  LV e' lateral:   8.15 cm/s LVOT diam:     2.00 cm  LV E/e' lateral: 8.0 LV SV:         78 LV SV Index:   37 LVOT Area:     3.14 cm  RIGHT VENTRICLE TAPSE (M-mode): 1.8 cm LEFT ATRIUM             Index       RIGHT ATRIUM          Index LA diam:        3.60 cm 1.71 cm/m  RA Area:     8.67 cm LA Vol (A2C):   35.8 ml 17.02 ml/m RA Volume:   14.20 ml 6.75 ml/m LA Vol (A4C):   24.2 ml 11.51 ml/m LA Biplane Vol: 30.8 ml 14.64 ml/m  AORTIC VALVE LVOT Vmax:  138.00 cm/s LVOT Vmean:  107.000 cm/s LVOT VTI:    0.247 m  AORTA Ao Root diam: 2.90 cm Ao Asc diam:  2.90 cm MITRAL VALVE MV Area (PHT): 4.80 cm     SHUNTS MV Decel Time: 158 msec     Systemic VTI:  0.25 m MV E velocity: 65.60 cm/s   Systemic Diam: 2.00 cm MV A velocity: 104.00 cm/s MV E/A ratio:  0.63 Fransico Him MD Electronically signed by Fransico Him MD Signature Date/Time: 09/06/2020/9:29:06 AM    Final     Medications:   . allopurinol  300 mg Oral Daily  . Chlorhexidine Gluconate Cloth  6 each Topical Daily  . DULoxetine  60 mg Oral QHS  . heparin  5,000 Units Subcutaneous Q8H  . mirtazapine  15 mg Oral QHS  . pantoprazole  40 mg Oral Daily  . sodium chloride flush  3 mL Intravenous Q12H   Continuous Infusions: . sodium chloride    . magnesium sulfate bolus IVPB    . potassium chloride       LOS: 0 days   Geradine Girt  [facility]   How to contact the [facility] Attending or Consulting provider [place] or covering provider during after  hours [place], for this patient?  1. Check the care team in [facility] and look for a) attending/consulting TR[facility] provider listed and b) the [facility] team listed 2. Log into [email or web address] and use [place]'s universal password to access. If you do not have the password, please contact the hospital operator. 3. Locate the [facility] provider you are looking for under [facility] and page to a number that you can be directly reached. 4. If you still have difficulty reaching the provider, please page the [facility] (Director on Call) for the Hospitalists listed on amion for assistance.  09/06/2020, 11:20 AM

## 2020-09-07 LAB — CBC
HCT: 30.6 % — ABNORMAL LOW (ref 36.0–46.0)
Hemoglobin: 10.2 g/dL — ABNORMAL LOW (ref 12.0–15.0)
MCH: 29.8 pg (ref 26.0–34.0)
MCHC: 33.3 g/dL (ref 30.0–36.0)
MCV: 89.5 fL (ref 80.0–100.0)
Platelets: 487 10*3/uL — ABNORMAL HIGH (ref 150–400)
RBC: 3.42 MIL/uL — ABNORMAL LOW (ref 3.87–5.11)
RDW: 18.6 % — ABNORMAL HIGH (ref 11.5–15.5)
WBC: 14.3 10*3/uL — ABNORMAL HIGH (ref 4.0–10.5)
nRBC: 0.3 % — ABNORMAL HIGH (ref 0.0–0.2)

## 2020-09-07 LAB — BASIC METABOLIC PANEL
Anion gap: 14 (ref 5–15)
BUN: 27 mg/dL — ABNORMAL HIGH (ref 6–20)
CO2: 23 mmol/L (ref 22–32)
Calcium: 9.3 mg/dL (ref 8.9–10.3)
Chloride: 102 mmol/L (ref 98–111)
Creatinine, Ser: 7.72 mg/dL — ABNORMAL HIGH (ref 0.44–1.00)
GFR calc Af Amer: 7 mL/min — ABNORMAL LOW (ref >60)
GFR calc non Af Amer: 6 mL/min — ABNORMAL LOW (ref >60)
Glucose, Bld: 91 mg/dL (ref 70–99)
Potassium: 3 mmol/L — ABNORMAL LOW (ref 3.5–5.1)
Sodium: 139 mmol/L (ref 135–145)

## 2020-09-07 LAB — URINE CULTURE

## 2020-09-07 LAB — CORTISOL: Cortisol, Plasma: 34.5 ug/dL

## 2020-09-07 LAB — TSH: TSH: 2.889 u[IU]/mL (ref 0.350–4.500)

## 2020-09-07 LAB — GLUCOSE, CAPILLARY
Glucose-Capillary: 65 mg/dL — ABNORMAL LOW (ref 70–99)
Glucose-Capillary: 66 mg/dL — ABNORMAL LOW (ref 70–99)
Glucose-Capillary: 99 mg/dL (ref 70–99)

## 2020-09-07 MED ORDER — DEXTROSE-NACL 5-0.45 % IV SOLN: INTRAVENOUS | Status: DC

## 2020-09-07 MED ORDER — DEXTROSE 50 % IV SOLN
INTRAVENOUS | Status: AC
  Administered 2020-09-07: 25 mL
  Filled 2020-09-07: qty 50

## 2020-09-07 MED ORDER — POTASSIUM CHLORIDE CRYS ER 20 MEQ PO TBCR
20.0000 meq | EXTENDED_RELEASE_TABLET | Freq: Once | ORAL | Status: AC
  Administered 2020-09-07: 20 meq via ORAL
  Filled 2020-09-07: qty 1

## 2020-09-07 NOTE — Progress Notes (Addendum)
Progress Note    Tamara [name]  HQP:[id number] DOB: 09-05-1976  DOA: 09/05/2020 PCP: Sinda Du, MD    Brief Narrative:     Medical records reviewed and are as summarized below:  Tamara [name] is an 44 y.o. female with medical history significant for developmental delay, history of hypertension, Covid with ARDS in February 2021, ESRD on hemodialysis, and depression with anxiety, now presenting to the emergency department after syncopal episode.  Patient reports upper abdominal pain for at least a week, has had nausea with frequent episodes of vomiting, missed dialysis yesterday due to this, and was at the vascular surgery clinic today for an ultrasound when she had a syncopal episode while seated.  Patient reports that she has been lightheaded when sitting up or standing but did not notice any chest pain or palpitations.  She denies shortness of breath, cough, hemoptysis, or leg swelling or tenderness.  She reports a loss of appetite and frequent vomiting.  She denies diarrhea or fevers.  She denies dysuria, hematuria, or flank pain.  Assessment/Plan:   Principal Problem:   Recurrent syncope Active Problems:   Acute on chronic pancreatitis (HCC)   ESRD (end stage renal disease) (HCC)   Orthostatic hypotension   Hypokalemia   Asymptomatic bacteriuria   Acute pancreatitis   Syncope; orthostatic hypotension  - Presents from vascular surgery clinic after a syncopal episode that occurred while seated  - She remains hypotensive on standing after 750 cc saline in ED  -repeat orthostatics alarming with 3 min BP of 39/28 -- did not mention if patient was symptomatic-- repeat orthos improved but still positive -TED hose -TSH normal -cortisol elevated but was not drawn until 9 AM-- ? significance -recent COVID infection in May 2021: ? Post-acute COVID causing her OH   Acute  pancreatitis  -could not find history of prior episodes in chart -CT scan suggestive of autoimmune- check  IGG4 levels - no fluid-collection, gall stones, or biliary dilatation  - LFTs are normal  - continue liquid diet ? Medication induced as on diuretics  ESRD  - Normally dialyzed TTS but reports missing 9/9 session due to abdominal pain and N/V  - She appears hypovolemic on admission and there is no hyperkalemia, uremia, severe HTN, or acidosis to prompt urgent HD  - nephology notified of admission - may need to check in again tomorrow -? Need for diuretic  Hypokalemia /hypomagnesemia - Replace cautiously in ESRD pt  History of HTN  - She is on 6 antihypertensives at home -hold meds for now-- as she is orthostatic may have to allow for permissive supine HTN  Depression, anxiety  - Continue home regimen   obesity Body mass index is 31.15 kg/m.   Family Communication/Anticipated D/C date and plan/Code Status   DVT prophylaxis: heparin Code Status: Full Code.  Disposition Plan: Status is: inpt  Remain inpt as not taking in much PO and still with OH  Dispo: The patient is from: Home              Anticipated d/c is to: Home              Anticipated d/c date is: 2 days              Patient currently is not medically stable to d/c.  Medical Consultants:    Renal (HD needed)  Subjective:   Became dizzy with standing again today causing her to vomit Blood sugar was low this AM  Objective:    Vitals:   09/07/20 0326 09/07/20 0339 09/07/20 0920 09/07/20 1156  BP: (!) 145/80   138/67  Pulse: (!) 128 91  100  Resp: 20   18  Temp: 98.2 F (36.8 C)   97.9 F (36.6 C)  TempSrc: Oral   Oral  SpO2: 100%   98%  Weight:   84.9 kg   Height:        Intake/Output Summary (Last 24 hours) at 09/07/2020 1245 Last data filed at 09/07/2020 0913 Gross per 24 hour  Intake 975 ml  Output 100 ml  Net 875 ml   Filed Weights   09/06/20 1141 09/06/20 2042 09/07/20 0920  Weight: 83.4 kg 82.8 kg 84.9 kg    Exam:  General: Appearance:    Obese female who appears comfortable    +BS, tender to palpation in epigastric region  Lungs:     respirations unlabored  Heart:    Tachycardic.   MS:   All extremities are intact.   Neurologic:   Awake, slow to respond, poor eye contact     Data Reviewed:   I have personally reviewed following labs and imaging studies:  Labs: Labs show the following:   Basic Metabolic Panel: Recent Labs  Lab 09/05/20 1227 09/05/20 1227 09/06/20 0502 09/07/20 0858  NA 140  --  141 139  K 3.1*   < > 2.9* 3.0*  CL 99  --  101 102  CO2 26  --  25 23  GLUCOSE 92  --  81 91  BUN 22*  --  25* 27*  CREATININE 6.97*  --  7.24* 7.72*  CALCIUM 9.1  --  8.7* 9.3  MG  --   --  1.5*  --    < > = values in this interval not displayed.   GFR Estimated Creatinine Clearance: 10.2 mL/min (A) (by C-G formula based on SCr of 7.72 mg/dL (H)). Liver Function Tests: Recent Labs  Lab 09/05/20 1227  AST 12*  ALT 11  ALKPHOS 75  BILITOT 1.1  PROT 5.6*  ALBUMIN 3.1*   Recent Labs  Lab 09/05/20 1227  LIPASE 53*   No results for input(s): AMMONIA in the last 168 hours. Coagulation profile No results for input(s): INR, PROTIME in the last 168 hours.  CBC: Recent Labs  Lab 09/05/20 1227 09/06/20 0502 09/07/20 0858  WBC 13.0* 12.5* 14.3*  HGB 10.5* 9.2* 10.2*  HCT 33.3* 28.5* 30.6*  MCV 91.0 89.3 89.5  PLT 486* 400 487*   Cardiac Enzymes: No results for input(s): CKTOTAL, CKMB, CKMBINDEX, TROPONINI in the last 168 hours. BNP (last 3 results) No results for input(s): PROBNP in the last 8760 hours. CBG: Recent Labs  Lab 09/05/20 1621 09/06/20 0617 09/07/20 0706 09/07/20 0804 09/07/20 0827  GLUCAP 95 88 65* 66* 99   D-Dimer: No results for input(s): DDIMER in the last 72 hours. Hgb A1c: No results for input(s): HGBA1C in the last 72 hours. Lipid Profile: Recent Labs    09/06/20 0502  TRIG 149   Thyroid function studies: Recent Labs    09/07/20 0858  TSH 2.889   Anemia work up: No results for input(s):  VITAMINB12, FOLATE, FERRITIN, TIBC, IRON, RETICCTPCT in the last 72 hours. Sepsis Labs: Recent Labs  Lab 09/05/20 1227 09/06/20 0502 09/07/20 0858  WBC 13.0* 12.5* 14.3*    Microbiology Recent Results (from the past 240 hour(s))  SARS Coronavirus 2 by RT PCR (hospital order, performed in [facility] hospital  lab) Nasopharyngeal Nasopharyngeal Swab     Status: None   Collection Time: 09/05/20  6:05 PM   Specimen: Nasopharyngeal Swab  Result Value Ref Range Status   SARS Coronavirus 2 NEGATIVE NEGATIVE Final    Comment: (NOTE) SARS-CoV-2 target nucleic acids are NOT DETECTED.  The SARS-CoV-2 RNA is generally detectable in upper and lower respiratory specimens during the acute phase of infection. The lowest concentration of SARS-CoV-2 viral copies this assay can detect is 250 copies / mL. A negative result does not preclude SARS-CoV-2 infection and should not be used as the sole basis for treatment or other patient management decisions.  A negative result may occur with improper specimen collection / handling, submission of specimen other than nasopharyngeal swab, presence of viral mutation(s) within the areas targeted by this assay, and inadequate number of viral copies (<250 copies / mL). A negative result must be combined with clinical observations, patient history, and epidemiological information.  Fact Sheet for Patients:   StrictlyIdeas.no  Fact Sheet for Healthcare Providers: BankingDealers.co.za  This test is not yet approved or  cleared by the [place] FDA and has been authorized for detection and/or diagnosis of SARS-CoV-2 by FDA under an Emergency Use Authorization (EUA).  This EUA will remain in effect (meaning this test can be used) for the duration of the COVID-19 declaration under Section 564(b)(1) of the Act, 21 U.S.C. section 360bbb-3(b)(1), unless the authorization is terminated or revoked  sooner.  Performed at [facility], [place] [address], [place], [place] [address]   Urine culture     Status: Abnormal   Collection Time: 09/05/20  6:55 PM   Specimen: Urine, Random  Result Value Ref Range Status   Specimen Description URINE, RANDOM  Final   Special Requests   Final    NONE Performed at [facility], [place] [address] [place], [place] [address]    Culture MULTIPLE SPECIES PRESENT, SUGGEST RECOLLECTION (A)  Final   Report Status 09/07/2020 FINAL  Final    Procedures and diagnostic studies:  CT Abdomen Pelvis Wo Contrast  Result Date: 09/05/2020 CLINICAL DATA:  Near syncope after dizziness. Generalized weakness and abdominal pain EXAM: CT ABDOMEN AND PELVIS WITHOUT CONTRAST TECHNIQUE: Multidetector CT imaging of the abdomen and pelvis was performed following the standard protocol without IV contrast. COMPARISON:  09/01/2020 FINDINGS: Lower chest: Lung bases are clear. Hepatobiliary: No focal liver abnormality is seen. No gallstones, gallbladder wall thickening, or biliary dilatation. Pancreas: Peripancreatic infiltration suggesting acute pancreatitis. No pancreatic ductal dilatation. Absence of pancreatic fat may indicate autoimmune pancreatitis although this distinction would better be made on contrast-enhanced examination. Spleen: Normal in size without focal abnormality. Adrenals/Urinary Tract: Adrenal glands are unremarkable. Kidneys are normal, without renal calculi, focal lesion, or hydronephrosis. Bladder is unremarkable. Stomach/Bowel: Stomach, small bowel, and colon are not abnormally distended. No wall thickening or inflammatory changes are appreciated. The appendix is normal. Vascular/Lymphatic: Normal caliber abdominal aorta. No significant lymphadenopathy. Flattened IVC may indicate hypovolemia. Reproductive: Complex cystic lesion in the left ovary measuring 4.1 cm diameter. No change since previous study. Uterus and right ovary are normal. Other: No free air  or free fluid in the abdomen. Abdominal wall musculature appears intact. Musculoskeletal: No destructive bone lesions. IMPRESSION: 1. Peripancreatic infiltration suggesting acute pancreatitis. No pancreatic ductal dilatation. Absence of pancreatic fat may indicate autoimmune pancreatitis although this distinction would better be made on contrast-enhanced examination. 2. Complex cystic lesion in the left ovary measuring 4.1 cm diameter. No change. 3.  Flattened IVC may indicate hypovolemia. Electronically Signed   By: Lucienne Capers M.D.   On: 09/05/2020 21:21   DG Chest Port 1 View  Result Date: 09/05/2020 CLINICAL DATA:  Dizziness with near syncope. Rule out fluid overload. EXAM: PORTABLE CHEST 1 VIEW COMPARISON:  Radiograph 01/31/2020 FINDINGS: Right dialysis catheter tips in the SVC. Lung volumes are low. Heart is normal in size. Unchanged mediastinal contours. No pulmonary edema or pleural effusion. Minor atelectasis in the lung bases. No confluent airspace disease. No acute osseous abnormalities are seen. IMPRESSION: Low lung volumes with bibasilar atelectasis. No findings of fluid overload. Electronically Signed   By: Keith Rake M.D.   On: 09/05/2020 16:44   ECHOCARDIOGRAM COMPLETE  Result Date: 09/06/2020    ECHOCARDIOGRAM REPORT   Patient Name:   Tamara [name] Date of Exam: 09/06/2020 Medical Rec #:  [id number]     Height:       65.0 in Accession #:    [id number]    Weight:       231.0 lb Date of Birth:  10-Nov-1976    BSA:          2.103 m Patient Age:    60 years      BP:           139/73 mmHg Patient Gender: F             HR:           98 bpm. Exam Location:  Inpatient Procedure: 2D Echo and Intracardiac Opacification Agent Indications:    Syncope 780.2 / R55  History:        Patient has no prior history of Echocardiogram examinations.                 Risk Factors:Hypertension. Past history of COVID 01/2020. End                 stage renal disease, Acute on chronic pancreatitis.  Sonographer:     Darlina Sicilian RDCS Referring Phys: [id number] [place]  1. There is a dynamic mid LV cavity systolic gradient of 09BDZH due to hyperdynamic LVF. Left ventricular ejection fraction, by estimation, is 70 to 75%. The left ventricle has hyperdynamic function. The left ventricle has no regional wall motion abnormalities. There is mild concentric left ventricular hypertrophy. Left ventricular diastolic parameters are consistent with Grade I diastolic dysfunction (impaired relaxation).  2. Right ventricular systolic function is normal. The right ventricular size is normal.  3. A small pericardial effusion is present. The pericardial effusion is anterior to the right ventricle.  4. The mitral valve is normal in structure. No evidence of mitral valve regurgitation. No evidence of mitral stenosis.  5. The aortic valve is normal in structure. Aortic valve regurgitation is not visualized. No aortic stenosis is present.  6. The inferior vena cava is normal in size with greater than 50% respiratory variability, suggesting right atrial pressure of 3 mmHg. FINDINGS  Left Ventricle: There is a dynamic mid LV cavity systolic gradient of 29JMEQ due to hyperdynamic LVF. Left ventricular ejection fraction, by estimation, is 70 to 75%. The left ventricle has hyperdynamic function. The left ventricle has no regional wall motion abnormalities. Definity contrast agent was given IV to delineate the left ventricular endocardial borders. The left ventricular internal cavity size was normal in size. There is mild concentric left ventricular hypertrophy. Left ventricular diastolic parameters are consistent with Grade I diastolic dysfunction (impaired relaxation). Normal left ventricular filling pressure.  Right Ventricle: The right ventricular size is normal. No increase in right ventricular wall thickness. Right ventricular systolic function is normal. Left Atrium: Left atrial size was normal in size. Right Atrium: Right atrial  size was normal in size. Pericardium: A small pericardial effusion is present. The pericardial effusion is anterior to the right ventricle. Mitral Valve: The mitral valve is normal in structure. No evidence of mitral valve regurgitation. No evidence of mitral valve stenosis. Tricuspid Valve: The tricuspid valve is normal in structure. Tricuspid valve regurgitation is not demonstrated. No evidence of tricuspid stenosis. Aortic Valve: The aortic valve is normal in structure. Aortic valve regurgitation is not visualized. No aortic stenosis is present. Pulmonic Valve: The pulmonic valve was normal in structure. Pulmonic valve regurgitation is trivial. No evidence of pulmonic stenosis. Aorta: The aortic root is normal in size and structure. Venous: The inferior vena cava is normal in size with greater than 50% respiratory variability, suggesting right atrial pressure of 3 mmHg. IAS/Shunts: No atrial level shunt detected by color flow Doppler.  LEFT VENTRICLE PLAX 2D LVIDd:         4.20 cm  Diastology LVIDs:         2.40 cm  LV e' medial:    5.59 cm/s LV PW:         1.30 cm  LV E/e' medial:  11.7 LV IVS:        1.20 cm  LV e' lateral:   8.15 cm/s LVOT diam:     2.00 cm  LV E/e' lateral: 8.0 LV SV:         78 LV SV Index:   37 LVOT Area:     3.14 cm  RIGHT VENTRICLE TAPSE (M-mode): 1.8 cm LEFT ATRIUM             Index       RIGHT ATRIUM          Index LA diam:        3.60 cm 1.71 cm/m  RA Area:     8.67 cm LA Vol (A2C):   35.8 ml 17.02 ml/m RA Volume:   14.20 ml 6.75 ml/m LA Vol (A4C):   24.2 ml 11.51 ml/m LA Biplane Vol: 30.8 ml 14.64 ml/m  AORTIC VALVE LVOT Vmax:   138.00 cm/s LVOT Vmean:  107.000 cm/s LVOT VTI:    0.247 m  AORTA Ao Root diam: 2.90 cm Ao Asc diam:  2.90 cm MITRAL VALVE MV Area (PHT): 4.80 cm     SHUNTS MV Decel Time: 158 msec     Systemic VTI:  0.25 m MV E velocity: 65.60 cm/s   Systemic Diam: 2.00 cm MV A velocity: 104.00 cm/s MV E/A ratio:  0.63 Fransico Him MD Electronically signed by Fransico Him MD Signature Date/Time: 09/06/2020/9:29:06 AM    Final     Medications:   . allopurinol  300 mg Oral Daily  . Chlorhexidine Gluconate Cloth  6 each Topical Daily  . DULoxetine  60 mg Oral QHS  . heparin  5,000 Units Subcutaneous Q8H  . mirtazapine  15 mg Oral QHS  . pantoprazole  40 mg Oral Daily  . sodium chloride flush  3 mL Intravenous Q12H   Continuous Infusions:    LOS: 1 day   [place] [facility]   How to contact the [facility] Attending or Consulting provider [place] or covering provider during after hours [place], for this patient?  1. Check the care team  in [facility] and look for a) attending/consulting [facility] provider listed and b) the [facility] team listed 2. Log into [email or web address] and use [place]'s universal password to access. If you do not have the password, please contact the hospital operator. 3. Locate the [facility] provider you are looking for under [facility] and page to a number that you can be directly reached. 4. If you still have difficulty reaching the provider, please page the [facility] (Director on Call) for the Hospitalists listed on amion for assistance.  09/07/2020, 12:45 PM

## 2020-09-07 NOTE — Progress Notes (Signed)
Orthostatic vital signs completed by NT Lying and sitting collected  Pt unable to stand for 1 minute  Pt reports feelings of dizziness, nausea/vomiting  PRN zofran IV given  MD aware

## 2020-09-07 NOTE — Care Management (Signed)
Attempted to discuss potential needs and living arrangements with patient. Patient states her mother Tamara [name] is primary contact. There is not a contact listed in system for [place]. Attempted to call patient's home number to inquire. However, the person that answered the phone said writer had the wrong number. Could not complete conversation with patient as she was drifting to sleep.  MD and patient's nurse made aware of writer's attempt to elicit information. Writer informed that patient's mother is supposed to come to the hospital later on today.    Marthenia Rolling, MSN, RN,BSN Inpatient [facility] Case Manager [phone]

## 2020-09-07 NOTE — Progress Notes (Signed)
PT Cancellation Note  Patient Details Name: Tamara [name] MRN: [id number] DOB: 06-Nov-1976   Cancelled Treatment:    Reason Eval/Treat Not Completed: Patient declined, no reason specified   Will follow up later today as time allows;  Otherwise, will follow up for PT tomorrow;   Thank you,  Roney Marion, PT  Acute Rehabilitation Services Pager [phone] Office [phone]     Colletta Maryland 09/07/2020, 1:09 PM

## 2020-09-07 NOTE — Progress Notes (Signed)
Pt CBG 65 at 0706 Pt did not eat her breakfast tray Provided 8oz of juice, pt able to drink it  Rechecked CBG, 66 Pt states she does not want anything else to drink at this time  1/2 dextrose 50% given  Rechecked CBG, 99 MD aware Will continue to monitor

## 2020-09-08 DIAGNOSIS — K858 Other acute pancreatitis without necrosis or infection: Secondary | ICD-10-CM

## 2020-09-08 LAB — CBC
HCT: 26.8 % — ABNORMAL LOW (ref 36.0–46.0)
Hemoglobin: 8.8 g/dL — ABNORMAL LOW (ref 12.0–15.0)
MCH: 28.9 pg (ref 26.0–34.0)
MCHC: 32.8 g/dL (ref 30.0–36.0)
MCV: 88.2 fL (ref 80.0–100.0)
Platelets: 414 10*3/uL — ABNORMAL HIGH (ref 150–400)
RBC: 3.04 MIL/uL — ABNORMAL LOW (ref 3.87–5.11)
RDW: 18.3 % — ABNORMAL HIGH (ref 11.5–15.5)
WBC: 11.5 10*3/uL — ABNORMAL HIGH (ref 4.0–10.5)
nRBC: 0.3 % — ABNORMAL HIGH (ref 0.0–0.2)

## 2020-09-08 LAB — BASIC METABOLIC PANEL
Anion gap: 13 (ref 5–15)
BUN: 28 mg/dL — ABNORMAL HIGH (ref 6–20)
CO2: 23 mmol/L (ref 22–32)
Calcium: 9 mg/dL (ref 8.9–10.3)
Chloride: 104 mmol/L (ref 98–111)
Creatinine, Ser: 7.77 mg/dL — ABNORMAL HIGH (ref 0.44–1.00)
GFR calc Af Amer: 7 mL/min — ABNORMAL LOW (ref >60)
GFR calc non Af Amer: 6 mL/min — ABNORMAL LOW (ref >60)
Glucose, Bld: 88 mg/dL (ref 70–99)
Potassium: 3.1 mmol/L — ABNORMAL LOW (ref 3.5–5.1)
Sodium: 140 mmol/L (ref 135–145)

## 2020-09-08 LAB — GLUCOSE, CAPILLARY: Glucose-Capillary: 87 mg/dL (ref 70–99)

## 2020-09-08 MED ORDER — POTASSIUM CHLORIDE CRYS ER 10 MEQ PO TBCR
10.0000 meq | EXTENDED_RELEASE_TABLET | Freq: Once | ORAL | Status: AC
  Administered 2020-09-08: 10 meq via ORAL
  Filled 2020-09-08: qty 1

## 2020-09-08 MED ORDER — CHLORHEXIDINE GLUCONATE CLOTH 2 % EX PADS
6.0000 | MEDICATED_PAD | Freq: Every day | CUTANEOUS | Status: DC
  Administered 2020-09-09 – 2020-09-10 (×2): 6 via TOPICAL

## 2020-09-08 MED ORDER — DARBEPOETIN ALFA 60 MCG/0.3ML IJ SOSY
60.0000 ug | PREFILLED_SYRINGE | INTRAMUSCULAR | Status: DC
  Administered 2020-09-09: 60 ug via SUBCUTANEOUS
  Filled 2020-09-08 (×2): qty 0.3

## 2020-09-08 MED ORDER — METOPROLOL TARTRATE 25 MG PO TABS
25.0000 mg | ORAL_TABLET | Freq: Two times a day (BID) | ORAL | Status: DC
  Administered 2020-09-08 – 2020-09-10 (×5): 25 mg via ORAL
  Filled 2020-09-08 (×5): qty 1

## 2020-09-08 NOTE — Progress Notes (Signed)
Renal Navigator spoke with CSW/I. [place] about inability to reach family. Navigator contacted OP HD clinic to ask for contact information.  Mother: Ndeye Tenorio is listed at the clinic as [phone]. Mother's caregiver (who also used to take care of patient) is listed as a contact for patient at the clinic: Precious Haws [phone]. Clinic suggests calling Bonnita Nasuti may be more productive than calling patient's mother.  Information passed along to CSW.  Alphonzo Cruise, [place] Renal Navigator [phone]

## 2020-09-08 NOTE — Care Management (Signed)
Called numbers listed [facility] [phone], [place] aide [phone], received messages, call cannot be completed as dialed.   Bedside nurse will call [facility] team if mom visits or calls again.  Magdalen Spatz RN

## 2020-09-08 NOTE — Care Management (Addendum)
NCM spoke with patient's mother Tamara [name] [phone]. Patient and Tamara [name] live together and Tamara [name] states she can provide 24/7 assistance. Called Tamara [name].   NCM spoke with Tamara [name] she works with [employer]. Tamara [name] cares for both 3 hours a day on Monday, Tuesday and Wednesday's, and 2 hours a day on Thursday , Friday, Saturday, Sundays.   Patient's social services Case Manager is Tamara [name] [phone]. Spoke to Tamara [name]. Patient makes her own decisions. Mr Tamara [name] has contacted PCP [name] [phone] regarding increasing personnel care hours. Per Mr Tamara [name] patient has fallen at home.    Spoke to patient at bedside. Explained PT recommendations. Discussed HHPT with 24/7 supervision, vs SNF for short term rehab. Expressed Tamara [name]'s concern regarding she had fallen at home. Also explained SNF would only be short term  ( few days). Patient stated "I am going home with my mother".    Provided choice , no preference Tamara [name] with [name] accepted referral.   Tamara Spatz RN

## 2020-09-08 NOTE — Progress Notes (Signed)
Physical Therapy Treatment Patient Details Name: Tamara [name] MRN: [id number] DOB: 1976/10/07 Today's Date: 09/08/2020    History of Present Illness The pt is a 44 yo female presenting after a syncopal episode after prolonged nausea/vomiting that resulted in her missing HD. PMH includes: developmental delay, HTN, COVID with ARDS in Feb 2021, ESRD on HD, depression, and anxiety.    PT Comments    The pt is making good progress with therapy this morning, and was eager to progress ambulation out of her room. However, after 1-1.5 min of standing the pt reports dizziness and her BP was 59/46 (down from 120/79 when sitting EOB). The pt reports resolution of all sx and return of BP to WNL immediately upon return to sitting. The pt will continue to benefit from skilled PT to progress functional mobility, activity tolerance, and dynamic stability to improve independence and safety with mobility prior to return home.   VITALS:  - supine - BP: 128/75 (90); HR: 129bpm - sitting EOB - BP: 120/79 (91); HR: 120bpm - standing - BP: 59/46; HR: 101bpm (pt reports onset of sx after 1-1.5 min standing) - sitting EOB - 113/66; HR: 156bpm (sx resolved)    Follow Up Recommendations  Home health PT;Supervision/Assistance - 24 hour (pt reported having a nurse come to help her and her mom every day until 12)     Equipment Recommendations  None recommended by PT    Recommendations for Other Services       Precautions / Restrictions Precautions Precautions: Fall Precaution Comments: orthostatic in standing Restrictions Weight Bearing Restrictions: No    Mobility  Bed Mobility Overal bed mobility: Needs Assistance Bed Mobility: Supine to Sit;Sit to Supine     Supine to sit: Min guard Sit to supine: Min guard   General bed mobility comments: no physical assist given, but use of bed rails and extra time, minG for line management and safety, cues for adjustments sitting EOB  Transfers Overall  transfer level: Needs assistance Equipment used: 1 person hand held assist Transfers: Sit to/from [facility] Sit to Stand: Min assist Stand pivot transfers: Min assist       General transfer comment: minA through HHA to BUE to stand from bed (completed x3 in session), minA to steady while taking small steps to [facility] or couch in room. pt able to tolerate standing and short amb for no more than 1.5 min  Ambulation/Gait Ambulation/Gait assistance: Min assist Gait Distance (Feet): 3 Feet Assistive device: 1 person hand held assist Gait Pattern/deviations: Step-to pattern;Shuffle;Trunk flexed   Gait velocity interpretation: <1.31 ft/sec, indicative of household ambulator General Gait Details: BUE HHA to steady, able to take short lateral steps without LOB or onset of sx as long as pt sits aftter 1 min       Balance Overall balance assessment: Mild deficits observed, not formally tested                                          Cognition Arousal/Alertness: Awake/alert Behavior During Therapy: Flat affect Overall Cognitive Status: No family/caregiver present to determine baseline cognitive functioning                                 General Comments: no caregiver to determine baseline cog, per chart pt with history of "developmental delay" able  to respond to yes/no questions and simple open-ended questions when given extra processing time. able to follow commands with extra time      Exercises      General Comments General comments (skin integrity, edema, etc.): tachy through entire session (133 bpm in bed prior to mobility, high of 145bpm). Orthostatic after prolonged standing > 81min      Pertinent Vitals/Pain Pain Assessment: No/denies pain           PT Goals (current goals can now be found in the care plan section) Acute Rehab PT Goals Patient Stated Goal: to go home PT Goal Formulation: With patient Time For Goal  Achievement: 09/20/20 Potential to Achieve Goals: Good Progress towards PT goals: Progressing toward goals    Frequency    Min 3X/week      PT Plan Current plan remains appropriate       AM-PAC PT "6 Clicks" Mobility   Outcome Measure  Help needed turning from your back to your side while in a flat bed without using bedrails?: None Help needed moving from lying on your back to sitting on the side of a flat bed without using bedrails?: A Little Help needed moving to and from a bed to a chair (including a wheelchair)?: A Little Help needed standing up from a chair using your arms (e.g., wheelchair or bedside chair)?: A Little Help needed to walk in hospital room?: A Lot Help needed climbing 3-5 steps with a railing? : A Lot 6 Click Score: 17    End of Session Equipment Utilized During Treatment: Gait belt Activity Tolerance: Patient limited by fatigue;Other (comment) (orthostatic hypotension) Patient left: in chair;with call bell/phone within reach;with chair alarm set Nurse Communication: Mobility status (orthostatic with prolonged standing) PT Visit Diagnosis: Muscle weakness (generalized) (M62.81);Difficulty in walking, not elsewhere classified (R26.2)     Time: 2500-3704 PT Time Calculation (min) (ACUTE ONLY): 33 min  Charges:  $Gait Training: 8-22 mins $Therapeutic Activity: 8-22 mins                     Karma Ganja, PT, DPT   Acute Rehabilitation Department Pager #: [phone]   Otho Bellows 09/08/2020, 11:31 AM

## 2020-09-08 NOTE — Social Work (Signed)
Aware we have had difficulty getting in touch with family due to phone numbers not working. As pt is outpatient HD pt CSW inquired to [place], [place] and renal navigator, if dialysis center would have a contact number. If we remain unable to get in touch with family we will sent wellness check to home.   Westley Hummer, MSW, [place] Work

## 2020-09-08 NOTE — Consult Note (Signed)
[facility] Renal Consultation Note  Requesting MD: Rudean Curt, DO Indication for Consultation: End-stage renal disease on hemodialysis  Chief complaint: Abdominal pain, nausea and vomiting  HPI:  Tamara [name] is a 44 y.o. female with a history of end-stage renal disease on hemodialysis, developmental delay, hypertension, and depression with anxiety who presented to the hospital on 9/10 after a syncopal episode.  She had also reported abdominal pain, nausea and vomiting.  She had reported missing HD the day prior to presentation secondary to same.  She is being treated for acute pancreatitis.  Nephrology is consulted for assistance with management.  She is a limited historian and history is obtained via chart review.  Per prior charting she is at [facility] (charting - 04/2020).   She asks me to leave because she states she would like to rest.  Spoke with HD unit as below.  Creat  Date/Time Value Ref Range Status  02/08/2017 11:29 AM 0.99 0.50 - 1.10 mg/dL Final   Creatinine, Ser  Date/Time Value Ref Range Status  09/08/2020 03:05 AM 7.77 (H) 0.44 - 1.00 mg/dL Final  09/07/2020 08:58 AM 7.72 (H) 0.44 - 1.00 mg/dL Final  09/06/2020 05:02 AM 7.24 (H) 0.44 - 1.00 mg/dL Final  09/05/2020 12:27 PM 6.97 (H) 0.44 - 1.00 mg/dL Final  05/29/2020 07:23 PM 3.29 (H) 0.44 - 1.00 mg/dL Final  05/29/2014 11:45 AM 0.83 0.50 - 1.10 mg/dL Final  10/22/2010 01:47 PM 0.95 0.40 - 1.20 mg/dL Final  01/14/2010 08:10 PM 0.96 0.40 - 1.20 mg/dL Final  11/14/2008 10:37 AM 1.00  Final  09/13/2008 12:54 PM 1.18  Final  05/21/2008 09:38 PM 1.03  Final  02/03/2008 10:35 PM 0.99  Final     PMHx:   Past Medical History:  Diagnosis Date  . Abnormal uterine bleeding (AUB) 01/26/2016  . Anxiety and depression    mentally slow  . Chronic abdominal pain   . Depression   . Hematuria 01/26/2016  . Hypertension   . Leg pain   . Obesity   . Ovarian cyst 02/10/2016  . PUD (peptic ulcer disease)    . Reflux   . S/P colonoscopy 03/15/11   friable anal canal  . Sebaceous cyst of labia 01/26/2016  . Yeast vaginitis 09/19/2013    Past Surgical History:  Procedure Laterality Date  . TOOTH EXTRACTION N/A 06/03/2014   Procedure: EXTRACTION WISDOM TEETH;  Surgeon: Gae Bon, DDS;  Location: [facility];  Service: Oral Surgery;  Laterality: N/A;  . TUBAL LIGATION      Family Hx:  Family History  Problem Relation Age of Onset  . Colon polyps Father   . Hypertension Father   . Diabetes Father   . Seizures Mother   . Hypertension Mother   . Diabetes Mother   . Diabetes Paternal Grandmother   . Colon cancer Neg Hx     Social History:  reports that she has never smoked. She has never used smokeless tobacco. She reports that she does not drink alcohol and does not use drugs.  Allergies: No Known Allergies  Medications: Prior to Admission medications   Medication Sig Start Date End Date Taking? Authorizing Provider  acetaminophen (TYLENOL) 325 MG tablet Take 650 mg by mouth 4 (four) times daily as needed for moderate pain.    Yes [provider]  allopurinol (ZYLOPRIM) 300 MG tablet Take 300 mg by mouth daily.  09/05/13  Yes [provider]  ALPRAZolam Duanne Moron) 0.5 MG tablet Take  0.5 mg by mouth 3 (three) times daily as needed. Anxiety   Yes [provider]  amLODipine-benazepril (LOTREL) 10-40 MG capsule Take 1 capsule by mouth daily.   Yes [provider]  cetirizine (ZYRTEC) 10 MG tablet Take 10 mg by mouth daily.   Yes [provider]  cloNIDine (CATAPRES) 0.2 MG tablet Take 0.2 mg by mouth 3 (three) times daily.    Yes [provider]  COLCRYS 0.6 MG tablet Take 0.6 mg by mouth 2 (two) times daily.  09/15/13  Yes [provider]  DULoxetine (CYMBALTA) 60 MG capsule Take 60 mg by mouth at bedtime.     Yes [provider]  furosemide (LASIX) 40 MG tablet Take 40 mg by mouth daily.  09/15/13  Yes [provider]  hydrochlorothiazide (MICROZIDE) 12.5 MG capsule Take 12.5 mg by mouth daily.   Yes [provider]  HYDROcodone-acetaminophen (NORCO/VICODIN) 5-325 MG per tablet Take 1 tablet by mouth 4 (four) times daily.  12/13/14  Yes [provider]  lactulose (CHRONULAC) 10 GM/15ML solution Take 20 g by mouth at bedtime.   Yes [provider]  medroxyPROGESTERone (PROVERA) 10 MG tablet TAKE 1 TABLET ONCE DAILY FOR 2 WEEKS. REPEAT EVERY 3 MONTHS Patient taking differently: Take 10 mg by mouth See admin instructions. Take 1 tablet for 2 weeks, repeat every 3 months 12/23/19  Yes [name], MD  metoprolol (LOPRESSOR) 100 MG tablet Take 100 mg by mouth 2 (two) times daily.  11/29/14  Yes [provider]  mirtazapine (REMERON) 15 MG tablet Take 15 mg by mouth at bedtime. 08/27/20  Yes [provider]  omeprazole (PRILOSEC) 20 MG capsule Take 20 mg by mouth daily.     Yes [provider]    I have reviewed the patient's current medications.  Labs:  BMP Latest Ref Rng & Units 09/08/2020 09/07/2020 09/06/2020  Glucose 70 - 99 mg/dL 88 91 81  BUN 6 - 20 mg/dL 28(H) 27(H) 25(H)  Creatinine 0.44 - 1.00 mg/dL 7.77(H) 7.72(H) 7.24(H)  Sodium 135 - 145 mmol/L 140 139 141  Potassium 3.5 - 5.1 mmol/L 3.1(L) 3.0(L) 2.9(L)  Chloride 98 - 111 mmol/L 104 102 101  CO2 22 - 32 mmol/L 23 23 25   Calcium 8.9 - 10.3 mg/dL 9.0 9.3 8.7(L)    Urinalysis    Component Value Date/Time   COLORURINE AMBER (A) 09/05/2020 1755   APPEARANCEUR CLOUDY (A) 09/05/2020 1755   APPEARANCEUR Clear 09/16/2016 1640   LABSPEC 1.016 09/05/2020 1755   PHURINE 5.0 09/05/2020 [place] 09/05/2020 1755   HGBUR NEGATIVE 09/05/2020 1755   BILIRUBINUR NEGATIVE 09/05/2020 1755   BILIRUBINUR Negative 09/16/2016 1640   KETONESUR NEGATIVE 09/05/2020 1755   PROTEINUR 30 (A) 09/05/2020 1755   UROBILINOGEN 0.2 01/14/2010 1638   NITRITE NEGATIVE 09/05/2020 1755   LEUKOCYTESUR  LARGE (A) 09/05/2020 1755     ROS:  Unable to obtain secondary to patient factors.  She states that she wants to be left alone.  Physical Exam: Vitals:   09/08/20 0939 09/08/20 1231  BP: 133/80 129/90  Pulse: (!) 131 (!) 104  Resp: 16 16  Temp: 97.9 F (36.6 C) 97.9 F (36.6 C)  SpO2: 100% 99%     General: Adult female in bed in no acute distress at rest HEENT: Normocephalic atraumatic Eyes: Extraocular movements intact sclera Neck: Supple trachea midline Heart : S1-S2 tachycardic no rub Lungs: Clear to auscultation unlabored on room air Abdomen:  Soft distended with obese habitus no guarding for me Extremities: No pitting edema appreciated no cyanosis or clubbing Skin: No rash on extremities Neuro: Patient is not oriented to year and is able to tell me her name and the location of the hospital spoke with nurse and this is her baseline Psych developmental delay as noted she rolls over and asks me to leave visit and is in no acute distress Access RIJ tunneled catheter   Outpatient HD orders:  [facility] EDW 85.5 kg 4 hours 3K/2.5 calcium bath  BF 400/ DF 600 Meds: epggen 2000 units per tx; venofer 50 mg weekly. No calcitriol or hectorol  Tunneled catheter  Assessment/Plan:  # ESRD  - HD per TTS schedule no emergent indication for dialysis today  # Acute pancreatitis  - Pain management and supportive care per primary team would defer fluids.  Would saline lock IV.  NS not ordered but running at 50 cc/hr   # HTN  - Controlled on current regimen   # Hypokalemia  - ok for liberalized diet; she is on 3K bath outpatient  - potassium 10 meq PO once now  # Anemia CKD  - Provide aranesp 60 mcg weekly to start 9/14  # secondary hyperpara - per outpatient unit not on calcitriol or hectorol.  Check phos in AM.   Claudia Desanctis 09/08/2020, 2:20 PM

## 2020-09-08 NOTE — Progress Notes (Signed)
   09/08/20 0636  Assess: MEWS Score  Temp 98 F (36.7 C)  BP 138/84  Pulse Rate (!) 119  Resp 17  O2 Device Room Air  Assess: MEWS Score  MEWS Temp 0  MEWS Systolic 0  MEWS Pulse 2  MEWS RR 0  MEWS LOC 0  MEWS Score 2  MEWS Score Color Yellow  Assess: if the MEWS score is Yellow or Red  Were vital signs taken at a resting state? Yes  Focused Assessment No change from prior assessment  Early Detection of Sepsis Score *See Row Information* Medium  MEWS guidelines implemented *See Row Information* No, previously yellow, continue vital signs every 4 hours  Treat  MEWS Interventions Other (Comment) (vital signs q2)

## 2020-09-08 NOTE — Progress Notes (Signed)
Progress Note    Tamara [name]  QPR:[id number] DOB: 03-17-1976  DOA: 09/05/2020 PCP: Sinda Du, MD    Brief Narrative:     Medical records reviewed and are as summarized below:  Tamara [name] is an 44 y.o. female with medical history significant for developmental delay, history of hypertension, Covid with ARDS in February 2021, ESRD on hemodialysis, and depression with anxiety, now presenting to the emergency department after syncopal episode.  Patient reports upper abdominal pain for at least a week, has had nausea with frequent episodes of vomiting, missed dialysis yesterday due to this, and was at the vascular surgery clinic today for an ultrasound when she had a syncopal episode while seated.  Patient reports that she has been lightheaded when sitting up or standing but did not notice any chest pain or palpitations.  She denies shortness of breath, cough, hemoptysis, or leg swelling or tenderness.  She reports a loss of appetite and frequent vomiting.  She denies diarrhea or fevers.  She denies dysuria, hematuria, or flank pain.  Assessment/Plan:   Principal Problem:   Recurrent syncope Active Problems:   Acute on chronic pancreatitis (HCC)   ESRD (end stage renal disease) (HCC)   Orthostatic hypotension   Hypokalemia   Asymptomatic bacteriuria   Acute pancreatitis   Syncope; orthostatic hypotension  - Presents from vascular surgery clinic after a syncopal episode that occurred while seated  - She remains hypotensive on standing after 750 cc saline in ED  -repeat orthostatics alarming with 3 min BP of 39/28 -- did not mention if patient was symptomatic-- repeat orthos improved but still positive -TED hose when up -TSH normal -cortisol elevated but was not drawn until 9 AM-- ? significance -recent COVID infection in May 2021: ? Post-acute COVID causing her OH   Acute  pancreatitis  -could not find history of prior episodes in chart -CT scan suggestive of  autoimmune- check IGG4 levels - no fluid-collection, gall stones, or biliary dilatation  - LFTs are normal  - continue liquid diet ? Medication induced as on diuretics  ESRD  - Normally dialyzed TTS but reports missing 9/9 session due to abdominal pain and N/V  - She appears hypovolemic on admission and there is no hyperkalemia, uremia, severe HTN, or acidosis to prompt urgent HD  - nephology notified of admission -? Need for diuretic  Hypokalemia /hypomagnesemia - Replace cautiously in ESRD pt  History of HTN  - She is on 6 antihypertensives at home -hold meds for now-- as she is orthostatic may have to allow for permissive supine HTN -will resume BB at a lower dose as she may have a component of rebound tachycardia  Depression, anxiety  - Continue home regimen   obesity Body mass index is 31.15 kg/m.   Family Communication/Anticipated D/C date and plan/Code Status   DVT prophylaxis: heparin Code Status: Full Code.  Disposition Plan: Status is: inpt  Remain inpt as not taking in PO and still with OH  Dispo: The patient is from: Home              Anticipated d/c is to: Home              Anticipated d/c date is: 2 days              Patient currently is not medically stable to d/c.  Medical Consultants:    Renal (HD needed)  Subjective:   Focused on going home despite eating 0% of  her meals  Objective:    Vitals:   09/08/20 0006 09/08/20 0636 09/08/20 0803 09/08/20 0939  BP: 135/76 138/84 (!) 143/89 133/80  Pulse: (!) 105 (!) 119 (!) 118 (!) 131  Resp: 16 17 16 16   Temp: 98.2 F (36.8 C) 98 F (36.7 C) 97.8 F (36.6 C) 97.9 F (36.6 C)  TempSrc: Oral Oral Oral Oral  SpO2: 99%  96% 100%  Weight:      Height:        Intake/Output Summary (Last 24 hours) at 09/08/2020 1022 Last data filed at 09/08/2020 0200 Gross per 24 hour  Intake 528.35 ml  Output --  Net 528.35 ml   Filed Weights   09/06/20 1141 09/06/20 2042 09/07/20 0920  Weight: 83.4  kg 82.8 kg 84.9 kg    Exam:   General: Appearance:    Obese female appears uncomfortable     Lungs:      respirations unlabored  Heart:    Tachycardic. Normal rhythm. No murmurs, rubs, or gallops.   MS:     Neurologic:   Awake, poor eye contact, poor insight into disease process    Data Reviewed:   I have personally reviewed following labs and imaging studies:  Labs: Labs show the following:   Basic Metabolic Panel: Recent Labs  Lab 09/05/20 1227 09/05/20 1227 09/06/20 0502 09/06/20 0502 09/07/20 0858 09/08/20 0305  NA 140  --  141  --  139 140  K 3.1*   < > 2.9*   < > 3.0* 3.1*  CL 99  --  101  --  102 104  CO2 26  --  25  --  23 23  GLUCOSE 92  --  81  --  91 88  BUN 22*  --  25*  --  27* 28*  CREATININE 6.97*  --  7.24*  --  7.72* 7.77*  CALCIUM 9.1  --  8.7*  --  9.3 9.0  MG  --   --  1.5*  --   --   --    < > = values in this interval not displayed.   GFR Estimated Creatinine Clearance: 10.2 mL/min (A) (by C-G formula based on SCr of 7.77 mg/dL (H)). Liver Function Tests: Recent Labs  Lab 09/05/20 1227  AST 12*  ALT 11  ALKPHOS 75  BILITOT 1.1  PROT 5.6*  ALBUMIN 3.1*   Recent Labs  Lab 09/05/20 1227  LIPASE 53*   No results for input(s): AMMONIA in the last 168 hours. Coagulation profile No results for input(s): INR, PROTIME in the last 168 hours.  CBC: Recent Labs  Lab 09/05/20 1227 09/06/20 0502 09/07/20 0858 09/08/20 0305  WBC 13.0* 12.5* 14.3* 11.5*  HGB 10.5* 9.2* 10.2* 8.8*  HCT 33.3* 28.5* 30.6* 26.8*  MCV 91.0 89.3 89.5 88.2  PLT 486* 400 487* 414*   Cardiac Enzymes: No results for input(s): CKTOTAL, CKMB, CKMBINDEX, TROPONINI in the last 168 hours. BNP (last 3 results) No results for input(s): PROBNP in the last 8760 hours. CBG: Recent Labs  Lab 09/06/20 0617 09/07/20 0706 09/07/20 0804 09/07/20 0827 09/08/20 0642  GLUCAP 88 65* 66* 99 87   D-Dimer: No results for input(s): DDIMER in the last 72 hours. Hgb  A1c: No results for input(s): HGBA1C in the last 72 hours. Lipid Profile: Recent Labs    09/06/20 0502  TRIG 149   Thyroid function studies: Recent Labs    09/07/20 0858  TSH 2.889   Anemia  work up: No results for input(s): VITAMINB12, FOLATE, FERRITIN, TIBC, IRON, RETICCTPCT in the last 72 hours. Sepsis Labs: Recent Labs  Lab 09/05/20 1227 09/06/20 0502 09/07/20 0858 09/08/20 0305  WBC 13.0* 12.5* 14.3* 11.5*    Microbiology Recent Results (from the past 240 hour(s))  SARS Coronavirus 2 by RT PCR (hospital order, performed in [facility] hospital lab) Nasopharyngeal Nasopharyngeal Swab     Status: None   Collection Time: 09/05/20  6:05 PM   Specimen: Nasopharyngeal Swab  Result Value Ref Range Status   SARS Coronavirus 2 NEGATIVE NEGATIVE Final    Comment: (NOTE) SARS-CoV-2 target nucleic acids are NOT DETECTED.  The SARS-CoV-2 RNA is generally detectable in upper and lower respiratory specimens during the acute phase of infection. The lowest concentration of SARS-CoV-2 viral copies this assay can detect is 250 copies / mL. A negative result does not preclude SARS-CoV-2 infection and should not be used as the sole basis for treatment or other patient management decisions.  A negative result may occur with improper specimen collection / handling, submission of specimen other than nasopharyngeal swab, presence of viral mutation(s) within the areas targeted by this assay, and inadequate number of viral copies (<250 copies / mL). A negative result must be combined with clinical observations, patient history, and epidemiological information.  Fact Sheet for Patients:   StrictlyIdeas.no  Fact Sheet for Healthcare Providers: BankingDealers.co.za  This test is not yet approved or  cleared by the [place] FDA and has been authorized for detection and/or diagnosis of SARS-CoV-2 by FDA under an Emergency Use  Authorization (EUA).  This EUA will remain in effect (meaning this test can be used) for the duration of the COVID-19 declaration under Section 564(b)(1) of the Act, 21 U.S.C. section 360bbb-3(b)(1), unless the authorization is terminated or revoked sooner.  Performed at [facility], [place] [address] [place], [place] [address]   Urine culture     Status: Abnormal   Collection Time: 09/05/20  6:55 PM   Specimen: Urine, Random  Result Value Ref Range Status   Specimen Description URINE, RANDOM  Final   Special Requests   Final    NONE Performed at [facility], [place] [address], [place], [place] [address]    Culture MULTIPLE SPECIES PRESENT, SUGGEST RECOLLECTION (A)  Final   Report Status 09/07/2020 FINAL  Final    Procedures and diagnostic studies:  No results found.  Medications:   . allopurinol  300 mg Oral Daily  . Chlorhexidine Gluconate Cloth  6 each Topical Daily  . DULoxetine  60 mg Oral QHS  . heparin  5,000 Units Subcutaneous Q8H  . metoprolol tartrate  25 mg Oral BID  . mirtazapine  15 mg Oral QHS  . pantoprazole  40 mg Oral Daily  . sodium chloride flush  3 mL Intravenous Q12H   Continuous Infusions:    LOS: 2 days   Geradine Girt  [facility]   How to contact the [facility] Attending or Consulting provider [place] or covering provider during after hours [place], for this patient?  1. Check the care team in [facility] and look for a) attending/consulting [facility] provider listed and b) the [facility] team listed 2. Log into [email or web address] and use [place]'s universal password to access. If you do not have the password, please contact the hospital operator. 3. Locate the [facility] provider you are looking for under [facility] and page to a number that you can be directly  reached. 4. If you still have difficulty reaching the provider, please page the [facility] (Director on Call) for the Hospitalists listed on amion for assistance.  09/08/2020, 10:22 AM

## 2020-09-08 NOTE — TOC Progression Note (Signed)
Transition of Care [facility]) - Progression Note    Patient Details  Name: Tamara [name] MRN: [id number] Date of Birth: 22-Jan-1976  [facility]) CM/SW Contact  Electra Paladino, Edson Snowball, RN Phone Number: 09/08/2020, 4:51 PM  Clinical Narrative:       Expected Discharge Plan: [place] Barriers to Discharge: Continued Medical Work up  Expected Discharge Plan and Services Expected Discharge Plan: [place] Choice: [place] arrangements for the past 2 months: Single Family Home                 DME Arranged: N/A DME Agency: NA       HH Arranged: PT HH Agency: [place] Date [place]: 09/08/20 Time [place]: [id number] Representative spoke with at [place]: [place] ([place]) Interventions    Readmission Risk Interventions No flowsheet data found.

## 2020-09-09 LAB — CBC
HCT: 26.5 % — ABNORMAL LOW (ref 36.0–46.0)
Hemoglobin: 8.6 g/dL — ABNORMAL LOW (ref 12.0–15.0)
MCH: 28.9 pg (ref 26.0–34.0)
MCHC: 32.5 g/dL (ref 30.0–36.0)
MCV: 88.9 fL (ref 80.0–100.0)
Platelets: 366 10*3/uL (ref 150–400)
RBC: 2.98 MIL/uL — ABNORMAL LOW (ref 3.87–5.11)
RDW: 18.6 % — ABNORMAL HIGH (ref 11.5–15.5)
WBC: 11.8 10*3/uL — ABNORMAL HIGH (ref 4.0–10.5)
nRBC: 0 % (ref 0.0–0.2)

## 2020-09-09 LAB — RENAL FUNCTION PANEL
Albumin: 2.7 g/dL — ABNORMAL LOW (ref 3.5–5.0)
Anion gap: 14 (ref 5–15)
BUN: 31 mg/dL — ABNORMAL HIGH (ref 6–20)
CO2: 23 mmol/L (ref 22–32)
Calcium: 8.7 mg/dL — ABNORMAL LOW (ref 8.9–10.3)
Chloride: 103 mmol/L (ref 98–111)
Creatinine, Ser: 7.76 mg/dL — ABNORMAL HIGH (ref 0.44–1.00)
GFR calc Af Amer: 7 mL/min — ABNORMAL LOW (ref >60)
GFR calc non Af Amer: 6 mL/min — ABNORMAL LOW (ref >60)
Glucose, Bld: 69 mg/dL — ABNORMAL LOW (ref 70–99)
Phosphorus: 3.6 mg/dL (ref 2.5–4.6)
Potassium: 3.5 mmol/L (ref 3.5–5.1)
Sodium: 140 mmol/L (ref 135–145)

## 2020-09-09 LAB — IGG 4: IgG, Subclass 4: 1 mg/dL — ABNORMAL LOW (ref 2–96)

## 2020-09-09 LAB — GLUCOSE, CAPILLARY
Glucose-Capillary: 78 mg/dL (ref 70–99)
Glucose-Capillary: 83 mg/dL (ref 70–99)
Glucose-Capillary: 84 mg/dL (ref 70–99)

## 2020-09-09 MED ORDER — SODIUM CHLORIDE 0.9 % IV SOLN: 100.0000 mL | INTRAVENOUS | Status: DC | PRN

## 2020-09-09 MED ORDER — HEPARIN SODIUM (PORCINE) 1000 UNIT/ML IJ SOLN
INTRAMUSCULAR | Status: AC
  Filled 2020-09-09: qty 4

## 2020-09-09 MED ORDER — ALTEPLASE 2 MG IJ SOLR: 2.0000 mg | Freq: Once | INTRAMUSCULAR | Status: DC | PRN

## 2020-09-09 MED ORDER — HEPARIN SODIUM (PORCINE) 1000 UNIT/ML DIALYSIS
1000.0000 [IU] | INTRAMUSCULAR | Status: DC | PRN
  Administered 2020-09-09: 1000 [IU] via INTRAVENOUS_CENTRAL

## 2020-09-09 MED ORDER — LIDOCAINE HCL (PF) 1 % IJ SOLN: 5.0000 mL | INTRAMUSCULAR | Status: DC | PRN

## 2020-09-09 MED ORDER — PENTAFLUOROPROP-TETRAFLUOROETH EX AERO: 1.0000 "application " | INHALATION_SPRAY | CUTANEOUS | Status: DC | PRN

## 2020-09-09 MED ORDER — LIDOCAINE-PRILOCAINE 2.5-2.5 % EX CREA: 1.0000 "application " | TOPICAL_CREAM | CUTANEOUS | Status: DC | PRN

## 2020-09-09 NOTE — Progress Notes (Signed)
Progress Note    Tamara [name]  UVO:[id number] DOB: June 02, 1976  DOA: 09/05/2020 PCP: Sinda Du, MD    Brief Narrative:     Medical records reviewed and are as summarized below:  Tamara [name] is an 44 y.o. female with medical history significant for developmental delay, history of hypertension, Covid with ARDS in February 2021, ESRD on hemodialysis, and depression with anxiety, now presenting to the emergency department after syncopal episode.  Patient reports upper abdominal pain for at least a week, has had nausea with frequent episodes of vomiting, missed dialysis yesterday due to this, and was at the vascular surgery clinic today for an ultrasound when she had a syncopal episode while seated.  Patient reports that she has been lightheaded when sitting up or standing but did not notice any chest pain or palpitations.  She denies shortness of breath, cough, hemoptysis, or leg swelling or tenderness.  She reports a loss of appetite and frequent vomiting.  She denies diarrhea or fevers.  She denies dysuria, hematuria, or flank pain.  Assessment/Plan:   Principal Problem:   Recurrent syncope Active Problems:   Acute on chronic pancreatitis (HCC)   ESRD (end stage renal disease) (HCC)   Orthostatic hypotension   Hypokalemia   Asymptomatic bacteriuria   Acute pancreatitis   Syncope; orthostatic hypotension  - Presents from vascular surgery clinic after a syncopal episode that occurred while seated  - She remains hypotensive on standing after 750 cc saline in ED  -repeat orthostatics alarming with 3 min BP of 39/28 -- did not mention if patient was symptomatic-- repeat orthos improved but still positive -TED hose when up -TSH normal -cortisol elevated but was not drawn until 9 AM-- ? significance -recent COVID infection in May 2021: ? Post-acute COVID causing her OH   Acute  pancreatitis  -could not find history of prior episodes in chart -CT scan suggestive of  autoimmune but IGG4 not consistent at 1  - no fluid-collection, gall stones, or biliary dilatation  - LFTs are normal  - advance diet although patient refusing to eat here  ESRD  - Normally dialyzed TTS but reports missing 9/9 session due to abdominal pain and N/V  - She appears hypovolemic on admission and there is no hyperkalemia, uremia, severe HTN, or acidosis to prompt urgent HD  - nephology notified of admission -? Need for diuretic  Hypokalemia /hypomagnesemia - Replace cautiously in ESRD pt  History of HTN  - She is on 6 antihypertensives at home -hold meds for now except BB-- as she is orthostatic may have to allow for permissive supine HTN -will resume BB at a lower dose as she may have a component of rebound tachycardia  Depression, anxiety  - Continue home regimen   obesity Body mass index is 29.86 kg/m.   Family Communication/Anticipated D/C date and plan/Code Status   DVT prophylaxis: heparin Code Status: Full Code.  Disposition Plan: Status is: inpt  Remain inpt as not taking in PO and still with OH  Dispo: The patient is from: Home              Anticipated d/c is to: Home              Anticipated d/c date is: once eating              Patient currently is not medically stable to d/c.  Medical Consultants:    Renal (HD needed)  Subjective:   Patient states she  wants to leave AMA, still refusing to eat, per social work's discussion with patient's social worker: patient can make her own decisions  Objective:    Vitals:   09/09/20 1100 09/09/20 1130 09/09/20 1145 09/09/20 1337  BP: 119/70 119/72 133/74 120/79  Pulse: (!) 109 (!) 111 (!) 105 74  Resp:    18  Temp:   98.8 F (37.1 C) 98.5 F (36.9 C)  TempSrc:   Oral Oral  SpO2:   100% 95%  Weight:   81.4 kg   Height:        Intake/Output Summary (Last 24 hours) at 09/09/2020 1442 Last data filed at 09/09/2020 1145 Gross per 24 hour  Intake --  Output 2016 ml  Net -2016 ml   Filed  Weights   09/07/20 0920 09/09/20 0805 09/09/20 1145  Weight: 84.9 kg 83.4 kg 81.4 kg    Exam:   General: Appearance:     Overweight female in no acute distress     Lungs:     respirations unlabored  Heart:    Normal heart rate. Normal rhythm. No murmurs, rubs, or gallops.   MS:   All extremities are intact.   Neurologic:   Awake and alert    Data Reviewed:   I have personally reviewed following labs and imaging studies:  Labs: Labs show the following:   Basic Metabolic Panel: Recent Labs  Lab 09/05/20 1227 09/05/20 1227 09/06/20 0502 09/06/20 0502 09/07/20 0858 09/07/20 0858 09/08/20 0305 09/09/20 1001  NA 140  --  141  --  139  --  140 140  K 3.1*   < > 2.9*   < > 3.0*   < > 3.1* 3.5  CL 99  --  101  --  102  --  104 103  CO2 26  --  25  --  23  --  23 23  GLUCOSE 92  --  81  --  91  --  88 69*  BUN 22*  --  25*  --  27*  --  28* 31*  CREATININE 6.97*  --  7.24*  --  7.72*  --  7.77* 7.76*  CALCIUM 9.1  --  8.7*  --  9.3  --  9.0 8.7*  MG  --   --  1.5*  --   --   --   --   --   PHOS  --   --   --   --   --   --   --  3.6   < > = values in this interval not displayed.   GFR Estimated Creatinine Clearance: 10 mL/min (A) (by C-G formula based on SCr of 7.76 mg/dL (H)). Liver Function Tests: Recent Labs  Lab 09/05/20 1227 09/09/20 1001  AST 12*  --   ALT 11  --   ALKPHOS 75  --   BILITOT 1.1  --   PROT 5.6*  --   ALBUMIN 3.1* 2.7*   Recent Labs  Lab 09/05/20 1227  LIPASE 53*   No results for input(s): AMMONIA in the last 168 hours. Coagulation profile No results for input(s): INR, PROTIME in the last 168 hours.  CBC: Recent Labs  Lab 09/05/20 1227 09/06/20 0502 09/07/20 0858 09/08/20 0305 09/09/20 1001  WBC 13.0* 12.5* 14.3* 11.5* 11.8*  HGB 10.5* 9.2* 10.2* 8.8* 8.6*  HCT 33.3* 28.5* 30.6* 26.8* 26.5*  MCV 91.0 89.3 89.5 88.2 88.9  PLT 486* 400 487* 414* 366  Cardiac Enzymes: No results for input(s): CKTOTAL, CKMB, CKMBINDEX,  TROPONINI in the last 168 hours. BNP (last 3 results) No results for input(s): PROBNP in the last 8760 hours. CBG: Recent Labs  Lab 09/07/20 0804 09/07/20 0827 09/08/20 0642 09/09/20 0550 09/09/20 1149  GLUCAP 66* 99 87 78 83   D-Dimer: No results for input(s): DDIMER in the last 72 hours. Hgb A1c: No results for input(s): HGBA1C in the last 72 hours. Lipid Profile: No results for input(s): CHOL, HDL, LDLCALC, TRIG, CHOLHDL, LDLDIRECT in the last 72 hours. Thyroid function studies: Recent Labs    09/07/20 0858  TSH 2.889   Anemia work up: No results for input(s): VITAMINB12, FOLATE, FERRITIN, TIBC, IRON, RETICCTPCT in the last 72 hours. Sepsis Labs: Recent Labs  Lab 09/06/20 0502 09/07/20 0858 09/08/20 0305 09/09/20 1001  WBC 12.5* 14.3* 11.5* 11.8*    Microbiology Recent Results (from the past 240 hour(s))  SARS Coronavirus 2 by RT PCR (hospital order, performed in [facility] hospital lab) Nasopharyngeal Nasopharyngeal Swab     Status: None   Collection Time: 09/05/20  6:05 PM   Specimen: Nasopharyngeal Swab  Result Value Ref Range Status   SARS Coronavirus 2 NEGATIVE NEGATIVE Final    Comment: (NOTE) SARS-CoV-2 target nucleic acids are NOT DETECTED.  The SARS-CoV-2 RNA is generally detectable in upper and lower respiratory specimens during the acute phase of infection. The lowest concentration of SARS-CoV-2 viral copies this assay can detect is 250 copies / mL. A negative result does not preclude SARS-CoV-2 infection and should not be used as the sole basis for treatment or other patient management decisions.  A negative result may occur with improper specimen collection / handling, submission of specimen other than nasopharyngeal swab, presence of viral mutation(s) within the areas targeted by this assay, and inadequate number of viral copies (<250 copies / mL). A negative result must be combined with clinical observations, patient history, and  epidemiological information.  Fact Sheet for Patients:   StrictlyIdeas.no  Fact Sheet for Healthcare Providers: BankingDealers.co.za  This test is not yet approved or  cleared by the [place] FDA and has been authorized for detection and/or diagnosis of SARS-CoV-2 by FDA under an Emergency Use Authorization (EUA).  This EUA will remain in effect (meaning this test can be used) for the duration of the COVID-19 declaration under Section 564(b)(1) of the Act, 21 U.S.C. section 360bbb-3(b)(1), unless the authorization is terminated or revoked sooner.  Performed at [facility], [place] [address] [place], [place] [address]   Urine culture     Status: Abnormal   Collection Time: 09/05/20  6:55 PM   Specimen: Urine, Random  Result Value Ref Range Status   Specimen Description URINE, RANDOM  Final   Special Requests   Final    NONE Performed at [facility], [place] [address] [place], [place] [address]    Culture MULTIPLE SPECIES PRESENT, SUGGEST RECOLLECTION (A)  Final   Report Status 09/07/2020 FINAL  Final    Procedures and diagnostic studies:  No results found.  Medications:   . heparin sodium (porcine)      . allopurinol  300 mg Oral Daily  . Chlorhexidine Gluconate Cloth  6 each Topical Daily  . Chlorhexidine Gluconate Cloth  6 each Topical Q0600  . darbepoetin (ARANESP) injection - NON-DIALYSIS  60 mcg Subcutaneous Q Tue-1800  . DULoxetine  60 mg Oral QHS  . heparin  5,000 Units Subcutaneous Q8H  . metoprolol tartrate  25 mg Oral BID  . mirtazapine  15 mg Oral QHS  . pantoprazole  40 mg Oral Daily  . sodium chloride flush  3 mL Intravenous Q12H   Continuous Infusions:    LOS: 3 days   Geradine Girt  [facility]   How to contact the [facility] Attending or Consulting provider [place] or covering provider during after hours [place], for this patient?  1. Check the care team in [facility] and look for a)  attending/consulting TRH provider listed and b) the [facility] team listed 2. Log into [email or web address] and use [place]'s universal password to access. If you do not have the password, please contact the hospital operator. 3. Locate the [facility] provider you are looking for under [facility] and page to a number that you can be directly reached. 4. If you still have difficulty reaching the provider, please page the [facility] (Director on Call) for the Hospitalists listed on amion for assistance.  09/09/2020, 2:42 PM

## 2020-09-09 NOTE — Progress Notes (Signed)
Pt called out stating that she had to use the bathroom, Tracey, NT responded to the call and took the patient to the BR without any problems. When [place], NT was helping the pt return to the bed she called out stating she needed help. Jonelle Sidle, NT and I went into the room and saw the patient laying on the floor I asked Tracey,NT what happened and she stated that when they reached the bed the patient crouched down on her knees own accord and laid back on the floor. NT was trying to get her up but needed help and that's when she called [place]. We helped the patient get back up and into the bed and I asked her was she okay the patient stated " yes she was okay she just had a dizzy spell and just wanted to lay down". Will notify MD

## 2020-09-09 NOTE — Progress Notes (Signed)
Pt stated that she wanted to go home MD spoke with her and told her that she wants her to increase her oral intake and get better prior to DC but pt states she wants to leave. I went over what it means to leave AMA with the patient and she stated she understood, she signed the [facility] paperwork it is in the chart. Pt is trying to find a ride, mother's caregiver left an hour ago and can not pick her up.

## 2020-09-09 NOTE — Progress Notes (Signed)
[facility] Progress Note  Name: Tamara [name] MRN: [id number] DOB: 01-Sep-1976  Subjective:  Seen and examined on dialysis.  Blood pressure 144/80 and HR 93.  Tolerating goal.  States HD is going "fine".  RIJ tunneled catheter  Background on consult:  Tamara [name] is a 44 y.o. female with a history of end-stage renal disease on hemodialysis, developmental delay, hypertension, and depression with anxiety who presented to the hospital on 9/10 after a syncopal episode.  She had also reported abdominal pain, nausea and vomiting.  She had reported missing HD the day prior to presentation secondary to same.  She is being treated for acute pancreatitis.  Nephrology is consulted for assistance with management.  She is a limited historian and history is obtained via chart review.  Per prior charting she is at [place] (charting - 04/2020).   She asks me to leave because she states she would like to rest.  Spoke with HD unit as below.  No intake or output data in the 24 hours ending 09/09/20 0834  Vitals:  Vitals:   09/08/20 1231 09/08/20 1614 09/09/20 0551 09/09/20 0805  BP: 129/90 127/82 136/85 (!) 144/81  Pulse: (!) 104 87 97 96  Resp: 16 16 17 20   Temp: 97.9 F (36.6 C) 98.2 F (36.8 C) 97.8 F (36.6 C) 98.9 F (37.2 C)  TempSrc: Oral Oral Oral Oral  SpO2: 99% 97% 100% 100%  Weight:    83.4 kg  Height:         Physical Exam:  General: Adult female in bed in no acute distress at rest HEENT: Normocephalic atraumatic Eyes: Extraocular movements intact sclera Neck: Supple trachea midline Heart : S1-S2 tachycardic no rub Lungs: Clear to auscultation unlabored on room air Abdomen: Soft distended with obese habitus no guarding for me Extremities: No pitting edema appreciated  Skin: No rash on extremities Access RIJ tunneled catheter   Medications reviewed   Labs:  BMP Latest Ref Rng & Units 09/08/2020 09/07/2020 09/06/2020  Glucose 70 - 99 mg/dL 88 91 81  BUN 6 -  20 mg/dL 28(H) 27(H) 25(H)  Creatinine 0.44 - 1.00 mg/dL 7.77(H) 7.72(H) 7.24(H)  Sodium 135 - 145 mmol/L 140 139 141  Potassium 3.5 - 5.1 mmol/L 3.1(L) 3.0(L) 2.9(L)  Chloride 98 - 111 mmol/L 104 102 101  CO2 22 - 32 mmol/L 23 23 25   Calcium 8.9 - 10.3 mg/dL 9.0 9.3 8.7(L)   Outpatient HD orders:  [facility] TTS EDW 85.5 kg 4 hours 3K/2.5 calcium bath  BF 400/ DF 600 Meds: epogen 2000 units per tx; venofer 50 mg weekly. No calcitriol or hectorol  Tunneled catheter  Assessment/Plan:   # ESRD  - HD per TTS schedule  - Labs were just drawn and not available yet  # Acute pancreatitis  - Pain management and supportive care per primary team would defer fluids.  Would saline lock IV  # HTN  - Controlled on current regimen; would discharge on her current regimen   # Hypokalemia  - ok for liberalized diet; she is on 3K bath outpatient   # Anemia CKD  - Provide aranesp 60 mcg weekly to start 9/14  # secondary hyperpara - per outpatient unit not on calcitriol or hectorol.  Check phos   Disposition per primary team   Claudia Desanctis, MD  09/09/2020 8:34 AM

## 2020-09-09 NOTE — Progress Notes (Signed)
Renal Navigator notes that patient has signed AMA papers and states she is leaving. Navigator faxed pertinent hospitalization records to OP HD clinic/[facility] to provide continuity of care.  Alphonzo Cruise, [place] Renal Navigator [phone]

## 2020-09-10 LAB — BASIC METABOLIC PANEL
Anion gap: 10 (ref 5–15)
BUN: 12 mg/dL (ref 6–20)
CO2: 25 mmol/L (ref 22–32)
Calcium: 8.6 mg/dL — ABNORMAL LOW (ref 8.9–10.3)
Chloride: 102 mmol/L (ref 98–111)
Creatinine, Ser: 3.95 mg/dL — ABNORMAL HIGH (ref 0.44–1.00)
GFR calc Af Amer: 15 mL/min — ABNORMAL LOW (ref >60)
GFR calc non Af Amer: 13 mL/min — ABNORMAL LOW (ref >60)
Glucose, Bld: 80 mg/dL (ref 70–99)
Potassium: 3.7 mmol/L (ref 3.5–5.1)
Sodium: 137 mmol/L (ref 135–145)

## 2020-09-10 LAB — CBC
HCT: 28.1 % — ABNORMAL LOW (ref 36.0–46.0)
Hemoglobin: 9.6 g/dL — ABNORMAL LOW (ref 12.0–15.0)
MCH: 30.1 pg (ref 26.0–34.0)
MCHC: 34.2 g/dL (ref 30.0–36.0)
MCV: 88.1 fL (ref 80.0–100.0)
Platelets: 303 10*3/uL (ref 150–400)
RBC: 3.19 MIL/uL — ABNORMAL LOW (ref 3.87–5.11)
RDW: 17.9 % — ABNORMAL HIGH (ref 11.5–15.5)
WBC: 10.3 10*3/uL (ref 4.0–10.5)
nRBC: 0 % (ref 0.0–0.2)

## 2020-09-10 LAB — GLUCOSE, CAPILLARY: Glucose-Capillary: 65 mg/dL — ABNORMAL LOW (ref 70–99)

## 2020-09-10 NOTE — Progress Notes (Signed)
Discharge Summary faxed to OP HD clinic to provide continuity of care.   Alphonzo Cruise, [place] Renal Navigator [phone]

## 2020-09-10 NOTE — Progress Notes (Signed)
[facility] Progress Note  Name: Tamara [name] MRN: [id number] DOB: 09-13-1976  Subjective:  Last HD on 9/14 with 2 kg UF.  She feels ok - states that she is going home.  She is eating ok.   Review of systems:  Denies n/v  Denies shortness of breath   Background on consult:  Tamara [name] is a 44 y.o. female with a history of end-stage renal disease on hemodialysis, developmental delay, hypertension, and depression with anxiety who presented to the hospital on 9/10 after a syncopal episode.  She had also reported abdominal pain, nausea and vomiting.  She had reported missing HD the day prior to presentation secondary to same.  She is being treated for acute pancreatitis.  Nephrology is consulted for assistance with management.  She is a limited historian and history is obtained via chart review.  Per prior charting she is at [place] (charting - 04/2020).   She asks me to leave because she states she would like to rest.  Spoke with HD unit as below.  No intake or output data in the 24 hours ending 09/10/20 1412  Vitals:  Vitals:   09/09/20 1600 09/09/20 1958 09/10/20 0048 09/10/20 0414  BP: 120/76 133/82 139/88 138/87  Pulse: 89 92 84 96  Resp: 18 18 18 18   Temp: (!) 97.4 F (36.3 C) 97.8 F (36.6 C) 97.8 F (36.6 C) 98.5 F (36.9 C)  TempSrc: Oral Oral Oral Oral  SpO2: 100% 98% 99% 100%  Weight:    82 kg  Height:         Physical Exam:  General: Adult female in bed in no acute distress at rest   HEENT: Normocephalic atraumatic Eyes: Extraocular movements intact sclera Neck: Supple trachea midline Heart : S1-S2 tachycardic no rub Lungs: Clear to auscultation unlabored on room air Abdomen: Soft distended with obese habitus no guarding for me Extremities: No pitting edema appreciated  Skin: No rash on extremities Access RIJ tunneled catheter   Medications reviewed   Labs:  BMP Latest Ref Rng & Units 09/10/2020 09/09/2020 09/08/2020  Glucose 70 - 99  mg/dL 80 69(L) 88  BUN 6 - 20 mg/dL 12 31(H) 28(H)  Creatinine 0.44 - 1.00 mg/dL 3.95(H) 7.76(H) 7.77(H)  Sodium 135 - 145 mmol/L 137 140 140  Potassium 3.5 - 5.1 mmol/L 3.7 3.5 3.1(L)  Chloride 98 - 111 mmol/L 102 103 104  CO2 22 - 32 mmol/L 25 23 23   Calcium 8.9 - 10.3 mg/dL 8.6(L) 8.7(L) 9.0   Outpatient HD orders:  [facility] TTS EDW 85.5 kg 4 hours 3K/2.5 calcium bath  BF 400/ DF 600 Meds: epogen 2000 units per tx; venofer 50 mg weekly. No calcitriol or hectorol  Tunneled catheter  Assessment/Plan:   # ESRD  - HD per TTS schedule   # Acute pancreatitis  - Pain management and supportive care per primary team.  Would saline lock IV  # HTN  - Controlled on current regimen; would discharge on her current regimen   # Hypokalemia  - ok for liberalized diet; she is on 3K bath here and outpatient   # Anemia CKD  - Provide aranesp 60 mcg weekly to start 9/14  # secondary hyperpara - per outpatient unit not on calcitriol or hectorol. Phos ok  Disposition per primary team - per unit she is to be discharged today.   Claudia Desanctis, MD  09/10/2020 2:12 PM

## 2020-09-10 NOTE — Care Management Important Message (Signed)
Important Message  Patient Details  Name: Tamara [name] MRN: [id number] Date of Birth: April 09, 1976   Medicare Important Message Given:  Yes - Important Message mailed due to current National Emergency  Verbal consent obtained due to current National Emergency  Relationship to patient: Self Contact Name: Vincent Peyer Call Date: 09/10/20  Time: 1119 Phone: [phone] Outcome: Spoke with contact Important Message mailed to: Patient address on file    [place] 09/10/2020, 11:20 AM

## 2020-09-10 NOTE — Discharge Summary (Signed)
Physician Discharge Summary  Tamara [name]:[id number] DOB: [birth year] DOA: 09/05/2020  PCP: Sinda Du, MD  Admit date: 09/05/2020 Discharge date: 09/10/2020  Admitted From: Home Disposition: Home  Recommendations for Outpatient Follow-up:  1. Follow up with PCP in 1-2 weeks 2. Discontinued amlodipine, clonidine, furosemide, HCTZ given her significant orthostatic hypotension likely leading to her syncopal episode.  Blood pressure has been maintained well on metoprolol alone during the hospitalization. 3. Follow-up blood pressure closely outpatient  Home Health: None Equipment/Devices: None  Discharge Condition: Stable CODE STATUS: Full code Diet recommendation: Renal diet  History of present illness:  Tamara [name] is a 44 year old female with past medical history notable for developmental delay, essential hypertension, history of Covid-19 infection with ARDS in February 2021, ER CD on HD TTS and depression/anxiety who presented to the emergency department after syncopal episode.  Patient reported abdominal pain with associated nausea and vomiting.  She missed dialysis day prior to admission and while at the vascular surgery appointment for a schedule ultrasound where she developed a syncopal episode while seated.  She reports he been lightheaded with sitting up or standing but did not notice any chest pain or palpitations.  TRH was consulted for further evaluation of syncopal episode.   Hospital course:  Syncope secondary to orthostatic hypotension Patient presenting to the ED from vascular surgery clinic following syncopal episode while seated.  Patient was noted to be hypotensive on arrival and orthostatic vital signs were performed that showed significant blood pressure drop from a lying to seated position.  Patient was started on IV fluid hydration.  TSH was normal.  Patient on extensive course of antihypertensives to include metoprolol, amlodipine, clonidine, furosemide and  HCTZ.  Patient was continued on her home metoprolol tartrate 100 mg p.o. twice daily; about her clonidine, HCTZ, furosemide and amlodipine were discontinued.  Her blood pressure was monitored during the hospitalization and remained well controlled on metoprolol alone.  Orthostasis has improved but has some mild orthostasis while going from a lying to sitting position but now she has no symptoms.  Patient has been threatening to leave AMA and has signed paperwork but was unable to obtain a ride.  Patient today states no complaints and ready for discharge home and no symptoms while going from a lying to sitting to standing position.  Recommend close follow-up of her blood pressures following discharge.  Acute pancreatitis Patient has been complaining of some abdominal discomfort prior to hospitalization.  Lipase elevated at 53.  CT abdomen/pelvis with peripancreatic infiltration suggesting acute pancreatitis without pancreatic ductal dilation; absence of pancreatic fat may indicate autoimmune pancreatitis. LFTs and total bilirubin within normal limits.  IgG4 level 1 (low), not consistent with autoimmune etiology.  Currently denies abdominal pain or current nausea/vomiting.  Patient continues with poor oral intake, which is consistent with chronic habits per renal navigator.  Patient encouraged to increase oral intake.  ESRD on HD TTS Patient has missed dialysis session on 09/04/2020 due to abdominal pain and nausea and vomiting.  Nephrologist consulted during hospitalization and continued HD during her hospitalization.  Continue dialysis at home unit following discharge.  History of essential hypertension Patient on extensive regimen of antihypertensives at home to include amlodipine 10 mg p.o. daily, metoprolol tartrate 100 mg p.o. twice daily, clonidine, furosemide, and hydrochlorothiazide.  Given her low blood pressures on presentation and significant orthostasis; her amlodipine, clonidine, furosemide,  hydrochlorothiazide were discontinued.  Patient's blood pressure remains controlled on metoprolol tartrate 100 mg p.o. twice daily during hospitalization.  Blood pressure at time of discharge 138/87.  Close follow-up with PCP.  Depression/anxiety Continue Xanax and Remeron  Obesity Body mass index is 30.08 kg/m. Counseled on need for lifestyle changes/weight loss as this complicates all facets of care.  Left ovarian cyst Finding of complex cystic lesion left ovary measuring 4.1 cm, no change.  Outpatient follow-up.  Discharge Diagnoses:  Principal Problem:   Recurrent syncope Active Problems:   Acute on chronic pancreatitis (HCC)   ESRD (end stage renal disease) (HCC)   Orthostatic hypotension   Hypokalemia   Asymptomatic bacteriuria   Acute pancreatitis    Discharge Instructions  Discharge Instructions    Call MD for:  difficulty breathing, headache or visual disturbances   Complete by: As directed    Call MD for:  extreme fatigue   Complete by: As directed    Call MD for:  persistant dizziness or light-headedness   Complete by: As directed    Call MD for:  persistant nausea and vomiting   Complete by: As directed    Call MD for:  severe uncontrolled pain   Complete by: As directed    Call MD for:  temperature >100.4   Complete by: As directed    Diet - low sodium heart healthy   Complete by: As directed    Increase activity slowly   Complete by: As directed      Allergies as of 09/10/2020   No Known Allergies     Medication List    STOP taking these medications   amLODipine-benazepril 10-40 MG capsule Commonly known as: LOTREL   cloNIDine 0.2 MG tablet Commonly known as: CATAPRES   furosemide 40 MG tablet Commonly known as: LASIX   hydrochlorothiazide 12.5 MG capsule Commonly known as: MICROZIDE     TAKE these medications   acetaminophen 325 MG tablet Commonly known as: TYLENOL Take 650 mg by mouth 4 (four) times daily as needed for moderate  pain.   allopurinol 300 MG tablet Commonly known as: ZYLOPRIM Take 300 mg by mouth daily.   ALPRAZolam 0.5 MG tablet Commonly known as: XANAX Take 0.5 mg by mouth 3 (three) times daily as needed. Anxiety   cetirizine 10 MG tablet Commonly known as: ZYRTEC Take 10 mg by mouth daily.   Colcrys 0.6 MG tablet Generic drug: colchicine Take 0.6 mg by mouth 2 (two) times daily.   DULoxetine 60 MG capsule Commonly known as: CYMBALTA Take 60 mg by mouth at bedtime.   HYDROcodone-acetaminophen 5-325 MG tablet Commonly known as: NORCO/VICODIN Take 1 tablet by mouth 4 (four) times daily.   lactulose 10 GM/15ML solution Commonly known as: CHRONULAC Take 20 g by mouth at bedtime.   medroxyPROGESTERone 10 MG tablet Commonly known as: PROVERA TAKE 1 TABLET ONCE DAILY FOR 2 WEEKS. REPEAT EVERY 3 MONTHS What changed: See the new instructions.   metoprolol tartrate 100 MG tablet Commonly known as: LOPRESSOR Take 100 mg by mouth 2 (two) times daily.   mirtazapine 15 MG tablet Commonly known as: REMERON Take 15 mg by mouth at bedtime.   omeprazole 20 MG capsule Commonly known as: PRILOSEC Take 20 mg by mouth daily.       Follow-up Information    [facility] Follow up.   Specialty: Home Health Services Contact information: [place] [address] [phone]        Sinda Du, MD. Schedule an appointment as soon as possible for a visit in 1 week(s).   Specialty:  Pulmonary Disease Contact information: [address] [place] [address] [phone]              No Known Allergies  Consultations:  Nephrology   Procedures/Studies: CT Abdomen Pelvis Wo Contrast  Result Date: 09/05/2020 CLINICAL DATA:  Near syncope after dizziness. Generalized weakness and abdominal pain EXAM: CT ABDOMEN AND PELVIS WITHOUT CONTRAST TECHNIQUE: Multidetector CT imaging of the abdomen and pelvis was performed following the standard  protocol without IV contrast. COMPARISON:  09/01/2020 FINDINGS: Lower chest: Lung bases are clear. Hepatobiliary: No focal liver abnormality is seen. No gallstones, gallbladder wall thickening, or biliary dilatation. Pancreas: Peripancreatic infiltration suggesting acute pancreatitis. No pancreatic ductal dilatation. Absence of pancreatic fat may indicate autoimmune pancreatitis although this distinction would better be made on contrast-enhanced examination. Spleen: Normal in size without focal abnormality. Adrenals/Urinary Tract: Adrenal glands are unremarkable. Kidneys are normal, without renal calculi, focal lesion, or hydronephrosis. Bladder is unremarkable. Stomach/Bowel: Stomach, small bowel, and colon are not abnormally distended. No wall thickening or inflammatory changes are appreciated. The appendix is normal. Vascular/Lymphatic: Normal caliber abdominal aorta. No significant lymphadenopathy. Flattened IVC may indicate hypovolemia. Reproductive: Complex cystic lesion in the left ovary measuring 4.1 cm diameter. No change since previous study. Uterus and right ovary are normal. Other: No free air or free fluid in the abdomen. Abdominal wall musculature appears intact. Musculoskeletal: No destructive bone lesions. IMPRESSION: 1. Peripancreatic infiltration suggesting acute pancreatitis. No pancreatic ductal dilatation. Absence of pancreatic fat may indicate autoimmune pancreatitis although this distinction would better be made on contrast-enhanced examination. 2. Complex cystic lesion in the left ovary measuring 4.1 cm diameter. No change. 3. Flattened IVC may indicate hypovolemia. Electronically Signed   By: Lucienne Capers M.D.   On: 09/05/2020 21:21   DG Chest Port 1 View  Result Date: 09/05/2020 CLINICAL DATA:  Dizziness with near syncope. Rule out fluid overload. EXAM: PORTABLE CHEST 1 VIEW COMPARISON:  Radiograph 01/31/2020 FINDINGS: Right dialysis catheter tips in the SVC. Lung volumes are low.  Heart is normal in size. Unchanged mediastinal contours. No pulmonary edema or pleural effusion. Minor atelectasis in the lung bases. No confluent airspace disease. No acute osseous abnormalities are seen. IMPRESSION: Low lung volumes with bibasilar atelectasis. No findings of fluid overload. Electronically Signed   By: Keith Rake M.D.   On: 09/05/2020 16:44   VAS [place] UPPER EXTREMITY ARTERIAL DUPLEX  Result Date: 09/05/2020 UPPER EXTREMITY DUPLEX STUDY Indications: Preoperative exam.  Performing Technologist: Alvia Grove RVT  Examination Guidelines: A complete evaluation includes B-mode imaging, spectral Doppler, color Doppler, and power Doppler as needed of all accessible portions of each vessel. Bilateral testing is considered an integral part of a complete examination. Limited examinations for reoccurring indications may be performed as noted.  Right Pre-Dialysis Findings: +-----------------------+----------+--------------------+---------+--------+ Location               PSV (cm/s)Intralum. Diam. (cm)Waveform Comments +-----------------------+----------+--------------------+---------+--------+ Brachial Antecub. fossa72        0.29                triphasic         +-----------------------+----------+--------------------+---------+--------+ Radial Art at Wrist    42        0.16                triphasic         +-----------------------+----------+--------------------+---------+--------+ Ulnar Art at Wrist     41        0.13  triphasic         +-----------------------+----------+--------------------+---------+--------+ Left Pre-Dialysis Findings: +-----------------------+----------+--------------------+---------+--------+ Location               PSV (cm/s)Intralum. Diam. (cm)Waveform Comments +-----------------------+----------+--------------------+---------+--------+ Brachial Antecub. fossa63        0.23                triphasic          +-----------------------+----------+--------------------+---------+--------+ Radial Art at Wrist    42        0.14                triphasic         +-----------------------+----------+--------------------+---------+--------+ Ulnar Art at Wrist     20        0.13                triphasic         +-----------------------+----------+--------------------+---------+--------+  Summary:   Measurements above. *See table(s) above for measurements and observations. Electronically signed by Servando Snare MD on 09/05/2020 at 5:25:50 PM.    Final    ECHOCARDIOGRAM COMPLETE  Result Date: 09/06/2020    ECHOCARDIOGRAM REPORT   Patient Name:   Tamara [name] Date of Exam: 09/06/2020 Medical Rec #:  [id number]     Height:       65.0 in Accession #:    [id number]    Weight:       231.0 lb Date of Birth:  1976/04/08    BSA:          2.103 m Patient Age:    34 years      BP:           139/73 mmHg Patient Gender: F             HR:           98 bpm. Exam Location:  Inpatient Procedure: 2D Echo and Intracardiac Opacification Agent Indications:    Syncope 780.2 / R55  History:        Patient has no prior history of Echocardiogram examinations.                 Risk Factors:Hypertension. Past history of COVID 01/2020. End                 stage renal disease, Acute on chronic pancreatitis.  Sonographer:    Darlina Sicilian RDCS Referring Phys: [id number] [place]  1. There is a dynamic mid LV cavity systolic gradient of 23FTDD due to hyperdynamic LVF. Left ventricular ejection fraction, by estimation, is 70 to 75%. The left ventricle has hyperdynamic function. The left ventricle has no regional wall motion abnormalities. There is mild concentric left ventricular hypertrophy. Left ventricular diastolic parameters are consistent with Grade I diastolic dysfunction (impaired relaxation).  2. Right ventricular systolic function is normal. The right ventricular size is normal.  3. A small pericardial effusion is present. The  pericardial effusion is anterior to the right ventricle.  4. The mitral valve is normal in structure. No evidence of mitral valve regurgitation. No evidence of mitral stenosis.  5. The aortic valve is normal in structure. Aortic valve regurgitation is not visualized. No aortic stenosis is present.  6. The inferior vena cava is normal in size with greater than 50% respiratory variability, suggesting right atrial pressure of 3 mmHg. FINDINGS  Left Ventricle: There is a dynamic mid LV cavity systolic gradient of 22GURK due to hyperdynamic LVF. Left ventricular ejection fraction, by estimation, is  70 to 75%. The left ventricle has hyperdynamic function. The left ventricle has no regional wall motion abnormalities. Definity contrast agent was given IV to delineate the left ventricular endocardial borders. The left ventricular internal cavity size was normal in size. There is mild concentric left ventricular hypertrophy. Left ventricular diastolic parameters are consistent with Grade I diastolic dysfunction (impaired relaxation). Normal left ventricular filling pressure. Right Ventricle: The right ventricular size is normal. No increase in right ventricular wall thickness. Right ventricular systolic function is normal. Left Atrium: Left atrial size was normal in size. Right Atrium: Right atrial size was normal in size. Pericardium: A small pericardial effusion is present. The pericardial effusion is anterior to the right ventricle. Mitral Valve: The mitral valve is normal in structure. No evidence of mitral valve regurgitation. No evidence of mitral valve stenosis. Tricuspid Valve: The tricuspid valve is normal in structure. Tricuspid valve regurgitation is not demonstrated. No evidence of tricuspid stenosis. Aortic Valve: The aortic valve is normal in structure. Aortic valve regurgitation is not visualized. No aortic stenosis is present. Pulmonic Valve: The pulmonic valve was normal in structure. Pulmonic valve  regurgitation is trivial. No evidence of pulmonic stenosis. Aorta: The aortic root is normal in size and structure. Venous: The inferior vena cava is normal in size with greater than 50% respiratory variability, suggesting right atrial pressure of 3 mmHg. IAS/Shunts: No atrial level shunt detected by color flow Doppler.  LEFT VENTRICLE PLAX 2D LVIDd:         4.20 cm  Diastology LVIDs:         2.40 cm  LV e' medial:    5.59 cm/s LV PW:         1.30 cm  LV E/e' medial:  11.7 LV IVS:        1.20 cm  LV e' lateral:   8.15 cm/s LVOT diam:     2.00 cm  LV E/e' lateral: 8.0 LV SV:         78 LV SV Index:   37 LVOT Area:     3.14 cm  RIGHT VENTRICLE TAPSE (M-mode): 1.8 cm LEFT ATRIUM             Index       RIGHT ATRIUM          Index LA diam:        3.60 cm 1.71 cm/m  RA Area:     8.67 cm LA Vol (A2C):   35.8 ml 17.02 ml/m RA Volume:   14.20 ml 6.75 ml/m LA Vol (A4C):   24.2 ml 11.51 ml/m LA Biplane Vol: 30.8 ml 14.64 ml/m  AORTIC VALVE LVOT Vmax:   138.00 cm/s LVOT Vmean:  107.000 cm/s LVOT VTI:    0.247 m  AORTA Ao Root diam: 2.90 cm Ao Asc diam:  2.90 cm MITRAL VALVE MV Area (PHT): 4.80 cm     SHUNTS MV Decel Time: 158 msec     Systemic VTI:  0.25 m MV E velocity: 65.60 cm/s   Systemic Diam: 2.00 cm MV A velocity: 104.00 cm/s MV E/A ratio:  0.63 Fransico Him MD Electronically signed by Fransico Him MD Signature Date/Time: 09/06/2020/9:29:06 AM    Final       Subjective: Patient seen and examined at bedside, resting comfortably.  No complaints this morning.  Denies any dizziness from a lying to seated position.  Wishes to discharge home.  Was trying to leave AMA yesterday evening but unable to get a ride.  Denies headache, no visual changes, no chest pain, palpitations, no shortness of breath, no abdominal pain, no weakness.  No acute events overnight per nursing staff.  Discharge Exam: Vitals:   09/10/20 0048 09/10/20 0414  BP: 139/88 138/87  Pulse: 84 96  Resp: 18 18  Temp: 97.8 F (36.6 C) 98.5  F (36.9 C)  SpO2: 99% 100%   Vitals:   09/09/20 1600 09/09/20 1958 09/10/20 0048 09/10/20 0414  BP: 120/76 133/82 139/88 138/87  Pulse: 89 92 84 96  Resp: 18 18 18 18   Temp: (!) 97.4 F (36.3 C) 97.8 F (36.6 C) 97.8 F (36.6 C) 98.5 F (36.9 C)  TempSrc: Oral Oral Oral Oral  SpO2: 100% 98% 99% 100%  Weight:    82 kg  Height:        General: Pt is alert, awake, not in acute distress, overweight Cardiovascular: RRR, S1/S2 +, no rubs, no gallops Respiratory: CTA bilaterally, no wheezing, no rhonchi, oxygenating well on room air Abdominal: Soft, NT, ND, bowel sounds + Extremities: no edema, no cyanosis    The results of significant diagnostics from this hospitalization (including imaging, microbiology, ancillary and laboratory) are listed below for reference.     Microbiology: Recent Results (from the past 240 hour(s))  SARS Coronavirus 2 by RT PCR (hospital order, performed in [facility] hospital lab) Nasopharyngeal Nasopharyngeal Swab     Status: None   Collection Time: 09/05/20  6:05 PM   Specimen: Nasopharyngeal Swab  Result Value Ref Range Status   SARS Coronavirus 2 NEGATIVE NEGATIVE Final    Comment: (NOTE) SARS-CoV-2 target nucleic acids are NOT DETECTED.  The SARS-CoV-2 RNA is generally detectable in upper and lower respiratory specimens during the acute phase of infection. The lowest concentration of SARS-CoV-2 viral copies this assay can detect is 250 copies / mL. A negative result does not preclude SARS-CoV-2 infection and should not be used as the sole basis for treatment or other patient management decisions.  A negative result may occur with improper specimen collection / handling, submission of specimen other than nasopharyngeal swab, presence of viral mutation(s) within the areas targeted by this assay, and inadequate number of viral copies (<250 copies / mL). A negative result must be combined with clinical observations, patient history, and  epidemiological information.  Fact Sheet for Patients:   StrictlyIdeas.no  Fact Sheet for Healthcare Providers: BankingDealers.co.za  This test is not yet approved or  cleared by the [place] FDA and has been authorized for detection and/or diagnosis of SARS-CoV-2 by FDA under an Emergency Use Authorization (EUA).  This EUA will remain in effect (meaning this test can be used) for the duration of the COVID-19 declaration under Section 564(b)(1) of the Act, 21 U.S.C. section 360bbb-3(b)(1), unless the authorization is terminated or revoked sooner.  Performed at [facility], [place] [address] [place], [place] [address]   Urine culture     Status: Abnormal   Collection Time: 09/05/20  6:55 PM   Specimen: Urine, Random  Result Value Ref Range Status   Specimen Description URINE, RANDOM  Final   Special Requests   Final    NONE Performed at [facility], [place] [address] [place], [place] [address]    Culture MULTIPLE SPECIES PRESENT, SUGGEST RECOLLECTION (A)  Final   Report Status 09/07/2020 FINAL  Final     Labs: BNP (last 3 results) No results for input(s): BNP in the last 8760 hours. Basic Metabolic Panel: Recent Labs  Lab 09/06/20 0502 09/07/20 0858 09/08/20 0305 09/09/20 1001 09/10/20 0248  NA 141 139 140 140 137  K 2.9* 3.0* 3.1* 3.5 3.7  CL 101 102 104 103 102  CO2 25 23 23 23 25   GLUCOSE 81 91 88 69* 80  BUN 25* 27* 28* 31* 12  CREATININE 7.24* 7.72* 7.77* 7.76* 3.95*  CALCIUM 8.7* 9.3 9.0 8.7* 8.6*  MG 1.5*  --   --   --   --   PHOS  --   --   --  3.6  --    Liver Function Tests: Recent Labs  Lab 09/05/20 1227 09/09/20 1001  AST 12*  --   ALT 11  --   ALKPHOS 75  --   BILITOT 1.1  --   PROT 5.6*  --   ALBUMIN 3.1* 2.7*   Recent Labs  Lab 09/05/20 1227  LIPASE 53*   No results for input(s): AMMONIA in the last 168 hours. CBC: Recent Labs  Lab 09/06/20 0502 09/07/20 0858  09/08/20 0305 09/09/20 1001 09/10/20 0248  WBC 12.5* 14.3* 11.5* 11.8* 10.3  HGB 9.2* 10.2* 8.8* 8.6* 9.6*  HCT 28.5* 30.6* 26.8* 26.5* 28.1*  MCV 89.3 89.5 88.2 88.9 88.1  PLT 400 487* 414* 366 303   Cardiac Enzymes: No results for input(s): CKTOTAL, CKMB, CKMBINDEX, TROPONINI in the last 168 hours. BNP: Invalid input(s): POCBNP CBG: Recent Labs  Lab 09/08/20 0642 09/09/20 0550 09/09/20 1149 09/09/20 1342 09/10/20 0629  GLUCAP 87 78 83 84 65*   D-Dimer No results for input(s): DDIMER in the last 72 hours. Hgb A1c No results for input(s): HGBA1C in the last 72 hours. Lipid Profile No results for input(s): CHOL, HDL, LDLCALC, TRIG, CHOLHDL, LDLDIRECT in the last 72 hours. Thyroid function studies No results for input(s): TSH, T4TOTAL, T3FREE, THYROIDAB in the last 72 hours.  Invalid input(s): FREET3 Anemia work up No results for input(s): VITAMINB12, FOLATE, FERRITIN, TIBC, IRON, RETICCTPCT in the last 72 hours. Urinalysis    Component Value Date/Time   COLORURINE AMBER (A) 09/05/2020 1755   APPEARANCEUR CLOUDY (A) 09/05/2020 1755   APPEARANCEUR Clear 09/16/2016 1640   LABSPEC 1.016 09/05/2020 1755   PHURINE 5.0 09/05/2020 1755   GLUCOSEU NEGATIVE 09/05/2020 1755   HGBUR NEGATIVE 09/05/2020 1755   BILIRUBINUR NEGATIVE 09/05/2020 1755   BILIRUBINUR Negative 09/16/2016 1640   KETONESUR NEGATIVE 09/05/2020 1755   PROTEINUR 30 (A) 09/05/2020 1755   UROBILINOGEN 0.2 01/14/2010 1638   NITRITE NEGATIVE 09/05/2020 1755   LEUKOCYTESUR LARGE (A) 09/05/2020 1755   Sepsis Labs Invalid input(s): PROCALCITONIN,  WBC,  LACTICIDVEN Microbiology Recent Results (from the past 240 hour(s))  SARS Coronavirus 2 by RT PCR (hospital order, performed in [place] hospital lab) Nasopharyngeal Nasopharyngeal Swab     Status: None   Collection Time: 09/05/20  6:05 PM   Specimen: Nasopharyngeal Swab  Result Value Ref Range Status   SARS Coronavirus 2 NEGATIVE NEGATIVE Final     Comment: (NOTE) SARS-CoV-2 target nucleic acids are NOT DETECTED.  The SARS-CoV-2 RNA is generally detectable in upper and lower respiratory specimens during the acute phase of infection. The lowest concentration of SARS-CoV-2 viral copies this assay can detect is 250 copies / mL. A negative result does not preclude SARS-CoV-2 infection and should not be used as the sole basis for treatment or other patient management decisions.  A negative result may occur with improper specimen collection / handling, submission of specimen other than nasopharyngeal swab, presence of viral  mutation(s) within the areas targeted by this assay, and inadequate number of viral copies (<250 copies / mL). A negative result must be combined with clinical observations, patient history, and epidemiological information.  Fact Sheet for Patients:   StrictlyIdeas.no  Fact Sheet for Healthcare Providers: BankingDealers.co.za  This test is not yet approved or  cleared by the [place] FDA and has been authorized for detection and/or diagnosis of SARS-CoV-2 by FDA under an Emergency Use Authorization (EUA).  This EUA will remain in effect (meaning this test can be used) for the duration of the COVID-19 declaration under Section 564(b)(1) of the Act, 21 U.S.C. section 360bbb-3(b)(1), unless the authorization is terminated or revoked sooner.  Performed at [facility], [place] [address], [place], [place] [address]   Urine culture     Status: Abnormal   Collection Time: 09/05/20  6:55 PM   Specimen: Urine, Random  Result Value Ref Range Status   Specimen Description URINE, RANDOM  Final   Special Requests   Final    NONE Performed at [facility], [place] [address] [place], [place] [address]    Culture MULTIPLE SPECIES PRESENT, SUGGEST RECOLLECTION (A)  Final   Report Status 09/07/2020 FINAL  Final     Time coordinating discharge: Over 30  minutes  SIGNED:   Donnamarie Poag [place] DO  [facility] 09/10/2020, 11:22 AM

## 2020-09-10 NOTE — Progress Notes (Signed)
Tamara [name]  to be D/C'd Home per MD order.  Discussed with the patient and all questions fully answered.   VSS, Skin clean, dry and intact without evidence of skin break down, no evidence of skin tears noted. IV catheter discontinued intact. Site without signs and symptoms of complications. Dressing and pressure applied.   An After Visit Summary was printed and given to the patient.    D/C education completed with patient/family including follow up instructions, medication list, d/c activities limitations if indicated, with other d/c instructions as indicated by MD - patient able to verbalize understanding, all questions fully answered.    Patient instructed to return to ED, call 911, or call MD for any changes in condition.    Patient escorted via [place], and D/C home via car with family.

## 2020-09-10 NOTE — Discharge Instructions (Signed)

## 2020-09-10 NOTE — TOC Transition Note (Signed)
Transition of Care [facility]) - CM/SW Discharge Note   Patient Details  Name: Tamara [name] MRN: [id number] Date of Birth: 1976/02/11  Transition of Care [facility]) CM/SW Contact:  Marilu Favre, RN Phone Number: 09/10/2020, 1:36 PM   Clinical Narrative:     [name] with [name] aware patient discharging today.   Spoke to patient at bedside. Patient consented for NCM to call her mother [name]. [name] answered phone, she has a ride coming to pick her up [name]) and then come to hospital to pick up [name].   Final next level of care: [name] Barriers to Discharge: Continued Medical Work up   Patient Goals and CMS Choice Patient states their goals for this hospitalization and ongoing recovery are:: to return home with her mother CMS Medicare.gov Compare Post Acute Care list provided to:: Patient Choice offered to / list presented to : Patient  Discharge Placement                       Discharge Plan and Services     Post Acute Care Choice: Home Health          DME Arranged: N/A DME Agency: NA       HH Arranged: PT HH Agency: [name] Date [facility] Agency Contacted: 09/08/20 Time [place]: [id number] Representative spoke with at [place]: [name] ([name]) Interventions     Readmission Risk Interventions No flowsheet data found.

## 2020-09-12 ENCOUNTER — Encounter (HOSPITAL_COMMUNITY): Payer: Self-pay | Admitting: *Deleted

## 2020-09-12 ENCOUNTER — Other Ambulatory Visit: Payer: Self-pay

## 2020-09-12 ENCOUNTER — Emergency Department (HOSPITAL_COMMUNITY)
Admission: EM | Admit: 2020-09-12 | Discharge: 2020-09-12 | Disposition: A | Discharge status: Home / Self Care | Payer: Medicare Other | Attending: Emergency Medicine | Admitting: Emergency Medicine

## 2020-09-12 DIAGNOSIS — R112 Nausea with vomiting, unspecified: Secondary | ICD-10-CM | POA: Diagnosis not present

## 2020-09-12 DIAGNOSIS — I120 Hypertensive chronic kidney disease with stage 5 chronic kidney disease or end stage renal disease: Secondary | ICD-10-CM | POA: Insufficient documentation

## 2020-09-12 DIAGNOSIS — N186 End stage renal disease: Secondary | ICD-10-CM | POA: Diagnosis not present

## 2020-09-12 DIAGNOSIS — Z79899 Other long term (current) drug therapy: Secondary | ICD-10-CM | POA: Diagnosis not present

## 2020-09-12 DIAGNOSIS — I10 Essential (primary) hypertension: Secondary | ICD-10-CM | POA: Diagnosis not present

## 2020-09-12 DIAGNOSIS — I959 Hypotension, unspecified: Secondary | ICD-10-CM | POA: Diagnosis not present

## 2020-09-12 DIAGNOSIS — R110 Nausea: Secondary | ICD-10-CM | POA: Diagnosis not present

## 2020-09-12 DIAGNOSIS — R1110 Vomiting, unspecified: Secondary | ICD-10-CM | POA: Insufficient documentation

## 2020-09-12 DIAGNOSIS — R000 Tachycardia, unspecified: Secondary | ICD-10-CM | POA: Diagnosis not present

## 2020-09-12 LAB — CBC WITH DIFFERENTIAL/PLATELET
Abs Immature Granulocytes: 0.06 10*3/uL (ref 0.00–0.07)
Basophils Absolute: 0 10*3/uL (ref 0.0–0.1)
Basophils Relative: 0 %
Eosinophils Absolute: 0 10*3/uL (ref 0.0–0.5)
Eosinophils Relative: 0 %
HCT: 31.5 % — ABNORMAL LOW (ref 36.0–46.0)
Hemoglobin: 10.4 g/dL — ABNORMAL LOW (ref 12.0–15.0)
Immature Granulocytes: 1 %
Lymphocytes Relative: 39 %
Lymphs Abs: 3.8 10*3/uL (ref 0.7–4.0)
MCH: 29.7 pg (ref 26.0–34.0)
MCHC: 33 g/dL (ref 30.0–36.0)
MCV: 90 fL (ref 80.0–100.0)
Monocytes Absolute: 0.6 10*3/uL (ref 0.1–1.0)
Monocytes Relative: 6 %
Neutro Abs: 5.1 10*3/uL (ref 1.7–7.7)
Neutrophils Relative %: 54 %
Platelets: 363 10*3/uL (ref 150–400)
RBC: 3.5 MIL/uL — ABNORMAL LOW (ref 3.87–5.11)
RDW: 18.6 % — ABNORMAL HIGH (ref 11.5–15.5)
WBC: 9.7 10*3/uL (ref 4.0–10.5)
nRBC: 0.4 % — ABNORMAL HIGH (ref 0.0–0.2)

## 2020-09-12 LAB — COMPREHENSIVE METABOLIC PANEL
ALT: 13 U/L (ref 0–44)
AST: 13 U/L — ABNORMAL LOW (ref 15–41)
Albumin: 3.3 g/dL — ABNORMAL LOW (ref 3.5–5.0)
Alkaline Phosphatase: 64 U/L (ref 38–126)
Anion gap: 12 (ref 5–15)
BUN: 32 mg/dL — ABNORMAL HIGH (ref 6–20)
CO2: 26 mmol/L (ref 22–32)
Calcium: 9.5 mg/dL (ref 8.9–10.3)
Chloride: 101 mmol/L (ref 98–111)
Creatinine, Ser: 7.08 mg/dL — ABNORMAL HIGH (ref 0.44–1.00)
GFR calc Af Amer: 8 mL/min — ABNORMAL LOW (ref >60)
GFR calc non Af Amer: 7 mL/min — ABNORMAL LOW (ref >60)
Glucose, Bld: 95 mg/dL (ref 70–99)
Potassium: 3.4 mmol/L — ABNORMAL LOW (ref 3.5–5.1)
Sodium: 139 mmol/L (ref 135–145)
Total Bilirubin: 1 mg/dL (ref 0.3–1.2)
Total Protein: 6 g/dL — ABNORMAL LOW (ref 6.5–8.1)

## 2020-09-12 LAB — LIPASE, BLOOD: Lipase: 61 U/L — ABNORMAL HIGH (ref 11–51)

## 2020-09-12 LAB — HCG, SERUM, QUALITATIVE: Preg, Serum: NEGATIVE

## 2020-09-12 NOTE — Discharge Instructions (Signed)
If you develop worsening, continued, or recurrent abdominal pain, uncontrolled vomiting, fever, chest or back pain, or any other new/concerning symptoms then return to the ER for evaluation.  

## 2020-09-12 NOTE — ED Notes (Signed)
Reminded patient that a urine specimen is needed.

## 2020-09-12 NOTE — ED Notes (Signed)
Call to pt mother [phone]  With whom pt lives   She reports she will "call around" to see if she can find someone to pick pt up  And bring home   Pt is discharged, and because of her disability will remain in the ER treatment area until a ride is provided for her to go home

## 2020-09-12 NOTE — ED Provider Notes (Signed)
[facility] EMERGENCY DEPARTMENT Provider Note   CSN: [id number] Arrival date & time: 09/12/20  1351     History Chief Complaint  Patient presents with  . Emesis    Tamara [name] is a 44 y.o. female.  HPI 44 year old female presents with vomiting.  History is limited as the patient has developmental delay and thus is a poor historian.  She endorses chronic abdominal pain though no pain right now.  She was vomiting earlier in the day which she states prompted her sibling's ex-girlfriend to call 911.  Right now she states she feels okay.  Last went to dialysis 2 days ago but did not go today. She denies chest pain or other acute illness currently.   Past Medical History:  Diagnosis Date  . Abnormal uterine bleeding (AUB) 01/26/2016  . Anxiety and depression    mentally slow  . Chronic abdominal pain   . Depression   . Hematuria 01/26/2016  . Hypertension   . Leg pain   . Obesity   . Ovarian cyst 02/10/2016  . PUD (peptic ulcer disease)   . Reflux   . S/P colonoscopy 03/15/11   friable anal canal  . Sebaceous cyst of labia 01/26/2016  . Yeast vaginitis 09/19/2013    Patient Active Problem List   Diagnosis Date Noted  . Acute pancreatitis 09/06/2020  . Acute on chronic pancreatitis ([place]) 09/05/2020  . ESRD (end stage renal disease) ([place]) 09/05/2020  . Orthostatic hypotension 09/05/2020  . Recurrent syncope 09/05/2020  . Hypokalemia 09/05/2020  . Asymptomatic bacteriuria 09/05/2020  . Ovarian cyst 02/10/2016  . Abnormal uterine bleeding (AUB) 01/26/2016  . Hematuria 01/26/2016  . Sebaceous cyst of labia 01/26/2016  . Yeast vaginitis 09/19/2013  . Obesity 07/16/2011  . RECTAL BLEEDING 02/23/2011  . Abdominal pain 10/05/2010  . HYPERTENSION NEC 09/28/2010    Past Surgical History:  Procedure Laterality Date  . TOOTH EXTRACTION N/A 06/03/2014   Procedure: EXTRACTION WISDOM TEETH;  Surgeon: Gae Bon, DDS;  Location: [place];  Service: Oral Surgery;  Laterality: N/A;    . TUBAL LIGATION       OB History    Gravida      Para      Term      Preterm      AB      Living  0     SAB      TAB      Ectopic      Multiple      Live Births              Family History  Problem Relation Age of Onset  . Colon polyps Father   . Hypertension Father   . Diabetes Father   . Seizures Mother   . Hypertension Mother   . Diabetes Mother   . Diabetes Paternal Grandmother   . Colon cancer Neg Hx     Social History   Tobacco Use  . Smoking status: Never Smoker  . Smokeless tobacco: Never Used  Vaping Use  . Vaping Use: Never used  Substance Use Topics  . Alcohol use: No  . Drug use: No    Home Medications Prior to Admission medications   Medication Sig Start Date End Date Taking? Authorizing Provider  acetaminophen (TYLENOL) 325 MG tablet Take 650 mg by mouth 4 (four) times daily as needed for moderate pain.     [provider]  allopurinol (ZYLOPRIM) 300 MG tablet Take 300 mg by mouth  daily.  09/05/13   [provider]  ALPRAZolam Duanne Moron) 0.5 MG tablet Take 0.5 mg by mouth 3 (three) times daily as needed. Anxiety    [provider]  amLODIPine Besy-Benazepril HCl (LOTREL PO) Take 1 capsule by mouth daily.    [provider]  cetirizine (ZYRTEC) 10 MG tablet Take 10 mg by mouth daily.    [provider]  chlorthalidone (HYGROTON) 25 MG tablet Take 25 mg by mouth daily.    [provider]  cloNIDine (CATAPRES) 0.2 MG tablet Take 0.2 mg by mouth 3 (three) times daily.    [provider]  COLCRYS 0.6 MG tablet Take 0.6 mg by mouth 2 (two) times daily.  09/15/13   [provider]  colestipol (COLESTID) 1 g tablet Take 1 g by mouth in the morning, at noon, and at bedtime.    [provider]  DULoxetine (CYMBALTA) 60 MG capsule Take 60 mg by mouth at bedtime.      [provider]  furosemide (LASIX) 40 MG tablet Take 40 mg by mouth daily.    [provider]  HYDROcodone-acetaminophen (NORCO/VICODIN) 5-325 MG per tablet Take 1 tablet by mouth 4 (four) times daily.  12/13/14   [provider]  lactulose (CHRONULAC) 10 GM/15ML solution Take 20 g by mouth at bedtime.    [provider]  medroxyPROGESTERone (PROVERA) 10 MG tablet TAKE 1 TABLET ONCE DAILY FOR 2 WEEKS. REPEAT EVERY 3 MONTHS Patient taking differently: Take 10 mg by mouth See admin instructions. Take 1 tablet for 2 weeks, repeat every 3 months 12/23/19   [name], MD  metoCLOPramide (REGLAN) 5 MG tablet Take 5 mg by mouth 3 (three) times daily as needed for nausea or vomiting.  09/03/20   [provider]  metoprolol (LOPRESSOR) 100 MG tablet Take 100 mg by mouth 2 (two) times daily.  11/29/14   [provider]  midodrine (PROAMATINE) 10 MG tablet Take 10 mg by mouth 3 (three) times a week.    [provider]  mirtazapine (REMERON) 15 MG tablet Take 15 mg by mouth at bedtime. 08/27/20   [provider]  omeprazole (PRILOSEC) 20 MG capsule Take 20 mg by mouth daily.      [provider]  ondansetron (ZOFRAN-ODT) 4 MG disintegrating tablet Take 4 mg by mouth every 8 (eight) hours as needed for nausea or vomiting.    [provider]  oxyCODONE-acetaminophen (PERCOCET/ROXICET) 5-325 MG tablet Take 2 tablets by mouth every 6 (six) hours as needed for moderate pain.  09/02/20   [provider]  Pancrelipase, Lip-Prot-Amyl, (CREON) 24000-76000 units CPEP Take 1 capsule by mouth in the morning, at noon, in the evening, and at bedtime.     [provider]    Allergies    Patient has no known allergies.  Review of Systems   Review of Systems  Unable to perform ROS: Other    Physical Exam Updated Vital Signs BP (!) 118/59   Pulse 87   Temp 97.8 F (36.6 C) (Oral)   Resp 15   Ht 5\' 5"  (1.651 m)   Wt 82 kg   SpO2 100%   BMI 30.08 kg/m   Physical Exam Vitals and nursing note reviewed.   Constitutional:      General: She is not in acute distress.    Appearance: She is well-developed. She is not ill-appearing or diaphoretic.  HENT:     Head: Normocephalic and atraumatic.  Right Ear: External ear normal.     Left Ear: External ear normal.     Nose: Nose normal.  Eyes:     General:        Right eye: No discharge.        Left eye: No discharge.  Cardiovascular:     Rate and Rhythm: Normal rate and regular rhythm.     Heart sounds: Normal heart sounds.  Pulmonary:     Effort: Pulmonary effort is normal.     Breath sounds: Normal breath sounds.  Abdominal:     General: There is no distension.     Palpations: Abdomen is soft.     Tenderness: There is no abdominal tenderness.  Skin:    General: Skin is warm and dry.  Neurological:     Mental Status: She is alert.  Psychiatric:        Mood and Affect: Mood is not anxious.     ED Results / Procedures / Treatments   Labs (all labs ordered are listed, but only abnormal results are displayed) Labs Reviewed  COMPREHENSIVE METABOLIC PANEL - Abnormal; Notable for the following components:      Result Value   Potassium 3.4 (*)    BUN 32 (*)    Creatinine, Ser 7.08 (*)    Total Protein 6.0 (*)    Albumin 3.3 (*)    AST 13 (*)    GFR calc non Af Amer 7 (*)    GFR calc Af Amer 8 (*)    All other components within normal limits  LIPASE, BLOOD - Abnormal; Notable for the following components:   Lipase 61 (*)    All other components within normal limits  CBC WITH DIFFERENTIAL/PLATELET - Abnormal; Notable for the following components:   RBC 3.50 (*)    Hemoglobin 10.4 (*)    HCT 31.5 (*)    RDW 18.6 (*)    nRBC 0.4 (*)    All other components within normal limits  HCG, SERUM, QUALITATIVE  URINALYSIS, ROUTINE W REFLEX MICROSCOPIC  I-STAT BETA HCG BLOOD, ED ([facility], [facility], [facility] ONLY)    EKG EKG Interpretation  Date/Time:  Friday September 12 2020 20:13:58 EDT Ventricular Rate:  78 PR Interval:    QRS  Duration: 103 QT Interval:  423 QTC Calculation: 482 R Axis:   -32 Text Interpretation: Sinus rhythm Left axis deviation no significant change since earlier in the day Confirmed by Sherwood Gambler [phone]) on 09/12/2020 8:50:45 PM   Radiology No results found.  Procedures Procedures (including critical care time)  Medications Ordered in ED Medications - No data to display  ED Course  I have reviewed the triage vital signs and the nursing notes.  Pertinent labs & imaging results that were available during my care of the patient were reviewed by me and considered in my medical decision making (see chart for details).    MDM Rules/Calculators/A&P                          Patient initially had some tachycardia but otherwise her vitals are now normal/benign.  She has no abdominal tenderness on repeat exams.  There is some developmental delay but otherwise I do not find any emergent pathology.  Her labs are near baseline with slightly elevated lipase and stable hemoglobin.  Her potassium is slightly low.  She will need to go to dialysis tomorrow as she missed it but does not appear to have an  emergent need for dialysis.  At this point, she appears stable for discharge.  I attempted to call her mom multiple times with no response. Final Clinical Impression(s) / ED Diagnoses Final diagnoses:  Vomiting in adult    Rx / DC Orders ED Discharge Orders    None       Sherwood Gambler, MD 09/12/20 2217

## 2020-09-12 NOTE — ED Notes (Signed)
Pt with hx of developmental delay Was at [facility] 9/10 until 9/15 for "falling out" Per pt   She reports last dialysis was on last thursday   Here for evaluation

## 2020-09-12 NOTE — ED Notes (Signed)
Patient asking for food at this time. Reminded patient that if she is having stomach pains I can not give her food at this time. Patient states that she understands. Will make RN aware.

## 2020-09-12 NOTE — ED Triage Notes (Signed)
Pt was brought in by [facility] for c/o n/v; pt was at [facility] for pancreatitis and she checked out AMA; pt was supposed to have dialysis yesterday but was not able to go due to her n/v

## 2020-09-12 NOTE — ED Notes (Signed)
Call to [phone] to ascertain if patient lives with a relative   No answer

## 2020-09-16 ENCOUNTER — Other Ambulatory Visit: Payer: Self-pay

## 2020-09-16 DIAGNOSIS — N186 End stage renal disease: Secondary | ICD-10-CM

## 2020-09-17 DIAGNOSIS — Z882 Allergy status to sulfonamides status: Secondary | ICD-10-CM | POA: Diagnosis not present

## 2020-09-17 DIAGNOSIS — D631 Anemia in chronic kidney disease: Secondary | ICD-10-CM | POA: Diagnosis not present

## 2020-09-17 DIAGNOSIS — Z880 Allergy status to penicillin: Secondary | ICD-10-CM | POA: Diagnosis not present

## 2020-09-17 DIAGNOSIS — J9811 Atelectasis: Secondary | ICD-10-CM | POA: Diagnosis not present

## 2020-09-17 DIAGNOSIS — K861 Other chronic pancreatitis: Secondary | ICD-10-CM | POA: Diagnosis not present

## 2020-09-17 DIAGNOSIS — Z9181 History of falling: Secondary | ICD-10-CM | POA: Diagnosis not present

## 2020-09-17 DIAGNOSIS — W19XXXA Unspecified fall, initial encounter: Secondary | ICD-10-CM | POA: Diagnosis not present

## 2020-09-17 DIAGNOSIS — N186 End stage renal disease: Secondary | ICD-10-CM | POA: Diagnosis not present

## 2020-09-17 DIAGNOSIS — I1311 Hypertensive heart and chronic kidney disease without heart failure, with stage 5 chronic kidney disease, or end stage renal disease: Secondary | ICD-10-CM | POA: Diagnosis not present

## 2020-09-17 DIAGNOSIS — R000 Tachycardia, unspecified: Secondary | ICD-10-CM | POA: Diagnosis not present

## 2020-09-17 DIAGNOSIS — I444 Left anterior fascicular block: Secondary | ICD-10-CM | POA: Diagnosis not present

## 2020-09-17 DIAGNOSIS — R404 Transient alteration of awareness: Secondary | ICD-10-CM | POA: Diagnosis not present

## 2020-09-17 DIAGNOSIS — D696 Thrombocytopenia, unspecified: Secondary | ICD-10-CM | POA: Diagnosis not present

## 2020-09-17 DIAGNOSIS — R9431 Abnormal electrocardiogram [ECG] [EKG]: Secondary | ICD-10-CM | POA: Diagnosis not present

## 2020-09-17 DIAGNOSIS — Z8711 Personal history of peptic ulcer disease: Secondary | ICD-10-CM | POA: Diagnosis not present

## 2020-09-17 DIAGNOSIS — Z743 Need for continuous supervision: Secondary | ICD-10-CM | POA: Diagnosis not present

## 2020-09-17 DIAGNOSIS — R0689 Other abnormalities of breathing: Secondary | ICD-10-CM | POA: Diagnosis not present

## 2020-09-17 DIAGNOSIS — R531 Weakness: Secondary | ICD-10-CM | POA: Diagnosis not present

## 2020-09-17 DIAGNOSIS — I120 Hypertensive chronic kidney disease with stage 5 chronic kidney disease or end stage renal disease: Secondary | ICD-10-CM | POA: Diagnosis not present

## 2020-09-17 DIAGNOSIS — E876 Hypokalemia: Secondary | ICD-10-CM | POA: Diagnosis not present

## 2020-09-17 DIAGNOSIS — Z992 Dependence on renal dialysis: Secondary | ICD-10-CM | POA: Diagnosis not present

## 2020-09-17 DIAGNOSIS — I313 Pericardial effusion (noninflammatory): Secondary | ICD-10-CM | POA: Diagnosis not present

## 2020-09-17 DIAGNOSIS — R55 Syncope and collapse: Secondary | ICD-10-CM | POA: Diagnosis not present

## 2020-09-17 DIAGNOSIS — K219 Gastro-esophageal reflux disease without esophagitis: Secondary | ICD-10-CM | POA: Diagnosis not present

## 2020-09-17 DIAGNOSIS — I951 Orthostatic hypotension: Secondary | ICD-10-CM | POA: Diagnosis not present

## 2020-09-18 DIAGNOSIS — R55 Syncope and collapse: Secondary | ICD-10-CM | POA: Diagnosis not present

## 2020-09-19 DIAGNOSIS — Z9181 History of falling: Secondary | ICD-10-CM | POA: Diagnosis not present

## 2020-09-19 DIAGNOSIS — R112 Nausea with vomiting, unspecified: Secondary | ICD-10-CM | POA: Diagnosis not present

## 2020-09-19 DIAGNOSIS — Z8674 Personal history of sudden cardiac arrest: Secondary | ICD-10-CM | POA: Diagnosis not present

## 2020-09-19 DIAGNOSIS — D649 Anemia, unspecified: Secondary | ICD-10-CM | POA: Diagnosis not present

## 2020-09-19 DIAGNOSIS — N186 End stage renal disease: Secondary | ICD-10-CM | POA: Diagnosis not present

## 2020-09-19 DIAGNOSIS — M6281 Muscle weakness (generalized): Secondary | ICD-10-CM | POA: Diagnosis not present

## 2020-09-19 DIAGNOSIS — T465X5A Adverse effect of other antihypertensive drugs, initial encounter: Secondary | ICD-10-CM | POA: Diagnosis not present

## 2020-09-19 DIAGNOSIS — N250 Renal osteodystrophy: Secondary | ICD-10-CM | POA: Diagnosis not present

## 2020-09-19 DIAGNOSIS — U099 Post covid-19 condition, unspecified: Secondary | ICD-10-CM | POA: Diagnosis not present

## 2020-09-19 DIAGNOSIS — R1084 Generalized abdominal pain: Secondary | ICD-10-CM | POA: Diagnosis not present

## 2020-09-19 DIAGNOSIS — I10 Essential (primary) hypertension: Secondary | ICD-10-CM | POA: Diagnosis not present

## 2020-09-19 DIAGNOSIS — Z7401 Bed confinement status: Secondary | ICD-10-CM | POA: Diagnosis not present

## 2020-09-19 DIAGNOSIS — I959 Hypotension, unspecified: Secondary | ICD-10-CM | POA: Diagnosis not present

## 2020-09-19 DIAGNOSIS — R296 Repeated falls: Secondary | ICD-10-CM | POA: Diagnosis not present

## 2020-09-19 DIAGNOSIS — R55 Syncope and collapse: Secondary | ICD-10-CM | POA: Diagnosis not present

## 2020-09-19 DIAGNOSIS — R936 Abnormal findings on diagnostic imaging of limbs: Secondary | ICD-10-CM | POA: Diagnosis not present

## 2020-09-19 DIAGNOSIS — R197 Diarrhea, unspecified: Secondary | ICD-10-CM | POA: Diagnosis not present

## 2020-09-19 DIAGNOSIS — D631 Anemia in chronic kidney disease: Secondary | ICD-10-CM | POA: Diagnosis not present

## 2020-09-19 DIAGNOSIS — R410 Disorientation, unspecified: Secondary | ICD-10-CM | POA: Diagnosis not present

## 2020-09-19 DIAGNOSIS — Z992 Dependence on renal dialysis: Secondary | ICD-10-CM | POA: Diagnosis not present

## 2020-09-19 DIAGNOSIS — R6250 Unspecified lack of expected normal physiological development in childhood: Secondary | ICD-10-CM | POA: Diagnosis not present

## 2020-09-19 DIAGNOSIS — I4581 Long QT syndrome: Secondary | ICD-10-CM | POA: Diagnosis not present

## 2020-09-19 DIAGNOSIS — T444X6A Underdosing of predominantly alpha-adrenoreceptor agonists, initial encounter: Secondary | ICD-10-CM | POA: Diagnosis not present

## 2020-09-19 DIAGNOSIS — R404 Transient alteration of awareness: Secondary | ICD-10-CM | POA: Diagnosis not present

## 2020-09-19 DIAGNOSIS — K859 Acute pancreatitis without necrosis or infection, unspecified: Secondary | ICD-10-CM | POA: Diagnosis not present

## 2020-09-19 DIAGNOSIS — R1310 Dysphagia, unspecified: Secondary | ICD-10-CM | POA: Diagnosis not present

## 2020-09-19 DIAGNOSIS — K219 Gastro-esophageal reflux disease without esophagitis: Secondary | ICD-10-CM | POA: Diagnosis not present

## 2020-09-19 DIAGNOSIS — I499 Cardiac arrhythmia, unspecified: Secondary | ICD-10-CM | POA: Diagnosis not present

## 2020-09-19 DIAGNOSIS — E860 Dehydration: Secondary | ICD-10-CM | POA: Diagnosis not present

## 2020-09-19 DIAGNOSIS — K5900 Constipation, unspecified: Secondary | ICD-10-CM | POA: Diagnosis not present

## 2020-09-19 DIAGNOSIS — R109 Unspecified abdominal pain: Secondary | ICD-10-CM | POA: Diagnosis not present

## 2020-09-19 DIAGNOSIS — I951 Orthostatic hypotension: Secondary | ICD-10-CM | POA: Diagnosis not present

## 2020-09-19 DIAGNOSIS — Z9115 Patient's noncompliance with renal dialysis: Secondary | ICD-10-CM | POA: Diagnosis not present

## 2020-09-20 DIAGNOSIS — N186 End stage renal disease: Secondary | ICD-10-CM | POA: Diagnosis not present

## 2020-09-20 DIAGNOSIS — Z992 Dependence on renal dialysis: Secondary | ICD-10-CM | POA: Diagnosis not present

## 2020-09-20 DIAGNOSIS — R55 Syncope and collapse: Secondary | ICD-10-CM | POA: Diagnosis not present

## 2020-09-20 DIAGNOSIS — R6250 Unspecified lack of expected normal physiological development in childhood: Secondary | ICD-10-CM | POA: Diagnosis not present

## 2020-09-20 DIAGNOSIS — D631 Anemia in chronic kidney disease: Secondary | ICD-10-CM | POA: Diagnosis not present

## 2020-09-20 DIAGNOSIS — D649 Anemia, unspecified: Secondary | ICD-10-CM | POA: Diagnosis not present

## 2020-09-20 DIAGNOSIS — I959 Hypotension, unspecified: Secondary | ICD-10-CM | POA: Diagnosis not present

## 2020-09-21 DIAGNOSIS — I959 Hypotension, unspecified: Secondary | ICD-10-CM | POA: Diagnosis not present

## 2020-09-21 DIAGNOSIS — N186 End stage renal disease: Secondary | ICD-10-CM | POA: Diagnosis not present

## 2020-09-21 DIAGNOSIS — R109 Unspecified abdominal pain: Secondary | ICD-10-CM | POA: Diagnosis not present

## 2020-09-21 DIAGNOSIS — D631 Anemia in chronic kidney disease: Secondary | ICD-10-CM | POA: Diagnosis not present

## 2020-09-21 DIAGNOSIS — R6250 Unspecified lack of expected normal physiological development in childhood: Secondary | ICD-10-CM | POA: Diagnosis not present

## 2020-09-21 DIAGNOSIS — D649 Anemia, unspecified: Secondary | ICD-10-CM | POA: Diagnosis not present

## 2020-09-21 DIAGNOSIS — R55 Syncope and collapse: Secondary | ICD-10-CM | POA: Diagnosis not present

## 2020-09-21 DIAGNOSIS — Z992 Dependence on renal dialysis: Secondary | ICD-10-CM | POA: Diagnosis not present

## 2020-09-22 DIAGNOSIS — I959 Hypotension, unspecified: Secondary | ICD-10-CM | POA: Diagnosis not present

## 2020-09-22 DIAGNOSIS — Z992 Dependence on renal dialysis: Secondary | ICD-10-CM | POA: Diagnosis not present

## 2020-09-22 DIAGNOSIS — R55 Syncope and collapse: Secondary | ICD-10-CM | POA: Diagnosis not present

## 2020-09-22 DIAGNOSIS — R6250 Unspecified lack of expected normal physiological development in childhood: Secondary | ICD-10-CM | POA: Diagnosis not present

## 2020-09-22 DIAGNOSIS — D649 Anemia, unspecified: Secondary | ICD-10-CM | POA: Diagnosis not present

## 2020-09-22 DIAGNOSIS — D631 Anemia in chronic kidney disease: Secondary | ICD-10-CM | POA: Diagnosis not present

## 2020-09-22 DIAGNOSIS — N186 End stage renal disease: Secondary | ICD-10-CM | POA: Diagnosis not present

## 2020-09-23 DIAGNOSIS — R55 Syncope and collapse: Secondary | ICD-10-CM | POA: Diagnosis not present

## 2020-09-23 DIAGNOSIS — R6250 Unspecified lack of expected normal physiological development in childhood: Secondary | ICD-10-CM | POA: Diagnosis not present

## 2020-09-23 DIAGNOSIS — N186 End stage renal disease: Secondary | ICD-10-CM | POA: Diagnosis not present

## 2020-09-23 DIAGNOSIS — D631 Anemia in chronic kidney disease: Secondary | ICD-10-CM | POA: Diagnosis not present

## 2020-09-23 DIAGNOSIS — Z992 Dependence on renal dialysis: Secondary | ICD-10-CM | POA: Diagnosis not present

## 2020-09-23 DIAGNOSIS — D649 Anemia, unspecified: Secondary | ICD-10-CM | POA: Diagnosis not present

## 2020-09-23 DIAGNOSIS — I959 Hypotension, unspecified: Secondary | ICD-10-CM | POA: Diagnosis not present

## 2020-09-24 DIAGNOSIS — Z9181 History of falling: Secondary | ICD-10-CM | POA: Diagnosis not present

## 2020-09-24 DIAGNOSIS — I959 Hypotension, unspecified: Secondary | ICD-10-CM | POA: Diagnosis not present

## 2020-09-24 DIAGNOSIS — N186 End stage renal disease: Secondary | ICD-10-CM | POA: Diagnosis not present

## 2020-09-24 DIAGNOSIS — R55 Syncope and collapse: Secondary | ICD-10-CM | POA: Diagnosis not present

## 2020-09-24 DIAGNOSIS — D631 Anemia in chronic kidney disease: Secondary | ICD-10-CM | POA: Diagnosis not present

## 2020-09-24 DIAGNOSIS — D649 Anemia, unspecified: Secondary | ICD-10-CM | POA: Diagnosis not present

## 2020-09-24 DIAGNOSIS — Z992 Dependence on renal dialysis: Secondary | ICD-10-CM | POA: Diagnosis not present

## 2020-09-24 DIAGNOSIS — R6250 Unspecified lack of expected normal physiological development in childhood: Secondary | ICD-10-CM | POA: Diagnosis not present

## 2020-09-25 DIAGNOSIS — R55 Syncope and collapse: Secondary | ICD-10-CM | POA: Diagnosis not present

## 2020-09-25 DIAGNOSIS — D631 Anemia in chronic kidney disease: Secondary | ICD-10-CM | POA: Diagnosis not present

## 2020-09-25 DIAGNOSIS — R6250 Unspecified lack of expected normal physiological development in childhood: Secondary | ICD-10-CM | POA: Diagnosis not present

## 2020-09-25 DIAGNOSIS — N186 End stage renal disease: Secondary | ICD-10-CM | POA: Diagnosis not present

## 2020-09-25 DIAGNOSIS — Z992 Dependence on renal dialysis: Secondary | ICD-10-CM | POA: Diagnosis not present

## 2020-09-25 DIAGNOSIS — D649 Anemia, unspecified: Secondary | ICD-10-CM | POA: Diagnosis not present

## 2020-09-25 DIAGNOSIS — I959 Hypotension, unspecified: Secondary | ICD-10-CM | POA: Diagnosis not present

## 2020-09-25 DIAGNOSIS — Z9181 History of falling: Secondary | ICD-10-CM | POA: Diagnosis not present

## 2020-09-26 ENCOUNTER — Inpatient Hospital Stay (HOSPITAL_COMMUNITY): Admission: RE | Admit: 2020-09-26 | Payer: Medicare Other | Source: Ambulatory Visit

## 2020-09-26 ENCOUNTER — Encounter: Payer: Medicare Other | Admitting: Vascular Surgery

## 2020-09-26 DIAGNOSIS — N186 End stage renal disease: Secondary | ICD-10-CM | POA: Diagnosis not present

## 2020-09-26 DIAGNOSIS — R55 Syncope and collapse: Secondary | ICD-10-CM | POA: Diagnosis not present

## 2020-09-26 DIAGNOSIS — D649 Anemia, unspecified: Secondary | ICD-10-CM | POA: Diagnosis not present

## 2020-09-26 DIAGNOSIS — D631 Anemia in chronic kidney disease: Secondary | ICD-10-CM | POA: Diagnosis not present

## 2020-09-26 DIAGNOSIS — R6250 Unspecified lack of expected normal physiological development in childhood: Secondary | ICD-10-CM | POA: Diagnosis not present

## 2020-09-26 DIAGNOSIS — Z9181 History of falling: Secondary | ICD-10-CM | POA: Diagnosis not present

## 2020-09-26 DIAGNOSIS — Z992 Dependence on renal dialysis: Secondary | ICD-10-CM | POA: Diagnosis not present

## 2020-09-26 DIAGNOSIS — I959 Hypotension, unspecified: Secondary | ICD-10-CM | POA: Diagnosis not present

## 2020-09-27 DIAGNOSIS — R55 Syncope and collapse: Secondary | ICD-10-CM | POA: Diagnosis not present

## 2020-09-27 DIAGNOSIS — D631 Anemia in chronic kidney disease: Secondary | ICD-10-CM | POA: Diagnosis not present

## 2020-09-27 DIAGNOSIS — D649 Anemia, unspecified: Secondary | ICD-10-CM | POA: Diagnosis not present

## 2020-09-27 DIAGNOSIS — N186 End stage renal disease: Secondary | ICD-10-CM | POA: Diagnosis not present

## 2020-09-27 DIAGNOSIS — Z9181 History of falling: Secondary | ICD-10-CM | POA: Diagnosis not present

## 2020-09-27 DIAGNOSIS — Z992 Dependence on renal dialysis: Secondary | ICD-10-CM | POA: Diagnosis not present

## 2020-09-27 DIAGNOSIS — I959 Hypotension, unspecified: Secondary | ICD-10-CM | POA: Diagnosis not present

## 2020-09-27 DIAGNOSIS — R6250 Unspecified lack of expected normal physiological development in childhood: Secondary | ICD-10-CM | POA: Diagnosis not present

## 2020-09-28 DIAGNOSIS — N186 End stage renal disease: Secondary | ICD-10-CM | POA: Diagnosis not present

## 2020-09-28 DIAGNOSIS — D631 Anemia in chronic kidney disease: Secondary | ICD-10-CM | POA: Diagnosis not present

## 2020-09-28 DIAGNOSIS — Z992 Dependence on renal dialysis: Secondary | ICD-10-CM | POA: Diagnosis not present

## 2020-09-28 DIAGNOSIS — D649 Anemia, unspecified: Secondary | ICD-10-CM | POA: Diagnosis not present

## 2020-09-28 DIAGNOSIS — I959 Hypotension, unspecified: Secondary | ICD-10-CM | POA: Diagnosis not present

## 2020-09-28 DIAGNOSIS — R55 Syncope and collapse: Secondary | ICD-10-CM | POA: Diagnosis not present

## 2020-09-28 DIAGNOSIS — Z9181 History of falling: Secondary | ICD-10-CM | POA: Diagnosis not present

## 2020-09-28 DIAGNOSIS — R6250 Unspecified lack of expected normal physiological development in childhood: Secondary | ICD-10-CM | POA: Diagnosis not present

## 2020-09-29 DIAGNOSIS — D631 Anemia in chronic kidney disease: Secondary | ICD-10-CM | POA: Diagnosis not present

## 2020-09-29 DIAGNOSIS — I959 Hypotension, unspecified: Secondary | ICD-10-CM | POA: Diagnosis not present

## 2020-09-29 DIAGNOSIS — R6250 Unspecified lack of expected normal physiological development in childhood: Secondary | ICD-10-CM | POA: Diagnosis not present

## 2020-09-29 DIAGNOSIS — Z9181 History of falling: Secondary | ICD-10-CM | POA: Diagnosis not present

## 2020-09-29 DIAGNOSIS — R55 Syncope and collapse: Secondary | ICD-10-CM | POA: Diagnosis not present

## 2020-09-29 DIAGNOSIS — D649 Anemia, unspecified: Secondary | ICD-10-CM | POA: Diagnosis not present

## 2020-09-29 DIAGNOSIS — Z992 Dependence on renal dialysis: Secondary | ICD-10-CM | POA: Diagnosis not present

## 2020-09-29 DIAGNOSIS — N186 End stage renal disease: Secondary | ICD-10-CM | POA: Diagnosis not present

## 2020-09-30 DIAGNOSIS — D649 Anemia, unspecified: Secondary | ICD-10-CM | POA: Diagnosis not present

## 2020-09-30 DIAGNOSIS — N186 End stage renal disease: Secondary | ICD-10-CM | POA: Diagnosis not present

## 2020-09-30 DIAGNOSIS — R55 Syncope and collapse: Secondary | ICD-10-CM | POA: Diagnosis not present

## 2020-09-30 DIAGNOSIS — R6250 Unspecified lack of expected normal physiological development in childhood: Secondary | ICD-10-CM | POA: Diagnosis not present

## 2020-09-30 DIAGNOSIS — Z992 Dependence on renal dialysis: Secondary | ICD-10-CM | POA: Diagnosis not present

## 2020-09-30 DIAGNOSIS — I959 Hypotension, unspecified: Secondary | ICD-10-CM | POA: Diagnosis not present

## 2020-09-30 DIAGNOSIS — Z9181 History of falling: Secondary | ICD-10-CM | POA: Diagnosis not present

## 2020-09-30 DIAGNOSIS — D631 Anemia in chronic kidney disease: Secondary | ICD-10-CM | POA: Diagnosis not present

## 2020-10-01 DIAGNOSIS — N186 End stage renal disease: Secondary | ICD-10-CM | POA: Diagnosis not present

## 2020-10-01 DIAGNOSIS — D649 Anemia, unspecified: Secondary | ICD-10-CM | POA: Diagnosis not present

## 2020-10-01 DIAGNOSIS — I959 Hypotension, unspecified: Secondary | ICD-10-CM | POA: Diagnosis not present

## 2020-10-01 DIAGNOSIS — R55 Syncope and collapse: Secondary | ICD-10-CM | POA: Diagnosis not present

## 2020-10-01 DIAGNOSIS — R6250 Unspecified lack of expected normal physiological development in childhood: Secondary | ICD-10-CM | POA: Diagnosis not present

## 2020-10-01 DIAGNOSIS — Z9181 History of falling: Secondary | ICD-10-CM | POA: Diagnosis not present

## 2020-10-01 DIAGNOSIS — Z992 Dependence on renal dialysis: Secondary | ICD-10-CM | POA: Diagnosis not present

## 2020-10-01 DIAGNOSIS — D631 Anemia in chronic kidney disease: Secondary | ICD-10-CM | POA: Diagnosis not present

## 2020-10-02 DIAGNOSIS — Z9181 History of falling: Secondary | ICD-10-CM | POA: Diagnosis not present

## 2020-10-02 DIAGNOSIS — D631 Anemia in chronic kidney disease: Secondary | ICD-10-CM | POA: Diagnosis not present

## 2020-10-02 DIAGNOSIS — D649 Anemia, unspecified: Secondary | ICD-10-CM | POA: Diagnosis not present

## 2020-10-02 DIAGNOSIS — R6250 Unspecified lack of expected normal physiological development in childhood: Secondary | ICD-10-CM | POA: Diagnosis not present

## 2020-10-02 DIAGNOSIS — Z992 Dependence on renal dialysis: Secondary | ICD-10-CM | POA: Diagnosis not present

## 2020-10-02 DIAGNOSIS — I959 Hypotension, unspecified: Secondary | ICD-10-CM | POA: Diagnosis not present

## 2020-10-02 DIAGNOSIS — R55 Syncope and collapse: Secondary | ICD-10-CM | POA: Diagnosis not present

## 2020-10-02 DIAGNOSIS — N186 End stage renal disease: Secondary | ICD-10-CM | POA: Diagnosis not present

## 2020-10-03 DIAGNOSIS — Z9181 History of falling: Secondary | ICD-10-CM | POA: Diagnosis not present

## 2020-10-03 DIAGNOSIS — R55 Syncope and collapse: Secondary | ICD-10-CM | POA: Diagnosis not present

## 2020-10-03 DIAGNOSIS — N186 End stage renal disease: Secondary | ICD-10-CM | POA: Diagnosis not present

## 2020-10-03 DIAGNOSIS — R6250 Unspecified lack of expected normal physiological development in childhood: Secondary | ICD-10-CM | POA: Diagnosis not present

## 2020-10-03 DIAGNOSIS — Z992 Dependence on renal dialysis: Secondary | ICD-10-CM | POA: Diagnosis not present

## 2020-10-03 DIAGNOSIS — D649 Anemia, unspecified: Secondary | ICD-10-CM | POA: Diagnosis not present

## 2020-10-03 DIAGNOSIS — D631 Anemia in chronic kidney disease: Secondary | ICD-10-CM | POA: Diagnosis not present

## 2020-10-03 DIAGNOSIS — I959 Hypotension, unspecified: Secondary | ICD-10-CM | POA: Diagnosis not present

## 2020-10-04 DIAGNOSIS — N186 End stage renal disease: Secondary | ICD-10-CM | POA: Diagnosis not present

## 2020-10-04 DIAGNOSIS — I959 Hypotension, unspecified: Secondary | ICD-10-CM | POA: Diagnosis not present

## 2020-10-04 DIAGNOSIS — Z992 Dependence on renal dialysis: Secondary | ICD-10-CM | POA: Diagnosis not present

## 2020-10-04 DIAGNOSIS — D649 Anemia, unspecified: Secondary | ICD-10-CM | POA: Diagnosis not present

## 2020-10-04 DIAGNOSIS — R6250 Unspecified lack of expected normal physiological development in childhood: Secondary | ICD-10-CM | POA: Diagnosis not present

## 2020-10-04 DIAGNOSIS — Z9181 History of falling: Secondary | ICD-10-CM | POA: Diagnosis not present

## 2020-10-04 DIAGNOSIS — R55 Syncope and collapse: Secondary | ICD-10-CM | POA: Diagnosis not present

## 2020-10-04 DIAGNOSIS — D631 Anemia in chronic kidney disease: Secondary | ICD-10-CM | POA: Diagnosis not present

## 2020-10-05 DIAGNOSIS — Z9181 History of falling: Secondary | ICD-10-CM | POA: Diagnosis not present

## 2020-10-05 DIAGNOSIS — Z992 Dependence on renal dialysis: Secondary | ICD-10-CM | POA: Diagnosis not present

## 2020-10-05 DIAGNOSIS — D631 Anemia in chronic kidney disease: Secondary | ICD-10-CM | POA: Diagnosis not present

## 2020-10-05 DIAGNOSIS — R6250 Unspecified lack of expected normal physiological development in childhood: Secondary | ICD-10-CM | POA: Diagnosis not present

## 2020-10-05 DIAGNOSIS — N186 End stage renal disease: Secondary | ICD-10-CM | POA: Diagnosis not present

## 2020-10-05 DIAGNOSIS — I959 Hypotension, unspecified: Secondary | ICD-10-CM | POA: Diagnosis not present

## 2020-10-05 DIAGNOSIS — R55 Syncope and collapse: Secondary | ICD-10-CM | POA: Diagnosis not present

## 2020-10-06 DIAGNOSIS — I959 Hypotension, unspecified: Secondary | ICD-10-CM | POA: Diagnosis not present

## 2020-10-06 DIAGNOSIS — N186 End stage renal disease: Secondary | ICD-10-CM | POA: Diagnosis not present

## 2020-10-06 DIAGNOSIS — R6250 Unspecified lack of expected normal physiological development in childhood: Secondary | ICD-10-CM | POA: Diagnosis not present

## 2020-10-06 DIAGNOSIS — R55 Syncope and collapse: Secondary | ICD-10-CM | POA: Diagnosis not present

## 2020-10-06 DIAGNOSIS — D631 Anemia in chronic kidney disease: Secondary | ICD-10-CM | POA: Diagnosis not present

## 2020-10-06 DIAGNOSIS — Z992 Dependence on renal dialysis: Secondary | ICD-10-CM | POA: Diagnosis not present

## 2020-10-06 DIAGNOSIS — Z9181 History of falling: Secondary | ICD-10-CM | POA: Diagnosis not present

## 2020-10-07 DIAGNOSIS — Z9181 History of falling: Secondary | ICD-10-CM | POA: Diagnosis not present

## 2020-10-07 DIAGNOSIS — I959 Hypotension, unspecified: Secondary | ICD-10-CM | POA: Diagnosis not present

## 2020-10-07 DIAGNOSIS — Z992 Dependence on renal dialysis: Secondary | ICD-10-CM | POA: Diagnosis not present

## 2020-10-07 DIAGNOSIS — N186 End stage renal disease: Secondary | ICD-10-CM | POA: Diagnosis not present

## 2020-10-07 DIAGNOSIS — R1084 Generalized abdominal pain: Secondary | ICD-10-CM | POA: Diagnosis not present

## 2020-10-07 DIAGNOSIS — D631 Anemia in chronic kidney disease: Secondary | ICD-10-CM | POA: Diagnosis not present

## 2020-10-07 DIAGNOSIS — R6250 Unspecified lack of expected normal physiological development in childhood: Secondary | ICD-10-CM | POA: Diagnosis not present

## 2020-10-07 DIAGNOSIS — R55 Syncope and collapse: Secondary | ICD-10-CM | POA: Diagnosis not present

## 2020-10-08 DIAGNOSIS — Z9181 History of falling: Secondary | ICD-10-CM | POA: Diagnosis not present

## 2020-10-08 DIAGNOSIS — I959 Hypotension, unspecified: Secondary | ICD-10-CM | POA: Diagnosis not present

## 2020-10-08 DIAGNOSIS — R55 Syncope and collapse: Secondary | ICD-10-CM | POA: Diagnosis not present

## 2020-10-08 DIAGNOSIS — Z992 Dependence on renal dialysis: Secondary | ICD-10-CM | POA: Diagnosis not present

## 2020-10-08 DIAGNOSIS — R6250 Unspecified lack of expected normal physiological development in childhood: Secondary | ICD-10-CM | POA: Diagnosis not present

## 2020-10-08 DIAGNOSIS — N186 End stage renal disease: Secondary | ICD-10-CM | POA: Diagnosis not present

## 2020-10-08 DIAGNOSIS — D631 Anemia in chronic kidney disease: Secondary | ICD-10-CM | POA: Diagnosis not present

## 2020-10-09 DIAGNOSIS — Z992 Dependence on renal dialysis: Secondary | ICD-10-CM | POA: Diagnosis not present

## 2020-10-09 DIAGNOSIS — R55 Syncope and collapse: Secondary | ICD-10-CM | POA: Diagnosis not present

## 2020-10-09 DIAGNOSIS — D631 Anemia in chronic kidney disease: Secondary | ICD-10-CM | POA: Diagnosis not present

## 2020-10-09 DIAGNOSIS — I959 Hypotension, unspecified: Secondary | ICD-10-CM | POA: Diagnosis not present

## 2020-10-09 DIAGNOSIS — N186 End stage renal disease: Secondary | ICD-10-CM | POA: Diagnosis not present

## 2020-10-10 DIAGNOSIS — Z992 Dependence on renal dialysis: Secondary | ICD-10-CM | POA: Diagnosis not present

## 2020-10-10 DIAGNOSIS — D631 Anemia in chronic kidney disease: Secondary | ICD-10-CM | POA: Diagnosis not present

## 2020-10-10 DIAGNOSIS — R55 Syncope and collapse: Secondary | ICD-10-CM | POA: Diagnosis not present

## 2020-10-10 DIAGNOSIS — N186 End stage renal disease: Secondary | ICD-10-CM | POA: Diagnosis not present

## 2020-10-10 DIAGNOSIS — I959 Hypotension, unspecified: Secondary | ICD-10-CM | POA: Diagnosis not present

## 2020-10-11 DIAGNOSIS — Z992 Dependence on renal dialysis: Secondary | ICD-10-CM | POA: Diagnosis not present

## 2020-10-11 DIAGNOSIS — N186 End stage renal disease: Secondary | ICD-10-CM | POA: Diagnosis not present

## 2020-10-11 DIAGNOSIS — D631 Anemia in chronic kidney disease: Secondary | ICD-10-CM | POA: Diagnosis not present

## 2020-10-11 DIAGNOSIS — I959 Hypotension, unspecified: Secondary | ICD-10-CM | POA: Diagnosis not present

## 2020-10-11 DIAGNOSIS — R55 Syncope and collapse: Secondary | ICD-10-CM | POA: Diagnosis not present

## 2020-10-12 DIAGNOSIS — R55 Syncope and collapse: Secondary | ICD-10-CM | POA: Diagnosis not present

## 2020-10-12 DIAGNOSIS — I959 Hypotension, unspecified: Secondary | ICD-10-CM | POA: Diagnosis not present

## 2020-10-12 DIAGNOSIS — D631 Anemia in chronic kidney disease: Secondary | ICD-10-CM | POA: Diagnosis not present

## 2020-10-12 DIAGNOSIS — Z992 Dependence on renal dialysis: Secondary | ICD-10-CM | POA: Diagnosis not present

## 2020-10-12 DIAGNOSIS — N186 End stage renal disease: Secondary | ICD-10-CM | POA: Diagnosis not present

## 2020-10-13 DIAGNOSIS — R55 Syncope and collapse: Secondary | ICD-10-CM | POA: Diagnosis not present

## 2020-10-13 DIAGNOSIS — I959 Hypotension, unspecified: Secondary | ICD-10-CM | POA: Diagnosis not present

## 2020-10-13 DIAGNOSIS — N186 End stage renal disease: Secondary | ICD-10-CM | POA: Diagnosis not present

## 2020-10-13 DIAGNOSIS — D631 Anemia in chronic kidney disease: Secondary | ICD-10-CM | POA: Diagnosis not present

## 2020-10-13 DIAGNOSIS — Z992 Dependence on renal dialysis: Secondary | ICD-10-CM | POA: Diagnosis not present

## 2020-10-14 DIAGNOSIS — N186 End stage renal disease: Secondary | ICD-10-CM | POA: Diagnosis not present

## 2020-10-14 DIAGNOSIS — D631 Anemia in chronic kidney disease: Secondary | ICD-10-CM | POA: Diagnosis not present

## 2020-10-14 DIAGNOSIS — R55 Syncope and collapse: Secondary | ICD-10-CM | POA: Diagnosis not present

## 2020-10-14 DIAGNOSIS — Z992 Dependence on renal dialysis: Secondary | ICD-10-CM | POA: Diagnosis not present

## 2020-10-14 DIAGNOSIS — I959 Hypotension, unspecified: Secondary | ICD-10-CM | POA: Diagnosis not present

## 2020-10-15 DIAGNOSIS — I959 Hypotension, unspecified: Secondary | ICD-10-CM | POA: Diagnosis not present

## 2020-10-15 DIAGNOSIS — R55 Syncope and collapse: Secondary | ICD-10-CM | POA: Diagnosis not present

## 2020-10-15 DIAGNOSIS — D631 Anemia in chronic kidney disease: Secondary | ICD-10-CM | POA: Diagnosis not present

## 2020-10-15 DIAGNOSIS — Z992 Dependence on renal dialysis: Secondary | ICD-10-CM | POA: Diagnosis not present

## 2020-10-15 DIAGNOSIS — N186 End stage renal disease: Secondary | ICD-10-CM | POA: Diagnosis not present

## 2020-10-16 DIAGNOSIS — D631 Anemia in chronic kidney disease: Secondary | ICD-10-CM | POA: Diagnosis not present

## 2020-10-16 DIAGNOSIS — I959 Hypotension, unspecified: Secondary | ICD-10-CM | POA: Diagnosis not present

## 2020-10-16 DIAGNOSIS — R55 Syncope and collapse: Secondary | ICD-10-CM | POA: Diagnosis not present

## 2020-10-16 DIAGNOSIS — N186 End stage renal disease: Secondary | ICD-10-CM | POA: Diagnosis not present

## 2020-10-16 DIAGNOSIS — Z992 Dependence on renal dialysis: Secondary | ICD-10-CM | POA: Diagnosis not present

## 2020-10-17 DIAGNOSIS — Z992 Dependence on renal dialysis: Secondary | ICD-10-CM | POA: Diagnosis not present

## 2020-10-17 DIAGNOSIS — R55 Syncope and collapse: Secondary | ICD-10-CM | POA: Diagnosis not present

## 2020-10-17 DIAGNOSIS — D631 Anemia in chronic kidney disease: Secondary | ICD-10-CM | POA: Diagnosis not present

## 2020-10-17 DIAGNOSIS — N186 End stage renal disease: Secondary | ICD-10-CM | POA: Diagnosis not present

## 2020-10-17 DIAGNOSIS — I959 Hypotension, unspecified: Secondary | ICD-10-CM | POA: Diagnosis not present

## 2020-10-18 DIAGNOSIS — N186 End stage renal disease: Secondary | ICD-10-CM | POA: Diagnosis not present

## 2020-10-18 DIAGNOSIS — I959 Hypotension, unspecified: Secondary | ICD-10-CM | POA: Diagnosis not present

## 2020-10-18 DIAGNOSIS — Z992 Dependence on renal dialysis: Secondary | ICD-10-CM | POA: Diagnosis not present

## 2020-10-18 DIAGNOSIS — D631 Anemia in chronic kidney disease: Secondary | ICD-10-CM | POA: Diagnosis not present

## 2020-10-18 DIAGNOSIS — R55 Syncope and collapse: Secondary | ICD-10-CM | POA: Diagnosis not present

## 2020-10-19 DIAGNOSIS — R55 Syncope and collapse: Secondary | ICD-10-CM | POA: Diagnosis not present

## 2020-10-19 DIAGNOSIS — Z992 Dependence on renal dialysis: Secondary | ICD-10-CM | POA: Diagnosis not present

## 2020-10-19 DIAGNOSIS — I959 Hypotension, unspecified: Secondary | ICD-10-CM | POA: Diagnosis not present

## 2020-10-19 DIAGNOSIS — D631 Anemia in chronic kidney disease: Secondary | ICD-10-CM | POA: Diagnosis not present

## 2020-10-19 DIAGNOSIS — N186 End stage renal disease: Secondary | ICD-10-CM | POA: Diagnosis not present

## 2020-10-20 DIAGNOSIS — N186 End stage renal disease: Secondary | ICD-10-CM | POA: Diagnosis not present

## 2020-10-20 DIAGNOSIS — D631 Anemia in chronic kidney disease: Secondary | ICD-10-CM | POA: Diagnosis not present

## 2020-10-20 DIAGNOSIS — I959 Hypotension, unspecified: Secondary | ICD-10-CM | POA: Diagnosis not present

## 2020-10-20 DIAGNOSIS — R55 Syncope and collapse: Secondary | ICD-10-CM | POA: Diagnosis not present

## 2020-10-20 DIAGNOSIS — Z992 Dependence on renal dialysis: Secondary | ICD-10-CM | POA: Diagnosis not present

## 2020-10-21 DIAGNOSIS — Z992 Dependence on renal dialysis: Secondary | ICD-10-CM | POA: Diagnosis not present

## 2020-10-21 DIAGNOSIS — R404 Transient alteration of awareness: Secondary | ICD-10-CM | POA: Diagnosis not present

## 2020-10-21 DIAGNOSIS — I10 Essential (primary) hypertension: Secondary | ICD-10-CM | POA: Diagnosis not present

## 2020-10-21 DIAGNOSIS — R1310 Dysphagia, unspecified: Secondary | ICD-10-CM | POA: Diagnosis not present

## 2020-10-21 DIAGNOSIS — D631 Anemia in chronic kidney disease: Secondary | ICD-10-CM | POA: Diagnosis not present

## 2020-10-21 DIAGNOSIS — Z9181 History of falling: Secondary | ICD-10-CM | POA: Diagnosis not present

## 2020-10-21 DIAGNOSIS — K859 Acute pancreatitis without necrosis or infection, unspecified: Secondary | ICD-10-CM | POA: Diagnosis not present

## 2020-10-21 DIAGNOSIS — I959 Hypotension, unspecified: Secondary | ICD-10-CM | POA: Diagnosis not present

## 2020-10-21 DIAGNOSIS — Z79899 Other long term (current) drug therapy: Secondary | ICD-10-CM | POA: Diagnosis not present

## 2020-10-21 DIAGNOSIS — R6250 Unspecified lack of expected normal physiological development in childhood: Secondary | ICD-10-CM | POA: Diagnosis not present

## 2020-10-21 DIAGNOSIS — M6281 Muscle weakness (generalized): Secondary | ICD-10-CM | POA: Diagnosis not present

## 2020-10-21 DIAGNOSIS — Z7401 Bed confinement status: Secondary | ICD-10-CM | POA: Diagnosis not present

## 2020-10-21 DIAGNOSIS — K219 Gastro-esophageal reflux disease without esophagitis: Secondary | ICD-10-CM | POA: Diagnosis not present

## 2020-10-21 DIAGNOSIS — Z8674 Personal history of sudden cardiac arrest: Secondary | ICD-10-CM | POA: Diagnosis not present

## 2020-10-21 DIAGNOSIS — R109 Unspecified abdominal pain: Secondary | ICD-10-CM | POA: Diagnosis not present

## 2020-10-21 DIAGNOSIS — I951 Orthostatic hypotension: Secondary | ICD-10-CM | POA: Diagnosis not present

## 2020-10-21 DIAGNOSIS — N186 End stage renal disease: Secondary | ICD-10-CM | POA: Diagnosis not present

## 2020-10-21 DIAGNOSIS — R55 Syncope and collapse: Secondary | ICD-10-CM | POA: Diagnosis not present

## 2020-10-21 DIAGNOSIS — D649 Anemia, unspecified: Secondary | ICD-10-CM | POA: Diagnosis not present

## 2020-10-21 DIAGNOSIS — I499 Cardiac arrhythmia, unspecified: Secondary | ICD-10-CM | POA: Diagnosis not present

## 2020-10-22 DIAGNOSIS — N186 End stage renal disease: Secondary | ICD-10-CM | POA: Diagnosis not present

## 2020-10-22 DIAGNOSIS — I951 Orthostatic hypotension: Secondary | ICD-10-CM | POA: Diagnosis not present

## 2020-10-22 DIAGNOSIS — R1310 Dysphagia, unspecified: Secondary | ICD-10-CM | POA: Diagnosis not present

## 2020-10-22 DIAGNOSIS — R55 Syncope and collapse: Secondary | ICD-10-CM | POA: Diagnosis not present

## 2020-10-22 DIAGNOSIS — M6281 Muscle weakness (generalized): Secondary | ICD-10-CM | POA: Diagnosis not present

## 2020-10-23 DIAGNOSIS — I951 Orthostatic hypotension: Secondary | ICD-10-CM | POA: Diagnosis not present

## 2020-10-23 DIAGNOSIS — Z992 Dependence on renal dialysis: Secondary | ICD-10-CM | POA: Diagnosis not present

## 2020-10-23 DIAGNOSIS — M6281 Muscle weakness (generalized): Secondary | ICD-10-CM | POA: Diagnosis not present

## 2020-10-23 DIAGNOSIS — R6250 Unspecified lack of expected normal physiological development in childhood: Secondary | ICD-10-CM | POA: Diagnosis not present

## 2020-10-23 DIAGNOSIS — N186 End stage renal disease: Secondary | ICD-10-CM | POA: Diagnosis not present

## 2020-10-23 DIAGNOSIS — Z79899 Other long term (current) drug therapy: Secondary | ICD-10-CM | POA: Diagnosis not present

## 2020-10-23 DIAGNOSIS — R1310 Dysphagia, unspecified: Secondary | ICD-10-CM | POA: Diagnosis not present

## 2020-10-23 DIAGNOSIS — R55 Syncope and collapse: Secondary | ICD-10-CM | POA: Diagnosis not present

## 2020-10-23 DIAGNOSIS — D649 Anemia, unspecified: Secondary | ICD-10-CM | POA: Diagnosis not present

## 2020-10-24 DIAGNOSIS — I951 Orthostatic hypotension: Secondary | ICD-10-CM | POA: Diagnosis not present

## 2020-10-24 DIAGNOSIS — R55 Syncope and collapse: Secondary | ICD-10-CM | POA: Diagnosis not present

## 2020-10-24 DIAGNOSIS — N186 End stage renal disease: Secondary | ICD-10-CM | POA: Diagnosis not present

## 2020-10-24 DIAGNOSIS — R1310 Dysphagia, unspecified: Secondary | ICD-10-CM | POA: Diagnosis not present

## 2020-10-24 DIAGNOSIS — M6281 Muscle weakness (generalized): Secondary | ICD-10-CM | POA: Diagnosis not present

## 2020-10-25 DIAGNOSIS — Z992 Dependence on renal dialysis: Secondary | ICD-10-CM | POA: Diagnosis not present

## 2020-10-25 DIAGNOSIS — N186 End stage renal disease: Secondary | ICD-10-CM | POA: Diagnosis not present

## 2020-10-27 DIAGNOSIS — I10 Essential (primary) hypertension: Secondary | ICD-10-CM | POA: Diagnosis not present

## 2020-10-27 DIAGNOSIS — N186 End stage renal disease: Secondary | ICD-10-CM | POA: Diagnosis not present

## 2020-10-27 DIAGNOSIS — M6281 Muscle weakness (generalized): Secondary | ICD-10-CM | POA: Diagnosis not present

## 2020-10-27 DIAGNOSIS — I951 Orthostatic hypotension: Secondary | ICD-10-CM | POA: Diagnosis not present

## 2020-10-28 DIAGNOSIS — Z992 Dependence on renal dialysis: Secondary | ICD-10-CM | POA: Diagnosis not present

## 2020-10-28 DIAGNOSIS — T82868A Thrombosis of vascular prosthetic devices, implants and grafts, initial encounter: Secondary | ICD-10-CM | POA: Diagnosis not present

## 2020-10-28 DIAGNOSIS — N186 End stage renal disease: Secondary | ICD-10-CM | POA: Diagnosis not present

## 2020-10-28 DIAGNOSIS — I951 Orthostatic hypotension: Secondary | ICD-10-CM | POA: Diagnosis not present

## 2020-10-28 DIAGNOSIS — N2581 Secondary hyperparathyroidism of renal origin: Secondary | ICD-10-CM | POA: Diagnosis not present

## 2020-10-28 DIAGNOSIS — M6281 Muscle weakness (generalized): Secondary | ICD-10-CM | POA: Diagnosis not present

## 2020-10-29 DIAGNOSIS — D649 Anemia, unspecified: Secondary | ICD-10-CM | POA: Diagnosis not present

## 2020-10-29 DIAGNOSIS — Z992 Dependence on renal dialysis: Secondary | ICD-10-CM | POA: Diagnosis not present

## 2020-10-29 DIAGNOSIS — N186 End stage renal disease: Secondary | ICD-10-CM | POA: Diagnosis not present

## 2020-10-29 DIAGNOSIS — R6250 Unspecified lack of expected normal physiological development in childhood: Secondary | ICD-10-CM | POA: Diagnosis not present

## 2020-10-29 DIAGNOSIS — M6281 Muscle weakness (generalized): Secondary | ICD-10-CM | POA: Diagnosis not present

## 2020-10-29 DIAGNOSIS — I951 Orthostatic hypotension: Secondary | ICD-10-CM | POA: Diagnosis not present

## 2020-10-30 DIAGNOSIS — N186 End stage renal disease: Secondary | ICD-10-CM | POA: Diagnosis not present

## 2020-10-30 DIAGNOSIS — T82868A Thrombosis of vascular prosthetic devices, implants and grafts, initial encounter: Secondary | ICD-10-CM | POA: Diagnosis not present

## 2020-10-30 DIAGNOSIS — Z992 Dependence on renal dialysis: Secondary | ICD-10-CM | POA: Diagnosis not present

## 2020-10-30 DIAGNOSIS — M6281 Muscle weakness (generalized): Secondary | ICD-10-CM | POA: Diagnosis not present

## 2020-10-30 DIAGNOSIS — I951 Orthostatic hypotension: Secondary | ICD-10-CM | POA: Diagnosis not present

## 2020-10-31 DIAGNOSIS — M6281 Muscle weakness (generalized): Secondary | ICD-10-CM | POA: Diagnosis not present

## 2020-10-31 DIAGNOSIS — N186 End stage renal disease: Secondary | ICD-10-CM | POA: Diagnosis not present

## 2020-10-31 DIAGNOSIS — I951 Orthostatic hypotension: Secondary | ICD-10-CM | POA: Diagnosis not present

## 2020-11-01 DIAGNOSIS — Z992 Dependence on renal dialysis: Secondary | ICD-10-CM | POA: Diagnosis not present

## 2020-11-01 DIAGNOSIS — M6281 Muscle weakness (generalized): Secondary | ICD-10-CM | POA: Diagnosis not present

## 2020-11-01 DIAGNOSIS — T82868A Thrombosis of vascular prosthetic devices, implants and grafts, initial encounter: Secondary | ICD-10-CM | POA: Diagnosis not present

## 2020-11-01 DIAGNOSIS — Z043 Encounter for examination and observation following other accident: Secondary | ICD-10-CM | POA: Diagnosis not present

## 2020-11-01 DIAGNOSIS — I951 Orthostatic hypotension: Secondary | ICD-10-CM | POA: Diagnosis not present

## 2020-11-01 DIAGNOSIS — N186 End stage renal disease: Secondary | ICD-10-CM | POA: Diagnosis not present

## 2020-11-03 DIAGNOSIS — N186 End stage renal disease: Secondary | ICD-10-CM | POA: Diagnosis not present

## 2020-11-03 DIAGNOSIS — I951 Orthostatic hypotension: Secondary | ICD-10-CM | POA: Diagnosis not present

## 2020-11-03 DIAGNOSIS — M6281 Muscle weakness (generalized): Secondary | ICD-10-CM | POA: Diagnosis not present

## 2020-11-04 DIAGNOSIS — N186 End stage renal disease: Secondary | ICD-10-CM | POA: Diagnosis not present

## 2020-11-04 DIAGNOSIS — T82868A Thrombosis of vascular prosthetic devices, implants and grafts, initial encounter: Secondary | ICD-10-CM | POA: Diagnosis not present

## 2020-11-04 DIAGNOSIS — N2581 Secondary hyperparathyroidism of renal origin: Secondary | ICD-10-CM | POA: Diagnosis not present

## 2020-11-04 DIAGNOSIS — I951 Orthostatic hypotension: Secondary | ICD-10-CM | POA: Diagnosis not present

## 2020-11-04 DIAGNOSIS — M6281 Muscle weakness (generalized): Secondary | ICD-10-CM | POA: Diagnosis not present

## 2020-11-04 DIAGNOSIS — Z992 Dependence on renal dialysis: Secondary | ICD-10-CM | POA: Diagnosis not present

## 2020-11-05 DIAGNOSIS — I951 Orthostatic hypotension: Secondary | ICD-10-CM | POA: Diagnosis not present

## 2020-11-05 DIAGNOSIS — M6281 Muscle weakness (generalized): Secondary | ICD-10-CM | POA: Diagnosis not present

## 2020-11-05 DIAGNOSIS — N186 End stage renal disease: Secondary | ICD-10-CM | POA: Diagnosis not present

## 2020-11-06 DIAGNOSIS — Z992 Dependence on renal dialysis: Secondary | ICD-10-CM | POA: Diagnosis not present

## 2020-11-06 DIAGNOSIS — T82868A Thrombosis of vascular prosthetic devices, implants and grafts, initial encounter: Secondary | ICD-10-CM | POA: Diagnosis not present

## 2020-11-06 DIAGNOSIS — N186 End stage renal disease: Secondary | ICD-10-CM | POA: Diagnosis not present

## 2020-11-06 DIAGNOSIS — M6281 Muscle weakness (generalized): Secondary | ICD-10-CM | POA: Diagnosis not present

## 2020-11-06 DIAGNOSIS — I951 Orthostatic hypotension: Secondary | ICD-10-CM | POA: Diagnosis not present

## 2020-11-07 DIAGNOSIS — T8249XA Other complication of vascular dialysis catheter, initial encounter: Secondary | ICD-10-CM | POA: Diagnosis not present

## 2020-11-07 DIAGNOSIS — Z992 Dependence on renal dialysis: Secondary | ICD-10-CM | POA: Diagnosis not present

## 2020-11-07 DIAGNOSIS — N186 End stage renal disease: Secondary | ICD-10-CM | POA: Diagnosis not present

## 2020-11-07 DIAGNOSIS — T82868A Thrombosis of vascular prosthetic devices, implants and grafts, initial encounter: Secondary | ICD-10-CM | POA: Diagnosis not present

## 2020-11-08 DIAGNOSIS — Z992 Dependence on renal dialysis: Secondary | ICD-10-CM | POA: Diagnosis not present

## 2020-11-08 DIAGNOSIS — T82868A Thrombosis of vascular prosthetic devices, implants and grafts, initial encounter: Secondary | ICD-10-CM | POA: Diagnosis not present

## 2020-11-08 DIAGNOSIS — N186 End stage renal disease: Secondary | ICD-10-CM | POA: Diagnosis not present

## 2020-11-10 DIAGNOSIS — I951 Orthostatic hypotension: Secondary | ICD-10-CM | POA: Diagnosis not present

## 2020-11-10 DIAGNOSIS — M6281 Muscle weakness (generalized): Secondary | ICD-10-CM | POA: Diagnosis not present

## 2020-11-10 DIAGNOSIS — N186 End stage renal disease: Secondary | ICD-10-CM | POA: Diagnosis not present

## 2020-11-11 DIAGNOSIS — I951 Orthostatic hypotension: Secondary | ICD-10-CM | POA: Diagnosis not present

## 2020-11-11 DIAGNOSIS — M6281 Muscle weakness (generalized): Secondary | ICD-10-CM | POA: Diagnosis not present

## 2020-11-11 DIAGNOSIS — Z992 Dependence on renal dialysis: Secondary | ICD-10-CM | POA: Diagnosis not present

## 2020-11-11 DIAGNOSIS — T82868A Thrombosis of vascular prosthetic devices, implants and grafts, initial encounter: Secondary | ICD-10-CM | POA: Diagnosis not present

## 2020-11-11 DIAGNOSIS — N186 End stage renal disease: Secondary | ICD-10-CM | POA: Diagnosis not present

## 2020-11-12 DIAGNOSIS — M6281 Muscle weakness (generalized): Secondary | ICD-10-CM | POA: Diagnosis not present

## 2020-11-12 DIAGNOSIS — N186 End stage renal disease: Secondary | ICD-10-CM | POA: Diagnosis not present

## 2020-11-12 DIAGNOSIS — I951 Orthostatic hypotension: Secondary | ICD-10-CM | POA: Diagnosis not present

## 2020-11-13 DIAGNOSIS — Z992 Dependence on renal dialysis: Secondary | ICD-10-CM | POA: Diagnosis not present

## 2020-11-13 DIAGNOSIS — M6281 Muscle weakness (generalized): Secondary | ICD-10-CM | POA: Diagnosis not present

## 2020-11-13 DIAGNOSIS — N186 End stage renal disease: Secondary | ICD-10-CM | POA: Diagnosis not present

## 2020-11-13 DIAGNOSIS — I951 Orthostatic hypotension: Secondary | ICD-10-CM | POA: Diagnosis not present

## 2020-11-13 DIAGNOSIS — T82868A Thrombosis of vascular prosthetic devices, implants and grafts, initial encounter: Secondary | ICD-10-CM | POA: Diagnosis not present

## 2020-11-14 DIAGNOSIS — R5381 Other malaise: Secondary | ICD-10-CM | POA: Diagnosis not present

## 2020-11-14 DIAGNOSIS — Z593 Problems related to living in residential institution: Secondary | ICD-10-CM | POA: Diagnosis not present

## 2020-11-14 DIAGNOSIS — R52 Pain, unspecified: Secondary | ICD-10-CM | POA: Diagnosis not present

## 2020-11-14 DIAGNOSIS — K8689 Other specified diseases of pancreas: Secondary | ICD-10-CM | POA: Diagnosis not present

## 2020-11-14 DIAGNOSIS — Z743 Need for continuous supervision: Secondary | ICD-10-CM | POA: Diagnosis not present

## 2020-11-14 DIAGNOSIS — I951 Orthostatic hypotension: Secondary | ICD-10-CM | POA: Diagnosis not present

## 2020-11-14 DIAGNOSIS — M6281 Muscle weakness (generalized): Secondary | ICD-10-CM | POA: Diagnosis not present

## 2020-11-14 DIAGNOSIS — I120 Hypertensive chronic kidney disease with stage 5 chronic kidney disease or end stage renal disease: Secondary | ICD-10-CM | POA: Diagnosis not present

## 2020-11-14 DIAGNOSIS — N186 End stage renal disease: Secondary | ICD-10-CM | POA: Diagnosis not present

## 2020-11-14 DIAGNOSIS — R404 Transient alteration of awareness: Secondary | ICD-10-CM | POA: Diagnosis not present

## 2020-11-14 DIAGNOSIS — K863 Pseudocyst of pancreas: Secondary | ICD-10-CM | POA: Diagnosis not present

## 2020-11-14 DIAGNOSIS — R1084 Generalized abdominal pain: Secondary | ICD-10-CM | POA: Diagnosis not present

## 2020-11-14 DIAGNOSIS — K5900 Constipation, unspecified: Secondary | ICD-10-CM | POA: Diagnosis not present

## 2020-11-15 DIAGNOSIS — T82868A Thrombosis of vascular prosthetic devices, implants and grafts, initial encounter: Secondary | ICD-10-CM | POA: Diagnosis not present

## 2020-11-15 DIAGNOSIS — N186 End stage renal disease: Secondary | ICD-10-CM | POA: Diagnosis not present

## 2020-11-15 DIAGNOSIS — Z743 Need for continuous supervision: Secondary | ICD-10-CM | POA: Diagnosis not present

## 2020-11-15 DIAGNOSIS — R52 Pain, unspecified: Secondary | ICD-10-CM | POA: Diagnosis not present

## 2020-11-15 DIAGNOSIS — R404 Transient alteration of awareness: Secondary | ICD-10-CM | POA: Diagnosis not present

## 2020-11-15 DIAGNOSIS — Z992 Dependence on renal dialysis: Secondary | ICD-10-CM | POA: Diagnosis not present

## 2020-11-15 DIAGNOSIS — I499 Cardiac arrhythmia, unspecified: Secondary | ICD-10-CM | POA: Diagnosis not present

## 2020-11-15 DIAGNOSIS — Z7401 Bed confinement status: Secondary | ICD-10-CM | POA: Diagnosis not present

## 2020-11-16 DIAGNOSIS — R9431 Abnormal electrocardiogram [ECG] [EKG]: Secondary | ICD-10-CM | POA: Diagnosis not present

## 2020-11-16 DIAGNOSIS — I120 Hypertensive chronic kidney disease with stage 5 chronic kidney disease or end stage renal disease: Secondary | ICD-10-CM | POA: Diagnosis not present

## 2020-11-16 DIAGNOSIS — R404 Transient alteration of awareness: Secondary | ICD-10-CM | POA: Diagnosis not present

## 2020-11-16 DIAGNOSIS — R000 Tachycardia, unspecified: Secondary | ICD-10-CM | POA: Diagnosis not present

## 2020-11-16 DIAGNOSIS — Z79899 Other long term (current) drug therapy: Secondary | ICD-10-CM | POA: Diagnosis not present

## 2020-11-16 DIAGNOSIS — R079 Chest pain, unspecified: Secondary | ICD-10-CM | POA: Diagnosis not present

## 2020-11-16 DIAGNOSIS — R109 Unspecified abdominal pain: Secondary | ICD-10-CM | POA: Diagnosis not present

## 2020-11-16 DIAGNOSIS — Z743 Need for continuous supervision: Secondary | ICD-10-CM | POA: Diagnosis not present

## 2020-11-16 DIAGNOSIS — Z593 Problems related to living in residential institution: Secondary | ICD-10-CM | POA: Diagnosis not present

## 2020-11-16 DIAGNOSIS — I499 Cardiac arrhythmia, unspecified: Secondary | ICD-10-CM | POA: Diagnosis not present

## 2020-11-16 DIAGNOSIS — R1030 Lower abdominal pain, unspecified: Secondary | ICD-10-CM | POA: Diagnosis not present

## 2020-11-16 DIAGNOSIS — N186 End stage renal disease: Secondary | ICD-10-CM | POA: Diagnosis not present

## 2020-11-16 DIAGNOSIS — Z992 Dependence on renal dialysis: Secondary | ICD-10-CM | POA: Diagnosis not present

## 2020-11-17 DIAGNOSIS — S3993XA Unspecified injury of pelvis, initial encounter: Secondary | ICD-10-CM | POA: Diagnosis not present

## 2020-11-17 DIAGNOSIS — R000 Tachycardia, unspecified: Secondary | ICD-10-CM | POA: Diagnosis not present

## 2020-11-17 DIAGNOSIS — B9689 Other specified bacterial agents as the cause of diseases classified elsewhere: Secondary | ICD-10-CM | POA: Diagnosis not present

## 2020-11-17 DIAGNOSIS — I120 Hypertensive chronic kidney disease with stage 5 chronic kidney disease or end stage renal disease: Secondary | ICD-10-CM | POA: Diagnosis not present

## 2020-11-17 DIAGNOSIS — Z992 Dependence on renal dialysis: Secondary | ICD-10-CM | POA: Diagnosis not present

## 2020-11-17 DIAGNOSIS — Z049 Encounter for examination and observation for unspecified reason: Secondary | ICD-10-CM | POA: Diagnosis not present

## 2020-11-17 DIAGNOSIS — R918 Other nonspecific abnormal finding of lung field: Secondary | ICD-10-CM | POA: Diagnosis not present

## 2020-11-17 DIAGNOSIS — I281 Aneurysm of pulmonary artery: Secondary | ICD-10-CM | POA: Diagnosis not present

## 2020-11-17 DIAGNOSIS — K863 Pseudocyst of pancreas: Secondary | ICD-10-CM | POA: Diagnosis not present

## 2020-11-17 DIAGNOSIS — Z79899 Other long term (current) drug therapy: Secondary | ICD-10-CM | POA: Diagnosis not present

## 2020-11-17 DIAGNOSIS — Z880 Allergy status to penicillin: Secondary | ICD-10-CM | POA: Diagnosis not present

## 2020-11-17 DIAGNOSIS — E860 Dehydration: Secondary | ICD-10-CM | POA: Diagnosis not present

## 2020-11-17 DIAGNOSIS — D631 Anemia in chronic kidney disease: Secondary | ICD-10-CM | POA: Diagnosis not present

## 2020-11-17 DIAGNOSIS — R791 Abnormal coagulation profile: Secondary | ICD-10-CM | POA: Diagnosis not present

## 2020-11-17 DIAGNOSIS — F32A Depression, unspecified: Secondary | ICD-10-CM | POA: Diagnosis not present

## 2020-11-17 DIAGNOSIS — N2581 Secondary hyperparathyroidism of renal origin: Secondary | ICD-10-CM | POA: Diagnosis not present

## 2020-11-17 DIAGNOSIS — N186 End stage renal disease: Secondary | ICD-10-CM | POA: Diagnosis not present

## 2020-11-17 DIAGNOSIS — K219 Gastro-esophageal reflux disease without esophagitis: Secondary | ICD-10-CM | POA: Diagnosis not present

## 2020-11-17 DIAGNOSIS — Z882 Allergy status to sulfonamides status: Secondary | ICD-10-CM | POA: Diagnosis not present

## 2020-11-18 DIAGNOSIS — Z79899 Other long term (current) drug therapy: Secondary | ICD-10-CM | POA: Diagnosis not present

## 2020-11-18 DIAGNOSIS — N760 Acute vaginitis: Secondary | ICD-10-CM | POA: Insufficient documentation

## 2020-11-18 DIAGNOSIS — K219 Gastro-esophageal reflux disease without esophagitis: Secondary | ICD-10-CM | POA: Diagnosis not present

## 2020-11-18 DIAGNOSIS — N186 End stage renal disease: Secondary | ICD-10-CM | POA: Diagnosis not present

## 2020-11-18 DIAGNOSIS — B9689 Other specified bacterial agents as the cause of diseases classified elsewhere: Secondary | ICD-10-CM | POA: Insufficient documentation

## 2020-11-18 DIAGNOSIS — R000 Tachycardia, unspecified: Secondary | ICD-10-CM | POA: Diagnosis not present

## 2020-11-18 DIAGNOSIS — Z992 Dependence on renal dialysis: Secondary | ICD-10-CM | POA: Diagnosis not present

## 2020-11-18 DIAGNOSIS — Z882 Allergy status to sulfonamides status: Secondary | ICD-10-CM | POA: Diagnosis not present

## 2020-11-18 DIAGNOSIS — Z880 Allergy status to penicillin: Secondary | ICD-10-CM | POA: Diagnosis not present

## 2020-11-18 DIAGNOSIS — S3993XA Unspecified injury of pelvis, initial encounter: Secondary | ICD-10-CM | POA: Diagnosis not present

## 2020-11-18 DIAGNOSIS — F32A Depression, unspecified: Secondary | ICD-10-CM | POA: Diagnosis not present

## 2020-11-18 DIAGNOSIS — D631 Anemia in chronic kidney disease: Secondary | ICD-10-CM | POA: Diagnosis not present

## 2020-11-18 DIAGNOSIS — K863 Pseudocyst of pancreas: Secondary | ICD-10-CM | POA: Diagnosis not present

## 2020-11-18 DIAGNOSIS — E860 Dehydration: Secondary | ICD-10-CM | POA: Diagnosis not present

## 2020-11-18 DIAGNOSIS — N2581 Secondary hyperparathyroidism of renal origin: Secondary | ICD-10-CM | POA: Diagnosis not present

## 2020-11-18 DIAGNOSIS — I120 Hypertensive chronic kidney disease with stage 5 chronic kidney disease or end stage renal disease: Secondary | ICD-10-CM | POA: Diagnosis not present

## 2020-11-19 DIAGNOSIS — D631 Anemia in chronic kidney disease: Secondary | ICD-10-CM | POA: Diagnosis not present

## 2020-11-19 DIAGNOSIS — K219 Gastro-esophageal reflux disease without esophagitis: Secondary | ICD-10-CM | POA: Diagnosis not present

## 2020-11-19 DIAGNOSIS — F32A Depression, unspecified: Secondary | ICD-10-CM | POA: Diagnosis not present

## 2020-11-19 DIAGNOSIS — R5381 Other malaise: Secondary | ICD-10-CM | POA: Diagnosis not present

## 2020-11-19 DIAGNOSIS — E860 Dehydration: Secondary | ICD-10-CM | POA: Diagnosis not present

## 2020-11-19 DIAGNOSIS — N2581 Secondary hyperparathyroidism of renal origin: Secondary | ICD-10-CM | POA: Diagnosis not present

## 2020-11-19 DIAGNOSIS — S3993XA Unspecified injury of pelvis, initial encounter: Secondary | ICD-10-CM | POA: Diagnosis not present

## 2020-11-19 DIAGNOSIS — Z992 Dependence on renal dialysis: Secondary | ICD-10-CM | POA: Diagnosis not present

## 2020-11-19 DIAGNOSIS — Z882 Allergy status to sulfonamides status: Secondary | ICD-10-CM | POA: Diagnosis not present

## 2020-11-19 DIAGNOSIS — B9689 Other specified bacterial agents as the cause of diseases classified elsewhere: Secondary | ICD-10-CM | POA: Diagnosis not present

## 2020-11-19 DIAGNOSIS — I120 Hypertensive chronic kidney disease with stage 5 chronic kidney disease or end stage renal disease: Secondary | ICD-10-CM | POA: Diagnosis not present

## 2020-11-19 DIAGNOSIS — R279 Unspecified lack of coordination: Secondary | ICD-10-CM | POA: Diagnosis not present

## 2020-11-19 DIAGNOSIS — N186 End stage renal disease: Secondary | ICD-10-CM | POA: Diagnosis not present

## 2020-11-19 DIAGNOSIS — Z880 Allergy status to penicillin: Secondary | ICD-10-CM | POA: Diagnosis not present

## 2020-11-19 DIAGNOSIS — Z79899 Other long term (current) drug therapy: Secondary | ICD-10-CM | POA: Diagnosis not present

## 2020-11-19 DIAGNOSIS — M6281 Muscle weakness (generalized): Secondary | ICD-10-CM | POA: Diagnosis not present

## 2020-11-19 DIAGNOSIS — R000 Tachycardia, unspecified: Secondary | ICD-10-CM | POA: Diagnosis not present

## 2020-11-19 DIAGNOSIS — K863 Pseudocyst of pancreas: Secondary | ICD-10-CM | POA: Diagnosis not present

## 2020-11-20 DIAGNOSIS — Z992 Dependence on renal dialysis: Secondary | ICD-10-CM | POA: Diagnosis not present

## 2020-11-20 DIAGNOSIS — N186 End stage renal disease: Secondary | ICD-10-CM | POA: Diagnosis not present

## 2020-11-20 DIAGNOSIS — T82868A Thrombosis of vascular prosthetic devices, implants and grafts, initial encounter: Secondary | ICD-10-CM | POA: Diagnosis not present

## 2020-11-20 DIAGNOSIS — I951 Orthostatic hypotension: Secondary | ICD-10-CM | POA: Diagnosis not present

## 2020-11-20 DIAGNOSIS — M6281 Muscle weakness (generalized): Secondary | ICD-10-CM | POA: Diagnosis not present

## 2020-11-21 DIAGNOSIS — N186 End stage renal disease: Secondary | ICD-10-CM | POA: Diagnosis not present

## 2020-11-21 DIAGNOSIS — I951 Orthostatic hypotension: Secondary | ICD-10-CM | POA: Diagnosis not present

## 2020-11-21 DIAGNOSIS — M6281 Muscle weakness (generalized): Secondary | ICD-10-CM | POA: Diagnosis not present

## 2020-11-22 DIAGNOSIS — I951 Orthostatic hypotension: Secondary | ICD-10-CM | POA: Diagnosis not present

## 2020-11-22 DIAGNOSIS — N186 End stage renal disease: Secondary | ICD-10-CM | POA: Diagnosis not present

## 2020-11-22 DIAGNOSIS — M6281 Muscle weakness (generalized): Secondary | ICD-10-CM | POA: Diagnosis not present

## 2020-11-22 DIAGNOSIS — T82868A Thrombosis of vascular prosthetic devices, implants and grafts, initial encounter: Secondary | ICD-10-CM | POA: Diagnosis not present

## 2020-11-22 DIAGNOSIS — Z992 Dependence on renal dialysis: Secondary | ICD-10-CM | POA: Diagnosis not present

## 2020-11-24 DIAGNOSIS — B9689 Other specified bacterial agents as the cause of diseases classified elsewhere: Secondary | ICD-10-CM | POA: Diagnosis not present

## 2020-11-24 DIAGNOSIS — D649 Anemia, unspecified: Secondary | ICD-10-CM | POA: Diagnosis not present

## 2020-11-24 DIAGNOSIS — M6281 Muscle weakness (generalized): Secondary | ICD-10-CM | POA: Diagnosis not present

## 2020-11-24 DIAGNOSIS — R109 Unspecified abdominal pain: Secondary | ICD-10-CM | POA: Diagnosis not present

## 2020-11-24 DIAGNOSIS — N186 End stage renal disease: Secondary | ICD-10-CM | POA: Diagnosis not present

## 2020-11-24 DIAGNOSIS — I951 Orthostatic hypotension: Secondary | ICD-10-CM | POA: Diagnosis not present

## 2020-11-25 DIAGNOSIS — Z992 Dependence on renal dialysis: Secondary | ICD-10-CM | POA: Diagnosis not present

## 2020-11-25 DIAGNOSIS — I951 Orthostatic hypotension: Secondary | ICD-10-CM | POA: Diagnosis not present

## 2020-11-25 DIAGNOSIS — T82868A Thrombosis of vascular prosthetic devices, implants and grafts, initial encounter: Secondary | ICD-10-CM | POA: Diagnosis not present

## 2020-11-25 DIAGNOSIS — M6281 Muscle weakness (generalized): Secondary | ICD-10-CM | POA: Diagnosis not present

## 2020-11-25 DIAGNOSIS — N186 End stage renal disease: Secondary | ICD-10-CM | POA: Diagnosis not present

## 2020-11-26 DIAGNOSIS — T82858A Stenosis of vascular prosthetic devices, implants and grafts, initial encounter: Secondary | ICD-10-CM | POA: Diagnosis not present

## 2020-11-26 DIAGNOSIS — N186 End stage renal disease: Secondary | ICD-10-CM | POA: Diagnosis not present

## 2020-11-26 DIAGNOSIS — Z992 Dependence on renal dialysis: Secondary | ICD-10-CM | POA: Diagnosis not present

## 2020-11-27 DIAGNOSIS — M6281 Muscle weakness (generalized): Secondary | ICD-10-CM | POA: Diagnosis not present

## 2020-11-27 DIAGNOSIS — N186 End stage renal disease: Secondary | ICD-10-CM | POA: Diagnosis not present

## 2020-11-27 DIAGNOSIS — Z992 Dependence on renal dialysis: Secondary | ICD-10-CM | POA: Diagnosis not present

## 2020-11-27 DIAGNOSIS — I951 Orthostatic hypotension: Secondary | ICD-10-CM | POA: Diagnosis not present

## 2020-11-28 DIAGNOSIS — N186 End stage renal disease: Secondary | ICD-10-CM | POA: Diagnosis not present

## 2020-11-28 DIAGNOSIS — M6281 Muscle weakness (generalized): Secondary | ICD-10-CM | POA: Diagnosis not present

## 2020-11-28 DIAGNOSIS — I951 Orthostatic hypotension: Secondary | ICD-10-CM | POA: Diagnosis not present

## 2020-11-29 DIAGNOSIS — N186 End stage renal disease: Secondary | ICD-10-CM | POA: Diagnosis not present

## 2020-11-29 DIAGNOSIS — Z992 Dependence on renal dialysis: Secondary | ICD-10-CM | POA: Diagnosis not present

## 2020-12-01 DIAGNOSIS — N186 End stage renal disease: Secondary | ICD-10-CM | POA: Diagnosis not present

## 2020-12-01 DIAGNOSIS — I951 Orthostatic hypotension: Secondary | ICD-10-CM | POA: Diagnosis not present

## 2020-12-01 DIAGNOSIS — M6281 Muscle weakness (generalized): Secondary | ICD-10-CM | POA: Diagnosis not present

## 2020-12-01 DIAGNOSIS — I10 Essential (primary) hypertension: Secondary | ICD-10-CM | POA: Diagnosis not present

## 2020-12-01 DIAGNOSIS — Z992 Dependence on renal dialysis: Secondary | ICD-10-CM | POA: Diagnosis not present

## 2020-12-02 DIAGNOSIS — N186 End stage renal disease: Secondary | ICD-10-CM | POA: Diagnosis not present

## 2020-12-02 DIAGNOSIS — Z043 Encounter for examination and observation following other accident: Secondary | ICD-10-CM | POA: Diagnosis not present

## 2020-12-02 DIAGNOSIS — Z992 Dependence on renal dialysis: Secondary | ICD-10-CM | POA: Diagnosis not present

## 2020-12-02 DIAGNOSIS — I951 Orthostatic hypotension: Secondary | ICD-10-CM | POA: Diagnosis not present

## 2020-12-02 DIAGNOSIS — R1084 Generalized abdominal pain: Secondary | ICD-10-CM | POA: Diagnosis not present

## 2020-12-02 DIAGNOSIS — M6281 Muscle weakness (generalized): Secondary | ICD-10-CM | POA: Diagnosis not present

## 2020-12-04 ENCOUNTER — Other Ambulatory Visit (HOSPITAL_COMMUNITY): Payer: Self-pay | Admitting: Medical

## 2020-12-04 ENCOUNTER — Other Ambulatory Visit (HOSPITAL_COMMUNITY): Payer: Self-pay | Admitting: Nephrology

## 2020-12-04 ENCOUNTER — Other Ambulatory Visit: Payer: Self-pay | Admitting: Student

## 2020-12-04 DIAGNOSIS — N186 End stage renal disease: Secondary | ICD-10-CM

## 2020-12-04 DIAGNOSIS — Z992 Dependence on renal dialysis: Secondary | ICD-10-CM | POA: Diagnosis not present

## 2020-12-05 ENCOUNTER — Other Ambulatory Visit (HOSPITAL_COMMUNITY): Payer: Self-pay | Admitting: Medical

## 2020-12-05 ENCOUNTER — Ambulatory Visit (HOSPITAL_COMMUNITY)
Admission: RE | Admit: 2020-12-05 | Discharge: 2020-12-05 | Disposition: A | Discharge status: Home / Self Care | Payer: Medicare Other | Source: Ambulatory Visit | Attending: Medical | Admitting: Medical

## 2020-12-05 ENCOUNTER — Other Ambulatory Visit: Payer: Self-pay

## 2020-12-05 DIAGNOSIS — N186 End stage renal disease: Secondary | ICD-10-CM | POA: Diagnosis not present

## 2020-12-05 DIAGNOSIS — T82828A Fibrosis of vascular prosthetic devices, implants and grafts, initial encounter: Secondary | ICD-10-CM | POA: Diagnosis not present

## 2020-12-05 DIAGNOSIS — T85898A Other specified complication of other internal prosthetic devices, implants and grafts, initial encounter: Secondary | ICD-10-CM | POA: Diagnosis not present

## 2020-12-05 HISTORY — PX: IR FLUORO GUIDE CV LINE RIGHT: IMG2283

## 2020-12-05 HISTORY — PX: IR PTA VENOUS EXCEPT DIALYSIS CIRCUIT: IMG6126

## 2020-12-05 MED ORDER — CEFAZOLIN SODIUM-DEXTROSE 2-4 GM/100ML-% IV SOLN
INTRAVENOUS | Status: AC
  Administered 2020-12-05: 2000 mg
  Filled 2020-12-05: qty 100

## 2020-12-05 MED ORDER — CHLORHEXIDINE GLUCONATE 4 % EX LIQD
CUTANEOUS | Status: AC
  Filled 2020-12-05: qty 15

## 2020-12-05 MED ORDER — LIDOCAINE HCL (PF) 1 % IJ SOLN
INTRAMUSCULAR | Status: DC | PRN
  Administered 2020-12-05: 10 mL

## 2020-12-05 MED ORDER — LIDOCAINE HCL 1 % IJ SOLN
INTRAMUSCULAR | Status: AC
  Filled 2020-12-05: qty 20

## 2020-12-05 MED ORDER — HEPARIN SODIUM (PORCINE) 1000 UNIT/ML IJ SOLN
INTRAMUSCULAR | Status: AC
  Filled 2020-12-05: qty 1

## 2020-12-05 MED ORDER — IOHEXOL 300 MG/ML  SOLN
50.0000 mL | Freq: Once | INTRAMUSCULAR | Status: AC | PRN
  Administered 2020-12-05: 15 mL via INTRAVENOUS

## 2020-12-05 NOTE — Discharge Instructions (Addendum)
Central Line Dialysis Access Placement, Care After This sheet gives you information about how to care for yourself after your procedure. Your health care provider may also give you more specific instructions. If you have problems or questions, contact your health care provider. What can I expect after the procedure? After the procedure, it is common to have:  Mild pain or discomfort.  Mild redness, swelling, or bruising around your incision.  A small amount of blood or clear fluid coming from your incision. Follow these instructions at home: Incision care   Follow instructions from your health care provider about how to take care of your incision. Make sure you: ? Wash your hands with soap and water before you change your bandage (dressing). If soap and water are not available, use hand sanitizer. ? Change your dressing as told by your health care provider. ? Leave stitches (sutures) in place.  Check your incision area every day for signs of infection. Check for: ? More redness, swelling, or pain. ? More fluid or blood. ? Warmth. ? Pus or a bad smell.  If directed, put heat on the catheter site as often as told by your health care provider. Use the heat source that your health care provider recommends, such as a moist heat pack or a heating pad. ? Place a towel between your skin and the heat source. ? Leave the heat on for 20-30 minutes. ? Remove the heat if your skin turns bright red. This is especially important if you are unable to feel pain, heat, or cold. You may have a greater risk of getting burned.  If directed, put ice on the catheter site: ? Put ice in a plastic bag. ? Place a towel between your skin and the bag. ? Leave the ice on for 20 minutes, 2-3 times a day. Medicines  Take over-the-counter and prescription medicines only as told by your health care provider.  If you were prescribed an antibiotic medicine, use it as told by your health care provider. Do not stop  using the antibiotic even if you start to feel better. Activity  Return to your normal activities as told by your health care provider. Ask your health care provider what activities are safe for you.  Do not lift anything that is heavier than 10 lb (4.5 kg) until your health care provider says that this is safe. Driving  Do not drive for 24 hours if you were given a medicine to help you relax (sedative) during your procedure.  Do not drive or use heavy machinery while taking prescription pain medicine. Lifestyle  Limit alcohol intake to no more than 1 drink a day for nonpregnant women and 2 drinks a day for men. One drink equals 12 oz of beer, 5 oz of wine, or 1 oz of hard liquor.  Do not use any products that contain nicotine or tobacco, such as cigarettes and e-cigarettes. If you need help quitting, ask your health care provider. General instructions  Do not take baths or showers, swim, or use a hot tub until your health care provider approves. You may only be allowed to take sponge baths for bathing.  Wear compression stockings as told by your health care provider. These stockings help to prevent blood clots and reduce swelling in your legs.  Follow instructions from your health care provider about eating or drinking restrictions.  Keep all follow-up visits as told by your health care provider. This is important. Contact a health care provider if:  Your  catheter gets pulled out of place.  Your catheter site becomes itchy.  You develop a rash around your catheter site.  You have more redness, swelling, or pain around your incision.  You have more fluid or blood coming from your incision.  Your incision area feels warm to the touch.  You have pus or a bad smell coming from your incision.  You have a fever. Get help right away if:  You become light-headed or dizzy.  You faint.  You have difficulty breathing.  Your catheter gets pulled out completely. This  information is not intended to replace advice given to you by your health care provider. Make sure you discuss any questions you have with your health care provider. Document Revised: 11/25/2017 Document Reviewed: 09/06/2016 Elsevier Patient Education  2020 [employer].

## 2020-12-05 NOTE — Progress Notes (Signed)
Pt D/C home in wheelchair with caretaker. Awake and alert. In no distress

## 2020-12-05 NOTE — Procedures (Signed)
Interventional Radiology Procedure Note  Procedure: RT IJ HD CATH EXCHG REQUIRING SVC VENOGRAM AND 12MM SVC PTA FOR FIBRIN SHEATH    Complications: None  Estimated Blood Loss:  MIN  Findings: TIP SVCRA    Tamera Punt, MD

## 2020-12-06 DIAGNOSIS — N186 End stage renal disease: Secondary | ICD-10-CM | POA: Diagnosis not present

## 2020-12-06 DIAGNOSIS — Z992 Dependence on renal dialysis: Secondary | ICD-10-CM | POA: Diagnosis not present

## 2020-12-09 DIAGNOSIS — Z992 Dependence on renal dialysis: Secondary | ICD-10-CM | POA: Diagnosis not present

## 2020-12-09 DIAGNOSIS — N186 End stage renal disease: Secondary | ICD-10-CM | POA: Diagnosis not present

## 2020-12-09 DIAGNOSIS — N2581 Secondary hyperparathyroidism of renal origin: Secondary | ICD-10-CM | POA: Diagnosis not present

## 2020-12-11 ENCOUNTER — Other Ambulatory Visit (HOSPITAL_COMMUNITY): Payer: Self-pay | Admitting: Medical

## 2020-12-11 ENCOUNTER — Other Ambulatory Visit: Payer: Self-pay | Admitting: Radiology

## 2020-12-11 DIAGNOSIS — N186 End stage renal disease: Secondary | ICD-10-CM | POA: Diagnosis not present

## 2020-12-11 DIAGNOSIS — Z992 Dependence on renal dialysis: Secondary | ICD-10-CM | POA: Diagnosis not present

## 2020-12-12 ENCOUNTER — Other Ambulatory Visit (HOSPITAL_COMMUNITY): Payer: Self-pay | Admitting: Medical

## 2020-12-12 ENCOUNTER — Encounter (HOSPITAL_COMMUNITY): Payer: Self-pay

## 2020-12-12 ENCOUNTER — Other Ambulatory Visit: Payer: Self-pay

## 2020-12-12 ENCOUNTER — Ambulatory Visit (HOSPITAL_COMMUNITY)
Admission: RE | Admit: 2020-12-12 | Discharge: 2020-12-12 | Disposition: A | Discharge status: Home / Self Care | Payer: Medicare Other | Source: Ambulatory Visit | Attending: Medical | Admitting: Medical

## 2020-12-12 DIAGNOSIS — N186 End stage renal disease: Secondary | ICD-10-CM | POA: Insufficient documentation

## 2020-12-12 DIAGNOSIS — T82898A Other specified complication of vascular prosthetic devices, implants and grafts, initial encounter: Secondary | ICD-10-CM | POA: Diagnosis not present

## 2020-12-12 DIAGNOSIS — Y841 Kidney dialysis as the cause of abnormal reaction of the patient, or of later complication, without mention of misadventure at the time of the procedure: Secondary | ICD-10-CM | POA: Diagnosis not present

## 2020-12-12 DIAGNOSIS — T8249XA Other complication of vascular dialysis catheter, initial encounter: Secondary | ICD-10-CM | POA: Diagnosis not present

## 2020-12-12 DIAGNOSIS — Z4901 Encounter for fitting and adjustment of extracorporeal dialysis catheter: Secondary | ICD-10-CM | POA: Diagnosis not present

## 2020-12-12 HISTORY — PX: IR FLUORO GUIDE CV LINE RIGHT: IMG2283

## 2020-12-12 HISTORY — PX: IR US GUIDE VASC ACCESS RIGHT: IMG2390

## 2020-12-12 MED ORDER — MIDAZOLAM HCL 2 MG/2ML IJ SOLN
INTRAMUSCULAR | Status: AC | PRN
Start: 2020-12-12 — End: 2020-12-12
  Administered 2020-12-12: 0.5 mg via INTRAVENOUS

## 2020-12-12 MED ORDER — CEFAZOLIN SODIUM-DEXTROSE 2-4 GM/100ML-% IV SOLN
2.0000 g | INTRAVENOUS | Status: AC
  Administered 2020-12-12: 14:00:00 2 g via INTRAVENOUS

## 2020-12-12 MED ORDER — FENTANYL CITRATE (PF) 100 MCG/2ML IJ SOLN
INTRAMUSCULAR | Status: AC
  Filled 2020-12-12: qty 2

## 2020-12-12 MED ORDER — SODIUM CHLORIDE 0.9 % IV SOLN: INTRAVENOUS | Status: DC

## 2020-12-12 MED ORDER — HEPARIN SODIUM (PORCINE) 1000 UNIT/ML IJ SOLN
INTRAMUSCULAR | Status: AC
  Filled 2020-12-12: qty 1

## 2020-12-12 MED ORDER — FENTANYL CITRATE (PF) 100 MCG/2ML IJ SOLN
INTRAMUSCULAR | Status: AC | PRN
  Administered 2020-12-12 (×2): 25 ug via INTRAVENOUS

## 2020-12-12 MED ORDER — LIDOCAINE HCL 1 % IJ SOLN
INTRAMUSCULAR | Status: AC
  Filled 2020-12-12: qty 20

## 2020-12-12 MED ORDER — CEFAZOLIN SODIUM-DEXTROSE 2-4 GM/100ML-% IV SOLN
INTRAVENOUS | Status: AC
  Filled 2020-12-12: qty 100

## 2020-12-12 MED ORDER — MIDAZOLAM HCL 2 MG/2ML IJ SOLN
INTRAMUSCULAR | Status: AC
  Filled 2020-12-12: qty 2

## 2020-12-12 NOTE — H&P (Signed)
Patient Status: [facility] - Out-pt  Assessment and Plan: Non-functioning dialysis catheter Patient presents with non-functioning dialysis catheter.  She was last seen in [facility] Radiology 12/05/20 for same.  At that time she underwent fibrin sheath angioplasty and dialysis catheter exchange.   She presents today for exchange with fibrin sheath disruption vs. New placement.  Per patient, she has been NPO today.  She is accompanied by her Education officer, museum.   Risks and benefits discussed with the patient including, but not limited to bleeding, infection, vascular injury, pneumothorax which may require chest tube placement, air embolism or even death  All of the patient's questions were answered, patient is agreeable to proceed. Consent signed and in chart. ______________________________________________________________________   History of Present Illness: Tamara [name] is a 44 y.o. female with past medical history of ESRD on dilaysis via R IJ tunneled catheter.  Per Education officer, museum, this has been replaced 4 times in approximately 1 month's time. She presented for dialysis yesterday, however catheter was found to be non-functional.  She is scheduled for HD cath exchange, possible angioplasty, possible new placement.   Allergies and medications reviewed.   Review of Systems: A 12 point ROS discussed and pertinent positives are indicated in the HPI above.  All other systems are negative.  Review of Systems  Constitutional: Negative for fatigue and fever.  Respiratory: Negative for cough and shortness of breath.   Cardiovascular: Negative for chest pain.  Gastrointestinal: Negative for abdominal pain, diarrhea, nausea and vomiting.  Musculoskeletal: Negative for back pain.  Psychiatric/Behavioral: Negative for behavioral problems and confusion.    Vital Signs: There were no vitals taken for this visit.  Physical Exam Vitals and nursing note reviewed.  Constitutional:      General: She is not in  acute distress.    Appearance: Normal appearance. She is not ill-appearing.  HENT:     Mouth/Throat:     Mouth: Mucous membranes are dry.  Cardiovascular:     Rate and Rhythm: Normal rate and regular rhythm.  Pulmonary:     Effort: Pulmonary effort is normal.     Breath sounds: Normal breath sounds.  Abdominal:     General: Abdomen is flat.     Palpations: Abdomen is soft.  Skin:    General: Skin is warm and dry.  Neurological:     General: No focal deficit present.     Mental Status: She is alert and oriented to person, place, and time. Mental status is at baseline.  Psychiatric:        Mood and Affect: Mood normal.        Behavior: Behavior normal.        Thought Content: Thought content normal.        Judgment: Judgment normal.      Imaging reviewed.   Labs:  COAGS: No results for input(s): INR, APTT in the last 8760 hours.  BMP: Recent Labs    09/08/20 0305 09/09/20 1001 09/10/20 0248 09/12/20 1905  NA 140 140 137 139  K 3.1* 3.5 3.7 3.4*  CL 104 103 102 101  CO2 23 23 25 26   GLUCOSE 88 69* 80 95  BUN 28* 31* 12 32*  CALCIUM 9.0 8.7* 8.6* 9.5  CREATININE 7.77* 7.76* 3.95* 7.08*  GFRNONAA 6* 6* 13* 7*  GFRAA 7* 7* 15* 8*       Electronically Signed: Docia Barrier, PA 12/12/2020, 1:05 PM   I spent a total of 15 minutes in face to face  in clinical consultation, greater than 50% of which was counseling/coordinating care for venous access.

## 2020-12-12 NOTE — Procedures (Signed)
Interventional Radiology Procedure Note  Procedure: New right IJ tunneled HD catheter placement  Complications: None  Estimated Blood Loss: < 10 mL  Findings: New right IJ access and placement of 19 cm tip to cuff length Palindrome catheter with tip in RA. OK to use.  Venetia Night. Kathlene Cote, M.D Pager:  [phone]

## 2020-12-12 NOTE — Discharge Instructions (Addendum)
Central Line Dialysis Access Placement, Care After This sheet gives you information about how to care for yourself after your procedure. Your health care provider may also give you more specific instructions. If you have problems or questions, contact your health care provider. What can I expect after the procedure? After the procedure, it is common to have:  Mild pain or discomfort.  Mild redness, swelling, or bruising around your incision.  A small amount of blood or clear fluid coming from your incision. Follow these instructions at home: Incision care   Follow instructions from your health care provider about how to take care of your incision. Make sure you: ? Wash your hands with soap and water before you change your bandage (dressing). If soap and water are not available, use hand sanitizer. ? Change your dressing as told by your health care provider. ? Leave stitches (sutures) in place.  Check your incision area every day for signs of infection. Check for: ? More redness, swelling, or pain. ? More fluid or blood. ? Warmth. ? Pus or a bad smell.  If directed, put heat on the catheter site as often as told by your health care provider. Use the heat source that your health care provider recommends, such as a moist heat pack or a heating pad. ? Place a towel between your skin and the heat source. ? Leave the heat on for 20-30 minutes. ? Remove the heat if your skin turns bright red. This is especially important if you are unable to feel pain, heat, or cold. You may have a greater risk of getting burned.  If directed, put ice on the catheter site: ? Put ice in a plastic bag. ? Place a towel between your skin and the bag. ? Leave the ice on for 20 minutes, 2-3 times a day. Medicines  Take over-the-counter and prescription medicines only as told by your health care provider.  If you were prescribed an antibiotic medicine, use it as told by your health care provider. Do not stop  using the antibiotic even if you start to feel better. Activity  Return to your normal activities as told by your health care provider. Ask your health care provider what activities are safe for you.  Do not lift anything that is heavier than 10 lb (4.5 kg) until your health care provider says that this is safe. Driving  Do not drive for 24 hours if you were given a medicine to help you relax (sedative) during your procedure.  Do not drive or use heavy machinery while taking prescription pain medicine. Lifestyle  Limit alcohol intake to no more than 1 drink a day for nonpregnant women and 2 drinks a day for men. One drink equals 12 oz of beer, 5 oz of wine, or 1 oz of hard liquor.  Do not use any products that contain nicotine or tobacco, such as cigarettes and e-cigarettes. If you need help quitting, ask your health care provider. General instructions  Do not take baths or showers, swim, or use a hot tub until your health care provider approves. You may only be allowed to take sponge baths for bathing.  Wear compression stockings as told by your health care provider. These stockings help to prevent blood clots and reduce swelling in your legs.  Follow instructions from your health care provider about eating or drinking restrictions.  Keep all follow-up visits as told by your health care provider. This is important. Contact a health care provider if:  Your  catheter gets pulled out of place.  Your catheter site becomes itchy.  You develop a rash around your catheter site.  You have more redness, swelling, or pain around your incision.  You have more fluid or blood coming from your incision.  Your incision area feels warm to the touch.  You have pus or a bad smell coming from your incision.  You have a fever. Get help right away if:  You become light-headed or dizzy.  You faint.  You have difficulty breathing.  Your catheter gets pulled out completely. This  information is not intended to replace advice given to you by your health care provider. Make sure you discuss any questions you have with your health care provider. Document Revised: 11/25/2017 Document Reviewed: 09/06/2016 Elsevier Patient Education  [place]. Moderate Conscious Sedation, Adult Sedation is the use of medicines to promote relaxation and relieve discomfort and anxiety. Moderate conscious sedation is a type of sedation. Under moderate conscious sedation, you are less alert than normal, but you are still able to respond to instructions, touch, or both. Moderate conscious sedation is used during short medical and dental procedures. It is milder than deep sedation, which is a type of sedation under which you cannot be easily woken up. It is also milder than general anesthesia, which is the use of medicines to make you unconscious. Moderate conscious sedation allows you to return to your regular activities sooner. Tell a health care provider about:  Any allergies you have.  All medicines you are taking, including vitamins, herbs, eye drops, creams, and over-the-counter medicines.  Use of steroids (by mouth or creams).  Any problems you or family members have had with sedatives and anesthetic medicines.  Any blood disorders you have.  Any surgeries you have had.  Any medical conditions you have, such as sleep apnea.  Whether you are pregnant or may be pregnant.  Any use of cigarettes, alcohol, marijuana, or street drugs. What are the risks? Generally, this is a safe procedure. However, problems may occur, including:  Getting too much medicine (oversedation).  Nausea.  Allergic reaction to medicines.  Trouble breathing. If this happens, a breathing tube may be used to help with breathing. It will be removed when you are awake and breathing on your own.  Heart trouble.  Lung trouble. What happens before the procedure? Staying hydrated Follow instructions  from your health care provider about hydration, which may include:  Up to 2 hours before the procedure - you may continue to drink clear liquids, such as water, clear fruit juice, black coffee, and plain tea. Eating and drinking restrictions Follow instructions from your health care provider about eating and drinking, which may include:  8 hours before the procedure - stop eating heavy meals or foods such as meat, fried foods, or fatty foods.  6 hours before the procedure - stop eating light meals or foods, such as toast or cereal.  6 hours before the procedure - stop drinking milk or drinks that contain milk.  2 hours before the procedure - stop drinking clear liquids. Medicine Ask your health care provider about:  Changing or stopping your regular medicines. This is especially important if you are taking diabetes medicines or blood thinners.  Taking medicines such as aspirin and ibuprofen. These medicines can thin your blood. Do not take these medicines before your procedure if your health care provider instructs you not to.  Tests and exams  You will have a physical exam.  You may  have blood tests done to show: ? How well your kidneys and liver are working. ? How well your blood can clot. General instructions  Plan to have someone take you home from the hospital or clinic.  If you will be going home right after the procedure, plan to have someone with you for 24 hours. What happens during the procedure?  An IV tube will be inserted into one of your veins.  Medicine to help you relax (sedative) will be given through the IV tube.  The medical or dental procedure will be performed. What happens after the procedure?  Your blood pressure, heart rate, breathing rate, and blood oxygen level will be monitored often until the medicines you were given have worn off.  Do not drive for 24 hours. This information is not intended to replace advice given to you by your health care  provider. Make sure you discuss any questions you have with your health care provider. Document Revised: 11/25/2017 Document Reviewed: 04/03/2016 Elsevier Patient Education  2020 [employer].

## 2020-12-12 NOTE — Progress Notes (Signed)
Orders received by Dr. Kathlene Cote to discharge patient to home. See Epic for today's assessments. Reviewed discharge instructions including: Home medications, S&S to report to provider, follow-up appointments, diet, activity, and allergies with patient's guardian. Patient reports no complaints of pain at this time.  Patient's guardian states having no further questions at this time and will contact the numbers provided if any issues arise. VSS per patient baseline upon discharge. Patient and patient's guardian wheeled to front of hospital by RN until transportation arrived.

## 2020-12-13 DIAGNOSIS — Z992 Dependence on renal dialysis: Secondary | ICD-10-CM | POA: Diagnosis not present

## 2020-12-13 DIAGNOSIS — N186 End stage renal disease: Secondary | ICD-10-CM | POA: Diagnosis not present

## 2020-12-15 DIAGNOSIS — D631 Anemia in chronic kidney disease: Secondary | ICD-10-CM | POA: Diagnosis not present

## 2020-12-15 DIAGNOSIS — N186 End stage renal disease: Secondary | ICD-10-CM | POA: Diagnosis not present

## 2020-12-16 DIAGNOSIS — N186 End stage renal disease: Secondary | ICD-10-CM | POA: Diagnosis not present

## 2020-12-16 DIAGNOSIS — Z992 Dependence on renal dialysis: Secondary | ICD-10-CM | POA: Diagnosis not present

## 2020-12-18 DIAGNOSIS — N186 End stage renal disease: Secondary | ICD-10-CM | POA: Diagnosis not present

## 2020-12-18 DIAGNOSIS — Z992 Dependence on renal dialysis: Secondary | ICD-10-CM | POA: Diagnosis not present

## 2020-12-19 DIAGNOSIS — R112 Nausea with vomiting, unspecified: Secondary | ICD-10-CM | POA: Diagnosis not present

## 2020-12-21 DIAGNOSIS — N186 End stage renal disease: Secondary | ICD-10-CM | POA: Diagnosis not present

## 2020-12-21 DIAGNOSIS — Z992 Dependence on renal dialysis: Secondary | ICD-10-CM | POA: Diagnosis not present

## 2020-12-22 DIAGNOSIS — Z992 Dependence on renal dialysis: Secondary | ICD-10-CM | POA: Diagnosis not present

## 2020-12-22 DIAGNOSIS — N186 End stage renal disease: Secondary | ICD-10-CM | POA: Diagnosis not present

## 2020-12-22 DIAGNOSIS — I951 Orthostatic hypotension: Secondary | ICD-10-CM | POA: Diagnosis not present

## 2020-12-23 ENCOUNTER — Other Ambulatory Visit: Payer: Self-pay

## 2020-12-23 DIAGNOSIS — D509 Iron deficiency anemia, unspecified: Secondary | ICD-10-CM | POA: Diagnosis not present

## 2020-12-23 DIAGNOSIS — N186 End stage renal disease: Secondary | ICD-10-CM

## 2020-12-23 DIAGNOSIS — Z992 Dependence on renal dialysis: Secondary | ICD-10-CM | POA: Diagnosis not present

## 2020-12-25 ENCOUNTER — Other Ambulatory Visit (HOSPITAL_COMMUNITY): Payer: Self-pay | Admitting: Physician Assistant

## 2020-12-25 ENCOUNTER — Other Ambulatory Visit: Payer: Self-pay | Admitting: Student

## 2020-12-25 ENCOUNTER — Other Ambulatory Visit (HOSPITAL_COMMUNITY): Payer: Self-pay | Admitting: Medical

## 2020-12-25 DIAGNOSIS — N186 End stage renal disease: Secondary | ICD-10-CM | POA: Diagnosis not present

## 2020-12-25 DIAGNOSIS — Z992 Dependence on renal dialysis: Secondary | ICD-10-CM | POA: Diagnosis not present

## 2020-12-26 DIAGNOSIS — Z992 Dependence on renal dialysis: Secondary | ICD-10-CM | POA: Diagnosis not present

## 2020-12-26 DIAGNOSIS — N186 End stage renal disease: Secondary | ICD-10-CM | POA: Diagnosis not present

## 2020-12-29 ENCOUNTER — Other Ambulatory Visit (HOSPITAL_COMMUNITY): Payer: Self-pay | Admitting: Medical

## 2020-12-29 ENCOUNTER — Ambulatory Visit (HOSPITAL_COMMUNITY)
Admission: RE | Admit: 2020-12-29 | Discharge: 2020-12-29 | Disposition: A | Discharge status: Home / Self Care | Payer: Medicare Other | Source: Ambulatory Visit | Attending: Medical | Admitting: Medical

## 2020-12-29 ENCOUNTER — Other Ambulatory Visit: Payer: Self-pay

## 2020-12-29 DIAGNOSIS — N186 End stage renal disease: Secondary | ICD-10-CM | POA: Diagnosis not present

## 2020-12-29 DIAGNOSIS — Z4901 Encounter for fitting and adjustment of extracorporeal dialysis catheter: Secondary | ICD-10-CM | POA: Diagnosis not present

## 2020-12-29 DIAGNOSIS — T82898A Other specified complication of vascular prosthetic devices, implants and grafts, initial encounter: Secondary | ICD-10-CM | POA: Diagnosis not present

## 2020-12-29 DIAGNOSIS — Z79899 Other long term (current) drug therapy: Secondary | ICD-10-CM | POA: Insufficient documentation

## 2020-12-29 DIAGNOSIS — I120 Hypertensive chronic kidney disease with stage 5 chronic kidney disease or end stage renal disease: Secondary | ICD-10-CM | POA: Diagnosis not present

## 2020-12-29 HISTORY — PX: IR AV DIALY SHUNT INTRO NEEDLE/INTRACATH INITIAL W/PTA/IMG RIGHT: IMG6116

## 2020-12-29 HISTORY — PX: IR PTA ADDL CENTRAL DIALYSIS SEG THRU DIALY CIRCUIT RIGHT: IMG6121

## 2020-12-29 HISTORY — PX: IR FLUORO GUIDE CV LINE RIGHT: IMG2283

## 2020-12-29 HISTORY — PX: IR PTA VENOUS ADDL EXCEPT DIALYSIS CIRCUIT: IMG6127

## 2020-12-29 HISTORY — PX: IR INTRAVASCULAR ULTRASOUND NON CORONARY: IMG6085

## 2020-12-29 LAB — POCT I-STAT, CHEM 8
BUN: 25 mg/dL — ABNORMAL HIGH (ref 6–20)
Calcium, Ion: 1.22 mmol/L (ref 1.15–1.40)
Chloride: 99 mmol/L (ref 98–111)
Creatinine, Ser: 5.7 mg/dL — ABNORMAL HIGH (ref 0.44–1.00)
Glucose, Bld: 74 mg/dL (ref 70–99)
HCT: 25 % — ABNORMAL LOW (ref 36.0–46.0)
Hemoglobin: 8.5 g/dL — ABNORMAL LOW (ref 12.0–15.0)
Potassium: 3.4 mmol/L — ABNORMAL LOW (ref 3.5–5.1)
Sodium: 137 mmol/L (ref 135–145)
TCO2: 24 mmol/L (ref 22–32)

## 2020-12-29 MED ORDER — SODIUM CHLORIDE 0.9 % IV SOLN: INTRAVENOUS | Status: DC

## 2020-12-29 MED ORDER — MIDAZOLAM HCL 2 MG/2ML IJ SOLN
INTRAMUSCULAR | Status: AC | PRN
  Administered 2020-12-29 (×2): 0.5 mg via INTRAVENOUS

## 2020-12-29 MED ORDER — CEFAZOLIN SODIUM-DEXTROSE 2-4 GM/100ML-% IV SOLN
2.0000 g | Freq: Once | INTRAVENOUS | Status: AC
  Administered 2020-12-29: 2 g via INTRAVENOUS

## 2020-12-29 MED ORDER — MIDAZOLAM HCL 2 MG/2ML IJ SOLN
INTRAMUSCULAR | Status: AC
  Filled 2020-12-29: qty 2

## 2020-12-29 MED ORDER — LIDOCAINE-EPINEPHRINE 1 %-1:100000 IJ SOLN
INTRAMUSCULAR | Status: AC
  Filled 2020-12-29: qty 1

## 2020-12-29 MED ORDER — CEFAZOLIN SODIUM-DEXTROSE 2-4 GM/100ML-% IV SOLN
INTRAVENOUS | Status: AC
  Filled 2020-12-29: qty 100

## 2020-12-29 MED ORDER — FENTANYL CITRATE (PF) 100 MCG/2ML IJ SOLN
INTRAMUSCULAR | Status: AC
  Filled 2020-12-29: qty 2

## 2020-12-29 MED ORDER — HEPARIN SODIUM (PORCINE) 1000 UNIT/ML IJ SOLN
INTRAMUSCULAR | Status: AC
  Filled 2020-12-29: qty 1

## 2020-12-29 MED ORDER — FENTANYL CITRATE (PF) 100 MCG/2ML IJ SOLN
INTRAMUSCULAR | Status: AC | PRN
  Administered 2020-12-29 (×2): 25 ug via INTRAVENOUS

## 2020-12-29 MED ORDER — CHLORHEXIDINE GLUCONATE 4 % EX LIQD
CUTANEOUS | Status: AC
  Filled 2020-12-29: qty 15

## 2020-12-29 MED ORDER — IOHEXOL 300 MG/ML  SOLN
100.0000 mL | Freq: Once | INTRAMUSCULAR | Status: AC | PRN
  Administered 2020-12-29: 45 mL via INTRAVENOUS

## 2020-12-29 NOTE — Procedures (Signed)
Interventional Radiology Procedure Note  Procedure:  1) Central venogram 2) Intravascular ultrasound 3) Central venoplasty 4) Tunneled hemodialysis catheter exchange  Findings: Please refer to procedural dictation for full description.  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  Angioplasty performed in SVC (7 mm) and right atrium (14 mm) to disrupt sheath.  Exchanged for 19 mm tunneled HD.  Complications: None immediate  Estimated Blood Loss: < 5 mL  Recommendations: Catheter ready for immediate use. Will discuss potential for future intervention on right atrium chronic thrombus with Nephrology.  Ruthann Cancer, MD Pager: [phone]

## 2020-12-29 NOTE — H&P (Signed)
Chief Complaint: Patient was seen in consultation today for tunneled HD catheter exchange/possible angioplasty/possible new tunneled HD catheter placement.  Referring Physician(s): Tamara [name]  Supervising Physician: Tamara [name]  Patient Status: [facility] - Out-pt  History of Present Illness: Tamara [name] is a 45 y.o. female with a past medical history significant for anxiety, depression, HTN and ESRD on HD who presents today for a tunneled HD catheter evaluation. Tamara [name] is well known to IR due to a history of poorly functioning HD catheters and fibrin sheath development requiring multiple exchanges/fibrin sheath disruption/angioplasty/replacements - she was last seen in IR on 12/12/20 when she had a new right IJ catheter placed due to poor function of existing catheter. She presents today for evaluation of her poorly functioning HD catheter with possible exchange/angioplasty/new tunneled HD catheter placement with moderate sedation.  Tamara [name] denies any complaints today, she went to dialysis yesterday and completed the entire session. She is note sure what is wrong with her catheter but was told it needs to be fixed. She understands the procedure today and is agreeable to proceed. Discussed procedure via phone with patient's legal guardian Tamara [name] who gives verbal consent to proceed.   Past Medical History:  Diagnosis Date  . Abnormal uterine bleeding (AUB) 01/26/2016  . Anxiety and depression    mentally slow  . Chronic abdominal pain   . Depression   . Hematuria 01/26/2016  . Hypertension   . Leg pain   . Obesity   . Ovarian cyst 02/10/2016  . PUD (peptic ulcer disease)   . Reflux   . S/P colonoscopy 03/15/11   friable anal canal  . Sebaceous cyst of labia 01/26/2016  . Yeast vaginitis 09/19/2013    Past Surgical History:  Procedure Laterality Date  . IR FLUORO GUIDE CV LINE RIGHT  12/05/2020  . IR FLUORO GUIDE CV LINE RIGHT  12/12/2020  . IR PTA VENOUS EXCEPT  DIALYSIS CIRCUIT  12/05/2020  . IR US GUIDE VASC ACCESS RIGHT  12/12/2020  . TOOTH EXTRACTION N/A 06/03/2014   Procedure: EXTRACTION WISDOM TEETH;  Surgeon: Tamara [name], DDS;  Location: [place];  Service: Oral Surgery;  Laterality: N/A;  . TUBAL LIGATION      Allergies: Patient has no known allergies.  Medications: Prior to Admission medications   Medication Sig Start Date End Date Taking? Authorizing Provider  acetaminophen (TYLENOL) 325 MG tablet Take 650 mg by mouth 4 (four) times daily as needed for moderate pain.     [provider]  allopurinol (ZYLOPRIM) 300 MG tablet Take 300 mg by mouth daily.  09/05/13   [provider]  ALPRAZolam Duanne Moron) 0.5 MG tablet Take 0.5 mg by mouth 3 (three) times daily as needed. Anxiety    [provider]  amLODIPine Besy-Benazepril HCl (LOTREL PO) Take 1 capsule by mouth daily.    [provider]  cetirizine (ZYRTEC) 10 MG tablet Take 10 mg by mouth daily.    [provider]  chlorthalidone (HYGROTON) 25 MG tablet Take 25 mg by mouth daily.    [provider]  cloNIDine (CATAPRES) 0.2 MG tablet Take 0.2 mg by mouth 3 (three) times daily.    [provider]  COLCRYS 0.6 MG tablet Take 0.6 mg by mouth 2 (two) times daily.  09/15/13   [provider]  colestipol (COLESTID) 1 g tablet Take 1 g by mouth in the morning, at noon, and at bedtime.    [provider]  DULoxetine (CYMBALTA) 60  MG capsule Take 60 mg by mouth at bedtime.      [provider]  furosemide (LASIX) 40 MG tablet Take 40 mg by mouth daily.    [provider]  HYDROcodone-acetaminophen (NORCO/VICODIN) 5-325 MG per tablet Take 1 tablet by mouth 4 (four) times daily.  12/13/14   [provider]  lactulose (CHRONULAC) 10 GM/15ML solution Take 20 g by mouth at bedtime.    [provider]  medroxyPROGESTERone (PROVERA) 10 MG tablet TAKE 1 TABLET ONCE DAILY FOR 2 WEEKS. REPEAT EVERY  3 MONTHS Patient taking differently: Take 10 mg by mouth See admin instructions. Take 1 tablet for 2 weeks, repeat every 3 months 12/23/19   [name], MD  metoCLOPramide (REGLAN) 5 MG tablet Take 5 mg by mouth 3 (three) times daily as needed for nausea or vomiting.  09/03/20   [provider]  metoprolol (LOPRESSOR) 100 MG tablet Take 100 mg by mouth 2 (two) times daily.  11/29/14   [provider]  midodrine (PROAMATINE) 10 MG tablet Take 10 mg by mouth 3 (three) times a week.    [provider]  mirtazapine (REMERON) 15 MG tablet Take 15 mg by mouth at bedtime. 08/27/20   [provider]  omeprazole (PRILOSEC) 20 MG capsule Take 20 mg by mouth daily.      [provider]  ondansetron (ZOFRAN-ODT) 4 MG disintegrating tablet Take 4 mg by mouth every 8 (eight) hours as needed for nausea or vomiting.    [provider]  oxyCODONE-acetaminophen (PERCOCET/ROXICET) 5-325 MG tablet Take 2 tablets by mouth every 6 (six) hours as needed for moderate pain.  09/02/20   [provider]  Pancrelipase, Lip-Prot-Amyl, (CREON) 24000-76000 units CPEP Take 1 capsule by mouth in the morning, at noon, in the evening, and at bedtime.     [provider]     Family History  Problem Relation Age of Onset  . Colon polyps Father   . Hypertension Father   . Diabetes Father   . Seizures Mother   . Hypertension Mother   . Diabetes Mother   . Diabetes Paternal Grandmother   . Colon [name] Neg Hx     Social History   Socioeconomic History  . Marital status: Single    Spouse name: Not on file  . Number of children: 0  . Years of education: Not on file  . Highest education level: Not on file  Occupational History  . Occupation: diabled    Employer: NOT EMPLOYED  Tobacco Use  . Smoking status: Never Smoker  . Smokeless tobacco: Never Used  Vaping Use  . Vaping Use: Never used  Substance and Sexual Activity  . Alcohol use: No  . Drug  use: No  . Sexual activity: Not Currently    Birth control/protection: Surgical    Comment: tubal  Other Topics Concern  . Not on file  Social History Narrative  . Not on file   Social Determinants of Health   Financial Resource Strain: Not on file  Food Insecurity: Not on file  Transportation Needs: Not on file  Physical Activity: Not on file  Stress: Not on file  Social Connections: Not on file     Review of Systems: A 12 point ROS discussed and pertinent positives are indicated in the HPI above.  All other systems are negative.  Review of Systems  Constitutional: Negative for chills and fever.  Respiratory: Negative for cough and shortness of breath.  Cardiovascular: Negative for chest pain.  Gastrointestinal: Negative for abdominal pain.  Musculoskeletal: Negative for back pain.  Neurological: Negative for headaches.    Vital Signs: BP 121/83   Pulse 70   Temp 97.6 F (36.4 C) (Oral)   Ht 5\' 5"  (1.651 m)   Wt 154 lb 5.2 oz (70 kg)   SpO2 100%   BMI 25.68 kg/m   Physical Exam Vitals reviewed.  Constitutional:      General: She is not in acute distress. HENT:     Head: Normocephalic.     Mouth/Throat:     Mouth: Mucous membranes are moist.     Pharynx: Oropharynx is clear. No oropharyngeal exudate or posterior oropharyngeal erythema.  Cardiovascular:     Rate and Rhythm: Normal rate and regular rhythm.     Comments: (+) right IJ HD catheter Pulmonary:     Effort: Pulmonary effort is normal.     Breath sounds: Normal breath sounds.  Abdominal:     General: There is no distension.     Palpations: Abdomen is soft.     Tenderness: There is no abdominal tenderness.  Skin:    General: Skin is warm and dry.  Neurological:     Mental Status: She is alert and oriented to person, place, and time.  Psychiatric:        Mood and Affect: Mood normal.        Behavior: Behavior normal.        Thought Content: Thought content normal.        Judgment: Judgment  normal.      MD Evaluation Airway: WNL Heart: WNL Abdomen: WNL Chest/ Lungs: WNL ASA  Classification: 3 Mallampati/Airway Score: Two   Imaging: IR Fluoro Guide CV Line Right  Result Date: 12/12/2020 CLINICAL DATA:  Status post multiple recent dialysis catheter exchange procedures an indwelling tunneled right IJ hemodialysis catheter as an outpatient both by Nephrology and IR with the latest procedure just one week ago on 12/05/2020 that included fibrin sheath disruption with balloon dilatation prior to catheter exchange. The indwelling catheter continues to demonstrate poor flow and function during hemodialysis. Given multitude of recent catheter exchange procedures, decision was made to place a new tunnel catheter via new venous access to try to completely avoid the indwelling fibrin sheath around the pre-existing catheter. EXAM: TUNNELED CENTRAL VENOUS HEMODIALYSIS CATHETER PLACEMENT WITH ULTRASOUND AND FLUOROSCOPIC GUIDANCE ANESTHESIA/SEDATION: 0.5 mg IV Versed; 50 mcg IV Fentanyl. Total Moderate Sedation Time:   30 minutes. The patient's level of consciousness and physiologic status were continuously monitored during the procedure by Radiology nursing. MEDICATIONS: 2 g IV Ancef. FLUOROSCOPY TIME:  36 seconds.  2.0 mGy. PROCEDURE: The procedure, risks, benefits, and alternatives were explained to the patient. Questions regarding the procedure were encouraged and answered. The patient understands and consents to the procedure. A timeout was performed prior to initiating the procedure. The right neck and chest were prepped with chlorhexidine in a sterile fashion, and a sterile drape was applied covering the operative field. Maximum barrier sterile technique with sterile gowns and gloves were used for the procedure. Local anesthesia was provided with 1% lidocaine. Ultrasound was used to confirm patency of the right internal jugular vein. After creating a small venotomy incision, a 21 gauge needle  was advanced into the right internal jugular vein under direct, real-time ultrasound guidance. Ultrasound image documentation was performed. After securing guidewire access, an 8 Fr dilator was placed. A J-wire was kinked to measure appropriate catheter length. A  Palindrome tunneled hemodialysis catheter measuring 19 cm from tip to cuff was chosen for placement. This was tunneled in a retrograde fashion from the chest wall to the venotomy incision. At the venotomy, serial dilatation was performed and a 15 Fr peel-away sheath was placed over a guidewire. The catheter was then placed through the sheath and the sheath removed. Final catheter positioning was confirmed and documented with a fluoroscopic spot image. The catheter was aspirated, flushed with saline, and injected with appropriate volume heparin dwells. The venotomy incision was closed with subcuticular 4-0 Vicryl. Dermabond was applied to the incision. The catheter exit site was secured with 0-Prolene retention sutures. After new catheter placement, the old, pre-existing tunneled catheter was then removed from the right chest after infiltration of 1% lidocaine with manual traction. The entire catheter was removed. COMPLICATIONS: None.  No pneumothorax. FINDINGS: After new access of the right internal jugular vein, a guidewire descended medial to the pre-existing dialysis catheter in a different portion of the SVC. After new tunneled catheter placement, the tip lies in the right atrium. The catheter aspirates normally and is ready for immediate use. The previously placed tunneled catheter was removed without difficulty and in its entirety. IMPRESSION: Placement of tunneled hemodialysis catheter via new access of the right internal jugular vein. The catheter tip lies in the right atrium. The catheter is ready for immediate use. The pre-existing malfunctioning catheter was removed following placement of the new tunneled hemodialysis catheter. Electronically  Signed   By: Aletta Edouard M.D.   On: 12/12/2020 15:43   IR Fluoro Guide CV Line Right  Result Date: 12/06/2020 INDICATION: End-stage renal disease, poor functioning right IJ dialysis catheter EXAM: Right IJ tunneled dialysis catheter exchange 12 mm venous angioplasty of the SVC for chronic catheter fibrin sheath disruption MEDICATIONS: 1% lidocaine local Ancef 2 g administered within 1 hour of the procedure. ANESTHESIA/SEDATION: Moderate Sedation Time: None. Minutes. The patient's level of consciousness and vital signs were monitored continuously by radiology nursing throughout the procedure under my direct supervision. FLUOROSCOPY TIME:  Fluoroscopy Time: 2 minutes 0 seconds (28 mGy). COMPLICATIONS: None immediate. PROCEDURE: Informed written consent was obtained from the patient after a thorough discussion of the procedural risks, benefits and alternatives. All questions were addressed. Maximal Sterile Barrier Technique was utilized including caps, mask, sterile gowns, sterile gloves, sterile drape, hand hygiene and skin antiseptic. A timeout was performed prior to the initiation of the procedure. Under sterile conditions and local anesthesia, the existing tunneled right IJ catheter was released from the subcutaneous tunnel with sharp and blunt dissection. Catheter removed over a stiff Glidewire. The Glidewire access advanced into the IVC. A new 19 cm tip to cuff palindrome catheter was advanced. Tip position SVC RA junction. Blood aspirated poorly from the blue port but easily from the red port suspicious for fibrin sheath. Catheter was retracted into the proximal SVC. Contrast injection performed. Limited SVC venogram confirms fibrin sheath and nonocclusive SVC filling defect/thrombus. Stiff Glidewire readvanced into the IVC. 12 mm mustang balloon advanced over the guidewire. Overlapping 12 mm angioplasty performed throughout the SVC for fibrin sheath disruption. Palindrome catheter readvanced over the  Glidewire. Tip positioned in the proximal SVC. Repeat SVC venogram demonstrates nonocclusive irregular SVC filling defect compatible with chronic thrombus/disrupted fibrin sheath. There is improved SVC caliber. Catheter tip readvanced to the SVC RA junction. At this point, both red and blue lumens aspirated blood easily. Saline flush performed followed by appropriate volume strength of heparin instilled in both lumens. External  caps applied. Catheter secured with Prolene suture and a sterile dressing. No immediate complication. IMPRESSION: Successful right IJ tunneled dialysis catheter exchange requiring 12 mm venous angioplasty of the SVC for fibrin sheath disruption as above. Electronically Signed   By: Jerilynn Mages.  Shick M.D.   On: 12/06/2020 08:53   IR US Guide Vasc Access Right  Result Date: 12/12/2020 CLINICAL DATA:  Status post multiple recent dialysis catheter exchange procedures an indwelling tunneled right IJ hemodialysis catheter as an outpatient both by Nephrology and IR with the latest procedure just one week ago on 12/05/2020 that included fibrin sheath disruption with balloon dilatation prior to catheter exchange. The indwelling catheter continues to demonstrate poor flow and function during hemodialysis. Given multitude of recent catheter exchange procedures, decision was made to place a new tunnel catheter via new venous access to try to completely avoid the indwelling fibrin sheath around the pre-existing catheter. EXAM: TUNNELED CENTRAL VENOUS HEMODIALYSIS CATHETER PLACEMENT WITH ULTRASOUND AND FLUOROSCOPIC GUIDANCE ANESTHESIA/SEDATION: 0.5 mg IV Versed; 50 mcg IV Fentanyl. Total Moderate Sedation Time:   30 minutes. The patient's level of consciousness and physiologic status were continuously monitored during the procedure by Radiology nursing. MEDICATIONS: 2 g IV Ancef. FLUOROSCOPY TIME:  36 seconds.  2.0 mGy. PROCEDURE: The procedure, risks, benefits, and alternatives were explained to the patient.  Questions regarding the procedure were encouraged and answered. The patient understands and consents to the procedure. A timeout was performed prior to initiating the procedure. The right neck and chest were prepped with chlorhexidine in a sterile fashion, and a sterile drape was applied covering the operative field. Maximum barrier sterile technique with sterile gowns and gloves were used for the procedure. Local anesthesia was provided with 1% lidocaine. Ultrasound was used to confirm patency of the right internal jugular vein. After creating a small venotomy incision, a 21 gauge needle was advanced into the right internal jugular vein under direct, real-time ultrasound guidance. Ultrasound image documentation was performed. After securing guidewire access, an 8 Fr dilator was placed. A J-wire was kinked to measure appropriate catheter length. A Palindrome tunneled hemodialysis catheter measuring 19 cm from tip to cuff was chosen for placement. This was tunneled in a retrograde fashion from the chest wall to the venotomy incision. At the venotomy, serial dilatation was performed and a 15 Fr peel-away sheath was placed over a guidewire. The catheter was then placed through the sheath and the sheath removed. Final catheter positioning was confirmed and documented with a fluoroscopic spot image. The catheter was aspirated, flushed with saline, and injected with appropriate volume heparin dwells. The venotomy incision was closed with subcuticular 4-0 Vicryl. Dermabond was applied to the incision. The catheter exit site was secured with 0-Prolene retention sutures. After new catheter placement, the old, pre-existing tunneled catheter was then removed from the right chest after infiltration of 1% lidocaine with manual traction. The entire catheter was removed. COMPLICATIONS: None.  No pneumothorax. FINDINGS: After new access of the right internal jugular vein, a guidewire descended medial to the pre-existing dialysis  catheter in a different portion of the SVC. After new tunneled catheter placement, the tip lies in the right atrium. The catheter aspirates normally and is ready for immediate use. The previously placed tunneled catheter was removed without difficulty and in its entirety. IMPRESSION: Placement of tunneled hemodialysis catheter via new access of the right internal jugular vein. The catheter tip lies in the right atrium. The catheter is ready for immediate use. The pre-existing malfunctioning catheter was removed following  placement of the new tunneled hemodialysis catheter. Electronically Signed   By: Aletta Edouard M.D.   On: 12/12/2020 15:43   IR PTA VENOUS EXCEPT DIALYSIS CIRCUIT  Result Date: 12/06/2020 INDICATION: End-stage renal disease, poor functioning right IJ dialysis catheter EXAM: Right IJ tunneled dialysis catheter exchange 12 mm venous angioplasty of the SVC for chronic catheter fibrin sheath disruption MEDICATIONS: 1% lidocaine local Ancef 2 g administered within 1 hour of the procedure. ANESTHESIA/SEDATION: Moderate Sedation Time: None. Minutes. The patient's level of consciousness and vital signs were monitored continuously by radiology nursing throughout the procedure under my direct supervision. FLUOROSCOPY TIME:  Fluoroscopy Time: 2 minutes 0 seconds (28 mGy). COMPLICATIONS: None immediate. PROCEDURE: Informed written consent was obtained from the patient after a thorough discussion of the procedural risks, benefits and alternatives. All questions were addressed. Maximal Sterile Barrier Technique was utilized including caps, mask, sterile gowns, sterile gloves, sterile drape, hand hygiene and skin antiseptic. A timeout was performed prior to the initiation of the procedure. Under sterile conditions and local anesthesia, the existing tunneled right IJ catheter was released from the subcutaneous tunnel with sharp and blunt dissection. Catheter removed over a stiff Glidewire. The Glidewire  access advanced into the IVC. A new 19 cm tip to cuff palindrome catheter was advanced. Tip position SVC RA junction. Blood aspirated poorly from the blue port but easily from the red port suspicious for fibrin sheath. Catheter was retracted into the proximal SVC. Contrast injection performed. Limited SVC venogram confirms fibrin sheath and nonocclusive SVC filling defect/thrombus. Stiff Glidewire readvanced into the IVC. 12 mm mustang balloon advanced over the guidewire. Overlapping 12 mm angioplasty performed throughout the SVC for fibrin sheath disruption. Palindrome catheter readvanced over the Glidewire. Tip positioned in the proximal SVC. Repeat SVC venogram demonstrates nonocclusive irregular SVC filling defect compatible with chronic thrombus/disrupted fibrin sheath. There is improved SVC caliber. Catheter tip readvanced to the SVC RA junction. At this point, both red and blue lumens aspirated blood easily. Saline flush performed followed by appropriate volume strength of heparin instilled in both lumens. External caps applied. Catheter secured with Prolene suture and a sterile dressing. No immediate complication. IMPRESSION: Successful right IJ tunneled dialysis catheter exchange requiring 12 mm venous angioplasty of the SVC for fibrin sheath disruption as above. Electronically Signed   By: Jerilynn Mages.  Shick M.D.   On: 12/06/2020 08:53    Labs:  CBC: Recent Labs    09/08/20 0305 09/09/20 1001 09/10/20 0248 09/12/20 1905  WBC 11.5* 11.8* 10.3 9.7  HGB 8.8* 8.6* 9.6* 10.4*  HCT 26.8* 26.5* 28.1* 31.5*  PLT 414* 366 303 363    COAGS: No results for input(s): INR, APTT in the last 8760 hours.  BMP: Recent Labs    09/08/20 0305 09/09/20 1001 09/10/20 0248 09/12/20 1905  NA 140 140 137 139  K 3.1* 3.5 3.7 3.4*  CL 104 103 102 101  CO2 23 23 25 26   GLUCOSE 88 69* 80 95  BUN 28* 31* 12 32*  CALCIUM 9.0 8.7* 8.6* 9.5  CREATININE 7.77* 7.76* 3.95* 7.08*  GFRNONAA 6* 6* 13* 7*  GFRAA 7* 7*  15* 8*    LIVER FUNCTION TESTS: Recent Labs    09/05/20 1227 09/09/20 1001 09/12/20 1905  BILITOT 1.1  --  1.0  AST 12*  --  13*  ALT 11  --  13  ALKPHOS 75  --  64  PROT 5.6*  --  6.0*  ALBUMIN 3.1* 2.7* 3.3*  TUMOR MARKERS: No results for input(s): AFPTM, CEA, CA199, CHROMGRNA in the last 8760 hours.  Assessment and Plan:  45 y/o F with history of ESRD on HD who is well known to IR due to multiple tunneled HD catheter interventions - most recently was new right IJ tunneled HD catheter placement 12/12/20 by Dr. Kathlene Cote - who presents today for tunneled HD catheter evaluation with possible exchange/angioplasty/new placement.  Patient has been NPO since before midnight. Procedure discussed with her DSS guardian Tamara [name] via phone today who provides verbal consent for procedure.  Risks and benefits discussed with the patient's legal guardian including, but not limited to bleeding, infection, vascular injury, pneumothorax which may require chest tube placement, air embolism or even death.  All of the patient's legal guardian's questions were answered, there is agreement to proceed.  Verbal consent obtained via phone and placed in IR control room.  Thank you for this interesting consult.  I greatly enjoyed meeting Tamara [name] and look forward to participating in their care.  A copy of this report was sent to the requesting provider on this date.  Electronically Signed: Joaquim Nam, PA-C 12/29/2020, 10:05 AM   I spent a total of  in face to face in clinical consultation, greater than 50% of which was counseling/coordinating care for tunneled HD catheter evaluation/possible exchange/new placement/angioplasty.

## 2020-12-29 NOTE — Discharge Instructions (Signed)
Vascular Access for Hemodialysis        A vascular access is a connection to the blood inside your blood vessels that allows blood to be easily removed from your body and returned to your body during kidney dialysis (hemodialysis). Hemodialysis is a procedure in which a machine outside of the body filters the blood of a person whose kidneys are no longer working properly. There are three types of vascular accesses:  Arteriovenous fistula (AVF). This is a connection between an artery and a vein (usually in the arm) that is made by sewing them together. Blood in the artery flows directly into the vein, causing it to get larger over time. This makes it easier for the vein to be used for hemodialysis. An arteriovenous fistula takes 1-6 months to develop after surgery.  Arteriovenous graft (AVG). This is a connection between an artery and a vein in the arm that is made with a tube. An arteriovenous graft can be used within 2-3 weeks of surgery.  Venous catheter. This is a thin, flexible tube that is placed in a large vein (usually in the neck, chest, or groin). A venous catheter for hemodialysis contains two tubes that come out of the skin. A venous catheter can be used right away. It is usually used as a temporary access if you need hemodialysis before a fistula or graft has developed, or if kidney failure is sudden (acute) and likely to improve without the need for long-term dialysis. It may also be used as a permanent access if a fistula or graft cannot be created. Which type of access is best for me? The type of access that is best for you depends on the size and strength of your veins, your age, and any other health problems that you have, such as diabetes. An ultrasound test may be used to look at your veins to help make this decision. A fistula is usually the preferred type of access. It can last several years and is less likely than the other types of accesses to become infected or to cause a blood  clot within a blood vessel (thrombosis). However, a fistula is not an option for everyone. If your veins are not the right size or if the fistula does not develop properly, a graft may be used instead. Grafts require you to have strong veins. If your veins are not strong enough for a graft, a catheter may be used. Catheters are more likely than fistulas and grafts to become infected or to have a thrombosis. Sometimes, only one type of access is an option. Your health care provider will help you determine which type of access is best for you. How is a vascular access used? The way that the access is used depends on the type of access:  If the access is a fistula or graft, two needles are inserted through the skin into the access before each hemodialysis session. Blood leaves the body through one of the needles and travels through a tube to the hemodialysis machine (dialyzer). Then it flows through another tube and returns to the body through the second needle.  If the access is a catheter, one tube is connected directly to the tube that leads to the dialyzer, and the other tube is connected to a tube that leads away from the dialyzer. Blood leaves the body through one tube and returns to the body through the other. What problems can occur with vascular access?  A blood clot within a blood vessel (thrombosis).   Thrombosis can lead to a narrowing of a blood vessel (stenosis). If thrombosis occurs frequently, another access site may be created as a backup.  Infection.  Heart enlargement (cardiomegaly) and heart failure. Changes in blood flow may cause an increase in blood pressure or heart rate, making your heart work harder to pump blood. These problems are most likely to occur with a venous catheter and least likely to occur with an arteriovenous fistula. How do I care for my vascular access?  Wear a medical alert bracelet. In case of an emergency, this bracelet tells health care providers that you  are a dialysis patient and allows them to care for your veins appropriately. If you have a graft or fistula:  A "bruit" is a noise that is heard with a stethoscope, and a "thrill" is a vibration that is felt over the graft or fistula. The presence of the bruit and thrill indicates that the access is working. You will be taught to feel for the thrill each day. If this is not felt, the access may be clotted. Call your health care provider.  Keep your arm straight and raised (elevated) above your heart while the access site is healing.  You may freely use the arm where your vascular access is located after the site heals. Keep the following in mind: ? Avoid pressure on the arm. ? Avoid lifting heavy objects with the arm. ? Avoid sleeping on the arm. ? Avoid wearing tight-sleeved shirts or jewelry around the graft or fistula.  Do not allow blood pressure monitoring or needle punctures on the side where the graft or fistula is located.  With permission from your health care provider, you may do exercises to help with blood flow through a fistula. These exercises involve squeezing a rubber ball or other soft objects as instructed.  Wash your access site according to directions from your health care provider. If you have a venous catheter:  Keep the insertion site clean and dry at all times.  If you are told you can shower after the site heals, use a protective covering over the catheter to keep it dry.  Follow directions from your health care provider for bandage (dressing) changes.  Ask your health care provider what activities are safe for you. You may be restricted from lifting or making repetitive arm movements on the side with the catheter. Contact a health care provider if:  Swelling around the graft or fistula gets worse.  You develop new pain.  Your catheter gets damaged. Get help right away if:  You have pain, numbness, an unusual pale skin color, or blue fingers or sores at  the tips of your fingers in the hand on the side of your fistula.  You have chills.  You have a fever.  You have pus or other fluid (drainage) at the vascular access site.  You develop skin redness or red streaking on the skin around, above, or below the vascular access.  You have bleeding at the vascular access that cannot be easily controlled.  Your catheter gets pulled out of place.  You feel your heart racing or skipping beats.  You have chest pain. Summary  A vascular access is a connection to the blood inside your blood vessels that allows blood to be easily removed from your body and returned to your body during kidney dialysis (hemodialysis).  There are three types of vascular accesses.The type of access that is best for you depends on the size and strength of your   veins, your age, and any other health problems that you have, such as diabetes.  A fistula is usually the preferred type of access, although it is not an option for everyone. It can last several years and is less likely than the other types of accesses to become infected or to cause a blood clot within a blood vessel (thrombosis).  Wear a medical alert bracelet. In case of an emergency, this tells health care providers that you are a dialysis patient. This information is not intended to replace advice given to you by your health care provider. Make sure you discuss any questions you have with your health care provider. Document Revised: 04/03/2019 Document Reviewed: 01/07/2017 Elsevier Patient Education  2020 [employer]  

## 2020-12-30 DIAGNOSIS — Z23 Encounter for immunization: Secondary | ICD-10-CM | POA: Diagnosis not present

## 2020-12-30 DIAGNOSIS — Z992 Dependence on renal dialysis: Secondary | ICD-10-CM | POA: Diagnosis not present

## 2020-12-30 DIAGNOSIS — N186 End stage renal disease: Secondary | ICD-10-CM | POA: Diagnosis not present

## 2020-12-31 ENCOUNTER — Encounter (HOSPITAL_COMMUNITY): Payer: Self-pay

## 2020-12-31 ENCOUNTER — Other Ambulatory Visit (HOSPITAL_COMMUNITY): Payer: Self-pay | Admitting: Medical

## 2020-12-31 DIAGNOSIS — N186 End stage renal disease: Secondary | ICD-10-CM

## 2021-01-01 DIAGNOSIS — Z992 Dependence on renal dialysis: Secondary | ICD-10-CM | POA: Diagnosis not present

## 2021-01-01 DIAGNOSIS — Z23 Encounter for immunization: Secondary | ICD-10-CM | POA: Diagnosis not present

## 2021-01-01 DIAGNOSIS — I10 Essential (primary) hypertension: Secondary | ICD-10-CM | POA: Diagnosis not present

## 2021-01-01 DIAGNOSIS — N186 End stage renal disease: Secondary | ICD-10-CM | POA: Diagnosis not present

## 2021-01-02 DIAGNOSIS — Z9181 History of falling: Secondary | ICD-10-CM | POA: Diagnosis not present

## 2021-01-02 DIAGNOSIS — I951 Orthostatic hypotension: Secondary | ICD-10-CM | POA: Diagnosis not present

## 2021-01-02 DIAGNOSIS — M6281 Muscle weakness (generalized): Secondary | ICD-10-CM | POA: Diagnosis not present

## 2021-01-02 DIAGNOSIS — Z043 Encounter for examination and observation following other accident: Secondary | ICD-10-CM | POA: Diagnosis not present

## 2021-01-02 DIAGNOSIS — B9689 Other specified bacterial agents as the cause of diseases classified elsewhere: Secondary | ICD-10-CM | POA: Diagnosis not present

## 2021-01-02 DIAGNOSIS — D649 Anemia, unspecified: Secondary | ICD-10-CM | POA: Diagnosis not present

## 2021-01-02 DIAGNOSIS — R1310 Dysphagia, unspecified: Secondary | ICD-10-CM | POA: Diagnosis not present

## 2021-01-05 DIAGNOSIS — N186 End stage renal disease: Secondary | ICD-10-CM | POA: Diagnosis not present

## 2021-01-05 DIAGNOSIS — R451 Restlessness and agitation: Secondary | ICD-10-CM | POA: Diagnosis not present

## 2021-01-05 DIAGNOSIS — R6250 Unspecified lack of expected normal physiological development in childhood: Secondary | ICD-10-CM | POA: Diagnosis not present

## 2021-01-05 DIAGNOSIS — Z8674 Personal history of sudden cardiac arrest: Secondary | ICD-10-CM | POA: Diagnosis not present

## 2021-01-05 DIAGNOSIS — I10 Essential (primary) hypertension: Secondary | ICD-10-CM | POA: Diagnosis not present

## 2021-01-05 DIAGNOSIS — K219 Gastro-esophageal reflux disease without esophagitis: Secondary | ICD-10-CM | POA: Diagnosis not present

## 2021-01-05 DIAGNOSIS — D631 Anemia in chronic kidney disease: Secondary | ICD-10-CM | POA: Diagnosis not present

## 2021-01-05 DIAGNOSIS — Z992 Dependence on renal dialysis: Secondary | ICD-10-CM | POA: Diagnosis not present

## 2021-01-06 DIAGNOSIS — Z23 Encounter for immunization: Secondary | ICD-10-CM | POA: Diagnosis not present

## 2021-01-06 DIAGNOSIS — N186 End stage renal disease: Secondary | ICD-10-CM | POA: Diagnosis not present

## 2021-01-06 DIAGNOSIS — Z992 Dependence on renal dialysis: Secondary | ICD-10-CM | POA: Diagnosis not present

## 2021-01-08 DIAGNOSIS — N186 End stage renal disease: Secondary | ICD-10-CM | POA: Diagnosis not present

## 2021-01-08 DIAGNOSIS — Z992 Dependence on renal dialysis: Secondary | ICD-10-CM | POA: Diagnosis not present

## 2021-01-08 DIAGNOSIS — Z23 Encounter for immunization: Secondary | ICD-10-CM | POA: Diagnosis not present

## 2021-01-09 ENCOUNTER — Encounter: Payer: Medicare Other | Admitting: Vascular Surgery

## 2021-01-09 ENCOUNTER — Encounter (HOSPITAL_COMMUNITY): Payer: Medicare Other

## 2021-01-10 DIAGNOSIS — Z992 Dependence on renal dialysis: Secondary | ICD-10-CM | POA: Diagnosis not present

## 2021-01-10 DIAGNOSIS — Z23 Encounter for immunization: Secondary | ICD-10-CM | POA: Diagnosis not present

## 2021-01-10 DIAGNOSIS — N186 End stage renal disease: Secondary | ICD-10-CM | POA: Diagnosis not present

## 2021-01-16 ENCOUNTER — Inpatient Hospital Stay (HOSPITAL_COMMUNITY)
Admission: EM | Admit: 2021-01-16 | Discharge: 2021-02-24 | DRG: 673 | Disposition: E | Payer: Medicare Other | Attending: Family Medicine | Admitting: Family Medicine

## 2021-01-16 ENCOUNTER — Encounter (HOSPITAL_COMMUNITY): Payer: Self-pay

## 2021-01-16 ENCOUNTER — Emergency Department (HOSPITAL_COMMUNITY): Payer: Medicare Other

## 2021-01-16 ENCOUNTER — Other Ambulatory Visit: Payer: Self-pay

## 2021-01-16 DIAGNOSIS — Z8674 Personal history of sudden cardiac arrest: Secondary | ICD-10-CM

## 2021-01-16 DIAGNOSIS — D649 Anemia, unspecified: Secondary | ICD-10-CM | POA: Diagnosis not present

## 2021-01-16 DIAGNOSIS — Z743 Need for continuous supervision: Secondary | ICD-10-CM | POA: Diagnosis not present

## 2021-01-16 DIAGNOSIS — E876 Hypokalemia: Secondary | ICD-10-CM | POA: Diagnosis not present

## 2021-01-16 DIAGNOSIS — R6250 Unspecified lack of expected normal physiological development in childhood: Secondary | ICD-10-CM | POA: Diagnosis present

## 2021-01-16 DIAGNOSIS — T82598A Other mechanical complication of other cardiac and vascular devices and implants, initial encounter: Secondary | ICD-10-CM | POA: Diagnosis not present

## 2021-01-16 DIAGNOSIS — U071 COVID-19: Secondary | ICD-10-CM | POA: Diagnosis not present

## 2021-01-16 DIAGNOSIS — M7981 Nontraumatic hematoma of soft tissue: Secondary | ICD-10-CM | POA: Diagnosis not present

## 2021-01-16 DIAGNOSIS — G9341 Metabolic encephalopathy: Secondary | ICD-10-CM

## 2021-01-16 DIAGNOSIS — R579 Shock, unspecified: Secondary | ICD-10-CM

## 2021-01-16 DIAGNOSIS — T68XXXA Hypothermia, initial encounter: Secondary | ICD-10-CM

## 2021-01-16 DIAGNOSIS — E872 Acidosis: Secondary | ICD-10-CM | POA: Diagnosis not present

## 2021-01-16 DIAGNOSIS — E162 Hypoglycemia, unspecified: Secondary | ICD-10-CM | POA: Diagnosis present

## 2021-01-16 DIAGNOSIS — T8249XA Other complication of vascular dialysis catheter, initial encounter: Principal | ICD-10-CM | POA: Diagnosis present

## 2021-01-16 DIAGNOSIS — D696 Thrombocytopenia, unspecified: Secondary | ICD-10-CM

## 2021-01-16 DIAGNOSIS — R680 Hypothermia, not associated with low environmental temperature: Secondary | ICD-10-CM | POA: Diagnosis not present

## 2021-01-16 DIAGNOSIS — Z4682 Encounter for fitting and adjustment of non-vascular catheter: Secondary | ICD-10-CM | POA: Diagnosis not present

## 2021-01-16 DIAGNOSIS — T829XXA Unspecified complication of cardiac and vascular prosthetic device, implant and graft, initial encounter: Secondary | ICD-10-CM

## 2021-01-16 DIAGNOSIS — F419 Anxiety disorder, unspecified: Secondary | ICD-10-CM | POA: Diagnosis present

## 2021-01-16 DIAGNOSIS — R233 Spontaneous ecchymoses: Secondary | ICD-10-CM | POA: Diagnosis present

## 2021-01-16 DIAGNOSIS — K861 Other chronic pancreatitis: Secondary | ICD-10-CM | POA: Diagnosis present

## 2021-01-16 DIAGNOSIS — R112 Nausea with vomiting, unspecified: Secondary | ICD-10-CM

## 2021-01-16 DIAGNOSIS — F32A Depression, unspecified: Secondary | ICD-10-CM | POA: Diagnosis present

## 2021-01-16 DIAGNOSIS — Z66 Do not resuscitate: Secondary | ICD-10-CM | POA: Diagnosis not present

## 2021-01-16 DIAGNOSIS — R4189 Other symptoms and signs involving cognitive functions and awareness: Secondary | ICD-10-CM | POA: Diagnosis present

## 2021-01-16 DIAGNOSIS — Y712 Prosthetic and other implants, materials and accessory cardiovascular devices associated with adverse incidents: Secondary | ICD-10-CM | POA: Diagnosis present

## 2021-01-16 DIAGNOSIS — Z789 Other specified health status: Secondary | ICD-10-CM | POA: Diagnosis present

## 2021-01-16 DIAGNOSIS — I120 Hypertensive chronic kidney disease with stage 5 chronic kidney disease or end stage renal disease: Secondary | ICD-10-CM | POA: Diagnosis present

## 2021-01-16 DIAGNOSIS — J189 Pneumonia, unspecified organism: Secondary | ICD-10-CM

## 2021-01-16 DIAGNOSIS — I9589 Other hypotension: Secondary | ICD-10-CM | POA: Diagnosis present

## 2021-01-16 DIAGNOSIS — Z8711 Personal history of peptic ulcer disease: Secondary | ICD-10-CM

## 2021-01-16 DIAGNOSIS — Z882 Allergy status to sulfonamides status: Secondary | ICD-10-CM

## 2021-01-16 DIAGNOSIS — J1282 Pneumonia due to coronavirus disease 2019: Secondary | ICD-10-CM | POA: Diagnosis present

## 2021-01-16 DIAGNOSIS — Z992 Dependence on renal dialysis: Secondary | ICD-10-CM

## 2021-01-16 DIAGNOSIS — D65 Disseminated intravascular coagulation [defibrination syndrome]: Secondary | ICD-10-CM

## 2021-01-16 DIAGNOSIS — Z79899 Other long term (current) drug therapy: Secondary | ICD-10-CM

## 2021-01-16 DIAGNOSIS — E039 Hypothyroidism, unspecified: Secondary | ICD-10-CM

## 2021-01-16 DIAGNOSIS — I252 Old myocardial infarction: Secondary | ICD-10-CM

## 2021-01-16 DIAGNOSIS — N186 End stage renal disease: Secondary | ICD-10-CM | POA: Diagnosis not present

## 2021-01-16 DIAGNOSIS — Z95828 Presence of other vascular implants and grafts: Secondary | ICD-10-CM

## 2021-01-16 DIAGNOSIS — K219 Gastro-esophageal reflux disease without esophagitis: Secondary | ICD-10-CM | POA: Diagnosis present

## 2021-01-16 DIAGNOSIS — Z452 Encounter for adjustment and management of vascular access device: Secondary | ICD-10-CM | POA: Diagnosis not present

## 2021-01-16 DIAGNOSIS — D631 Anemia in chronic kidney disease: Secondary | ICD-10-CM | POA: Diagnosis present

## 2021-01-16 DIAGNOSIS — M109 Gout, unspecified: Secondary | ICD-10-CM | POA: Diagnosis present

## 2021-01-16 DIAGNOSIS — Z8249 Family history of ischemic heart disease and other diseases of the circulatory system: Secondary | ICD-10-CM

## 2021-01-16 DIAGNOSIS — E0781 Sick-euthyroid syndrome: Secondary | ICD-10-CM | POA: Diagnosis not present

## 2021-01-16 DIAGNOSIS — Z515 Encounter for palliative care: Secondary | ICD-10-CM

## 2021-01-16 DIAGNOSIS — E7800 Pure hypercholesterolemia, unspecified: Secondary | ICD-10-CM | POA: Diagnosis present

## 2021-01-16 DIAGNOSIS — Z880 Allergy status to penicillin: Secondary | ICD-10-CM

## 2021-01-16 DIAGNOSIS — R404 Transient alteration of awareness: Secondary | ICD-10-CM | POA: Diagnosis not present

## 2021-01-16 HISTORY — DX: Acute pancreatitis without necrosis or infection, unspecified: K85.90

## 2021-01-16 HISTORY — DX: COVID-19: U07.1

## 2021-01-16 HISTORY — DX: Anemia in chronic kidney disease: D63.1

## 2021-01-16 HISTORY — DX: Tachycardia, unspecified: R00.0

## 2021-01-16 HISTORY — DX: Hypomagnesemia: E83.42

## 2021-01-16 HISTORY — DX: Disorder of kidney and ureter, unspecified: N28.9

## 2021-01-16 HISTORY — DX: Gout, unspecified: M10.9

## 2021-01-16 HISTORY — DX: Cardiac arrhythmia, unspecified: I49.9

## 2021-01-16 HISTORY — DX: Personal history of sudden cardiac arrest: Z86.74

## 2021-01-16 HISTORY — DX: Major depressive disorder, single episode, unspecified: F32.9

## 2021-01-16 HISTORY — DX: Hyperlipidemia, unspecified: E78.5

## 2021-01-16 HISTORY — DX: Gastro-esophageal reflux disease without esophagitis: K21.9

## 2021-01-16 HISTORY — DX: Unspecified psychosis not due to a substance or known physiological condition: F29

## 2021-01-16 HISTORY — DX: Dependence on renal dialysis: Z99.2

## 2021-01-16 HISTORY — DX: Generalized anxiety disorder: F41.1

## 2021-01-16 HISTORY — DX: Pure hypercholesterolemia, unspecified: E78.00

## 2021-01-16 HISTORY — DX: Chronic kidney disease, unspecified: N18.9

## 2021-01-16 HISTORY — DX: Pseudocyst of pancreas: K86.3

## 2021-01-16 LAB — PREPARE RBC (CROSSMATCH)

## 2021-01-16 LAB — BASIC METABOLIC PANEL
Anion gap: 10 (ref 5–15)
BUN: 22 mg/dL — ABNORMAL HIGH (ref 6–20)
CO2: 20 mmol/L — ABNORMAL LOW (ref 22–32)
Calcium: 7.3 mg/dL — ABNORMAL LOW (ref 8.9–10.3)
Chloride: 103 mmol/L (ref 98–111)
Creatinine, Ser: 5.57 mg/dL — ABNORMAL HIGH (ref 0.44–1.00)
GFR, Estimated: 9 mL/min — ABNORMAL LOW (ref >60)
Glucose, Bld: 77 mg/dL (ref 70–99)
Potassium: 2.8 mmol/L — ABNORMAL LOW (ref 3.5–5.1)
Sodium: 133 mmol/L — ABNORMAL LOW (ref 135–145)

## 2021-01-16 LAB — CBC WITH DIFFERENTIAL/PLATELET
Abs Immature Granulocytes: 0.03 10*3/uL (ref 0.00–0.07)
Basophils Absolute: 0 10*3/uL (ref 0.0–0.1)
Basophils Relative: 0 %
Eosinophils Absolute: 0 10*3/uL (ref 0.0–0.5)
Eosinophils Relative: 0 %
HCT: 18 % — ABNORMAL LOW (ref 36.0–46.0)
Hemoglobin: 6 g/dL — CL (ref 12.0–15.0)
Immature Granulocytes: 0 %
Lymphocytes Relative: 47 %
Lymphs Abs: 3.8 10*3/uL (ref 0.7–4.0)
MCH: 28 pg (ref 26.0–34.0)
MCHC: 33.3 g/dL (ref 30.0–36.0)
MCV: 84.1 fL (ref 80.0–100.0)
Monocytes Absolute: 0.3 10*3/uL (ref 0.1–1.0)
Monocytes Relative: 4 %
Neutro Abs: 4 10*3/uL (ref 1.7–7.7)
Neutrophils Relative %: 49 %
Platelets: 134 10*3/uL — ABNORMAL LOW (ref 150–400)
RBC: 2.14 MIL/uL — ABNORMAL LOW (ref 3.87–5.11)
RDW: 19.7 % — ABNORMAL HIGH (ref 11.5–15.5)
WBC: 8.2 10*3/uL (ref 4.0–10.5)
nRBC: 0.5 % — ABNORMAL HIGH (ref 0.0–0.2)

## 2021-01-16 LAB — POC SARS CORONAVIRUS 2 AG -  ED
SARS Coronavirus 2 Ag: POSITIVE — AB
SARS Coronavirus 2 Ag: POSITIVE — AB

## 2021-01-16 LAB — SARS CORONAVIRUS 2 BY RT PCR (HOSPITAL ORDER, PERFORMED IN ~~LOC~~ HOSPITAL LAB): SARS Coronavirus 2: POSITIVE — AB

## 2021-01-16 LAB — ABO/RH: ABO/RH(D): O POS

## 2021-01-16 MED ORDER — ALTEPLASE 2 MG IJ SOLR
3.2000 mg | Freq: Once | INTRAMUSCULAR | Status: AC
  Administered 2021-01-16: 3.2 mg

## 2021-01-16 MED ORDER — ALTEPLASE 2 MG IJ SOLR
INTRAMUSCULAR | Status: AC
  Filled 2021-01-16: qty 4

## 2021-01-16 MED ORDER — SODIUM CHLORIDE 0.9 % IV SOLN
10.0000 mL/h | Freq: Once | INTRAVENOUS | Status: AC
  Administered 2021-01-17: 10 mL/h via INTRAVENOUS

## 2021-01-16 NOTE — ED Notes (Signed)
Date and time results received: 01/26/2021 10:35 PM(use smartphrase ".now" to insert current time)  Test: covid Critical Value: positive  Name of Provider Notified: Dr Waldron Labs  Orders Received? Or Actions Taken?: see chart

## 2021-01-16 NOTE — ED Notes (Signed)
Assisted PA with rectal exam

## 2021-01-16 NOTE — ED Triage Notes (Signed)
Pt brought to ED via [facility] for clogged dialysis port per facility. Pt from [facility]

## 2021-01-16 NOTE — ED Notes (Signed)
25ml of urine per bladder scanner

## 2021-01-16 NOTE — ED Notes (Signed)
Date and time results received: 01/18/2021 1818 (use smartphrase ".now" to insert current time)  Test: Hgb Critical Value: 6.0  Name of Provider Notified: Karle Starch, MD  Orders Received? Or Actions Taken?:

## 2021-01-16 NOTE — H&P (Addendum)
TRH H&P   Patient Demographics:    Tamara [name], is a 45 y.o. female  MRN: [id number]   DOB - Jul 18, 1976  Admit Date - 01/15/2021  Outpatient Primary MD for the patient is Hal Morales, DO  Referring MD/NP/PA: PA HArris    Patient coming from: HD center  Chief Complaint  Patient presents with  . Vascular Access Problem      HPI:    Tamara [name]  is a 45 y.o. female, with past medical history of end-stage renal disease on hemodialysis, diminished mental capacity/developmental delay, history of hypertension, history of COVID with ARDS in February 2021, depression, anxiety, patient was sent in for evaluation of clogged hemodialysis catheter, patient had permacath in right subclavian area, and she was diagnosed with COVID, so her schedule has been altered for COVID dialysis days, patient herself cannot provide any history, history was obtained from ED staff, currently patient did not have a full dialysis session over 2 weeks, and apparently her dialysis catheter was clogged today during dialysis, so she could not be dialyzed, so she was not sent to ED for evaluation. - in ED she is in no respiratory distress, no evidence of volume overload, count for potassium of 2.8, BUN of 22, and a hemoglobin of 6, she is Hemoccult negative, [facility] hospitalist consulted to admit.    Review of systems:    Unable to perform appropriate review of system due to her underlying psychiatric disorder.  With Past History of the following :    Past Medical History:  Diagnosis Date  . Abnormal uterine bleeding (AUB) 01/26/2016  . Acute pancreatitis without necrosis or infection, unspecified   . Anemia in chronic kidney disease (CKD)   . Anxiety and depression    mentally slow  . Cardiac arrhythmia   . Chronic abdominal pain   . COVID-19   . Dependence on renal dialysis ([place])   . Depression    . Gastro-esophageal reflux disease without esophagitis   . Generalized anxiety disorder   . Gout   . Hematuria 01/26/2016  . Hyperlipidemia   . Hypertension   . Hypomagnesemia   . Leg pain   . Major depressive disorder   . Obesity   . Ovarian cyst 02/10/2016  . Personal history of sudden cardiac arrest   . Pseudocyst of pancreas   . PUD (peptic ulcer disease)   . Pure hypercholesterolemia   . Reflux   . Renal disorder   . S/P colonoscopy 03/15/11   friable anal canal  . Sebaceous cyst of labia 01/26/2016  . Tachycardia   . Unspecified psychosis not due to a substance or known physiological condition ([place])   . Yeast vaginitis 09/19/2013      Past Surgical History:  Procedure Laterality Date  . IR FLUORO GUIDE CV LINE RIGHT  12/05/2020  . IR FLUORO GUIDE CV LINE RIGHT  12/12/2020  . IR  FLUORO GUIDE CV LINE RIGHT  12/29/2020  . IR INTRAVASCULAR ULTRASOUND NON CORONARY  12/29/2020  . IR PTA VENOUS ADDL EXCEPT DIALYSIS CIRCUIT  12/29/2020  . IR PTA VENOUS EXCEPT DIALYSIS CIRCUIT  12/05/2020  . IR US GUIDE VASC ACCESS RIGHT  12/12/2020  . TOOTH EXTRACTION N/A 06/03/2014   Procedure: EXTRACTION WISDOM TEETH;  Surgeon: Gae Bon, DDS;  Location: [facility];  Service: Oral Surgery;  Laterality: N/A;  . TUBAL LIGATION        Social History:     Social History   Tobacco Use  . Smoking status: Never Smoker  . Smokeless tobacco: Never Used  Substance Use Topics  . Alcohol use: No       Family History :     Family History  Problem Relation Age of Onset  . Colon polyps Father   . Hypertension Father   . Diabetes Father   . Seizures Mother   . Hypertension Mother   . Diabetes Mother   . Diabetes Paternal Grandmother   . Colon cancer Neg Hx       Home Medications:   Prior to Admission medications   Medication Sig Start Date End Date Taking? Authorizing Provider  allopurinol (ZYLOPRIM) 300 MG tablet Take 300 mg by mouth daily.  09/05/13  Yes [provider]   ARIPiprazole (ABILIFY) 5 MG tablet Take 5 mg by mouth daily.   Yes [provider]  dicyclomine (BENTYL) 10 MG capsule Take 10 mg by mouth 4 (four) times daily -  before meals and at bedtime.   Yes [provider]  hydrOXYzine (VISTARIL) 25 MG capsule Take 25 mg by mouth 2 (two) times daily.   Yes [provider]  omeprazole (PRILOSEC) 20 MG capsule Take 20 mg by mouth daily.   Yes [provider]  ondansetron (ZOFRAN) 4 MG tablet Take 4 mg by mouth every 8 (eight) hours as needed. 12/19/20  Yes [provider]  Pancrelipase, Lip-Prot-Amyl, (CREON) 24000-76000 units CPEP Take 2 capsules by mouth in the morning, at noon, in the evening, and at bedtime.   Yes [provider]  polyethylene glycol powder (GLYCOLAX/MIRALAX) 17 GM/SCOOP powder Take 0.5 Containers by mouth.   Yes [provider]  sertraline (ZOLOFT) 100 MG tablet Take 100 mg by mouth daily. 01/15/21  Yes [provider]  acetaminophen (TYLENOL) 325 MG tablet Take 650 mg by mouth 4 (four) times daily as needed for moderate pain.     [provider]  ALPRAZolam Duanne Moron) 0.5 MG tablet Take 0.5 mg by mouth 3 (three) times daily as needed. Anxiety    [provider]  amLODIPine Besy-Benazepril HCl (LOTREL PO) Take 1 capsule by mouth daily. Patient not taking: No sig reported    [provider]  cetirizine (ZYRTEC) 10 MG tablet Take 10 mg by mouth daily.    [provider]  chlorthalidone (HYGROTON) 25 MG tablet Take 25 mg by mouth daily.    [provider]  cloNIDine (CATAPRES) 0.2 MG tablet Take 0.2 mg by mouth 3 (three) times daily.    [provider]  COLCRYS 0.6 MG tablet Take 0.6 mg by mouth 2 (two) times daily.  09/15/13   [provider]  colestipol (COLESTID) 1 g tablet Take 1 g by mouth in the morning, at noon, and at bedtime.    [provider]  diclofenac Sodium (VOLTAREN) 1 % GEL Apply  topically. 10/23/20   [provider]  doxycycline (VIBRAMYCIN)  100 MG capsule Take 100 mg by mouth 2 (two) times daily. 11/19/20   [provider]  DULoxetine (CYMBALTA) 60 MG capsule Take 60 mg by mouth at bedtime.    [provider]  epoetin alfa (EPOGEN) 4000 UNIT/ML injection Inject into the skin.    [provider]  furosemide (LASIX) 40 MG tablet Take 40 mg by mouth daily.    [provider]  HYDROcodone-acetaminophen (NORCO/VICODIN) 5-325 MG per tablet Take 1 tablet by mouth 4 (four) times daily.  12/13/14   [provider]  lactulose (CHRONULAC) 10 GM/15ML solution Take 20 g by mouth at bedtime.    [provider]  medroxyPROGESTERone (PROVERA) 10 MG tablet TAKE 1 TABLET ONCE DAILY FOR 2 WEEKS. REPEAT EVERY 3 MONTHS Patient taking differently: Take 10 mg by mouth See admin instructions. Take 1 tablet for 2 weeks, repeat every 3 months 12/23/19   [name], MD  Melatonin CR 3 MG TBCR Take by mouth.    [provider]  metoCLOPramide (REGLAN) 5 MG tablet Take 5 mg by mouth 3 (three) times daily as needed for nausea or vomiting.  09/03/20   [provider]  metoprolol (LOPRESSOR) 100 MG tablet Take 100 mg by mouth 2 (two) times daily.  11/29/14   [provider]  metroNIDAZOLE (FLAGYL) 500 MG tablet Take 500 mg by mouth 2 (two) times daily. 11/19/20   [provider]  midodrine (PROAMATINE) 10 MG tablet Take 10 mg by mouth 3 (three) times a week.    [provider]  mirtazapine (REMERON) 15 MG tablet Take 15 mg by mouth at bedtime. 08/27/20   [provider]  ondansetron (ZOFRAN-ODT) 4 MG disintegrating tablet Take 4 mg by mouth every 8 (eight) hours as needed for nausea or vomiting. Patient not taking: No sig reported    [provider]  oxyCODONE-acetaminophen (PERCOCET/ROXICET) 5-325 MG tablet Take 2 tablets by mouth every 6 (six) hours as needed for moderate pain.   09/02/20   [provider]     Allergies:     Allergies  Allergen Reactions  . Penicillins   . Sulfa Antibiotics      Physical Exam:   Vitals  Blood pressure 135/83, pulse 73, temperature 97.8 F (36.6 C), temperature source Axillary, resp. rate 16, height 5\' 5"  (1.651 m), weight 70 kg, SpO2 100 %.   1. General well-developed female, laying in bed, no apparent distress  2. significantly diminished cognition and insight, awake, alert, pleasant  3. No F.N deficits, ALL C.Nerves Intact, Strength 5/5 all 4 extremities, Sensation intact all 4 extremities, Plantars down going.  4. Ears and Eyes appear Normal, Conjunctivae clear, PERRLA. Moist Oral Mucosa.  5. Supple Neck, No JVD, No cervical lymphadenopathy appriciated, No Carotid Bruits.  6. Symmetrical Chest wall movement, Good air movement bilaterally, CTAB.  7. RRR, No Gallops, Rubs or Murmurs, No Parasternal Heave.  8. Positive Bowel Sounds, Abdomen Soft, No tenderness, No organomegaly appriciated,No rebound -guarding or rigidity.  9.  No Cyanosis, Normal Skin Turgor, No Skin Rash or Bruise.  10. Good muscle tone,  joints appear normal , no effusions, Normal ROM.  11. No Palpable Lymph Nodes in Neck or Axillae    Data Review:    CBC Recent Labs  Lab 01/09/2021 1732  WBC 8.2  HGB 6.0*  HCT 18.0*  PLT 134*  MCV 84.1  MCH 28.0  MCHC 33.3  RDW 19.7*  LYMPHSABS 3.8  MONOABS 0.3  EOSABS 0.0  BASOSABS 0.0   ------------------------------------------------------------------------------------------------------------------  Chemistries  Recent Labs  Lab 01/04/2021 1631  NA 133*  K 2.8*  CL 103  CO2 20*  GLUCOSE 77  BUN 22*  CREATININE 5.57*  CALCIUM 7.3*   ------------------------------------------------------------------------------------------------------------------ estimated creatinine clearance is 12.8 mL/min (A) (by C-G formula based on SCr of 5.57 mg/dL  (H)). ------------------------------------------------------------------------------------------------------------------ No results for input(s): TSH, T4TOTAL, T3FREE, THYROIDAB in the last 72 hours.  Invalid input(s): FREET3  Coagulation profile No results for input(s): INR, PROTIME in the last 168 hours. ------------------------------------------------------------------------------------------------------------------- No results for input(s): DDIMER in the last 72 hours. -------------------------------------------------------------------------------------------------------------------  Cardiac Enzymes No results for input(s): CKMB, TROPONINI, MYOGLOBIN in the last 168 hours.  Invalid input(s): CK ------------------------------------------------------------------------------------------------------------------ No results found for: BNP   ---------------------------------------------------------------------------------------------------------------  Urinalysis    Component Value Date/Time   COLORURINE AMBER (A) 09/05/2020 1755   APPEARANCEUR CLOUDY (A) 09/05/2020 1755   APPEARANCEUR Clear 09/16/2016 1640   LABSPEC 1.016 09/05/2020 1755   PHURINE 5.0 09/05/2020 [place] 09/05/2020 1755   HGBUR NEGATIVE 09/05/2020 1755   BILIRUBINUR NEGATIVE 09/05/2020 1755   BILIRUBINUR Negative 09/16/2016 1640   KETONESUR NEGATIVE 09/05/2020 1755   PROTEINUR 30 (A) 09/05/2020 1755   UROBILINOGEN 0.2 01/14/2010 1638   NITRITE NEGATIVE 09/05/2020 1755   LEUKOCYTESUR LARGE (A) 09/05/2020 1755    ----------------------------------------------------------------------------------------------------------------   Imaging Results:    DG Chest Port 1 View  Result Date: 12/28/2020 CLINICAL DATA:  Evaluate dialysis catheter EXAM: PORTABLE CHEST 1 VIEW COMPARISON:  11/16/2020 FINDINGS: Lungs are clear.  No pleural effusion or pneumothorax. The heart is normal in size. Right IJ dual  lumen dialysis catheter with its distal tip at the cavoatrial junction. IMPRESSION: Right IJ dual lumen dialysis catheter with its distal tip at the cavoatrial junction. Electronically Signed   By: Julian Hy M.D.   On: 12/28/2020 16:51      Assessment & Plan:    Active Problems:   ESRD (end stage renal disease) (HCC)   Symptomatic anemia   Symptomatic anemia -Hemoglobin of 6, she is Hemoccult negative, this is most likely anemia in the setting of end-stage renal disease, she was ordered 2 units PRBC transfusion. -Procrit per renal  ESRD, HD access problem -No indication for urgent hemodialysis, as no evidence of volume overload, no hyperkalemia or uremic symptoms. -Renal consulted by ED, they plan to dialyze here at [facility] - Cathflow was instilled in both lumens for overnight dwell, no indication to transfer to [facility] at this point as discussed with Dr. Royce Macadamia.  Hypokalemia - should improve with blood transfusion as ED discussed with renal, if remains low in a.m. will be managed with dialysis.  Chronic pancreatitis -Continue with Creon  Hypertension -Currently blood pressure is acceptable, will resume back on metoprolol blood pressure started to increase  COVID 19 infection -She is asymptomatic, per records tomorrow is last day of isolation for her, currently no indication for treatment.    DVT Prophylaxis - SCDs  AM Labs Ordered, also please review Full Orders  Family Communication:  (left message for her legal guardian, called back by social services on call, and updated them about her condition)  Code Status Full, as documented from SNF records  Likely DC to  SNF  Condition GUARDED    Consults called: renal    Admission status: inpatient    Time spent in minutes : 50 minutes   Phillips Climes M.D on 01/15/2021 at 8:55 PM   [facility] -  Office  [phone]

## 2021-01-16 NOTE — ED Provider Notes (Signed)
[facility] EMERGENCY DEPARTMENT Provider Note   CSN: [id number] Arrival date & time: 01/08/2021  1218     History Chief Complaint  Patient presents with  . Vascular Access Problem    Tamara [name] is a 45 y.o. female.  With a past medical history of end-stage renal disease on dialysis, diminished mental capacity.  She is sent in for evaluation of clogged dialysis catheter which is in place in the right subclavian region.  History gathered by phone call with her facility.  They relayed to the nursing staff that the patient had half session of dialysis "the other day."  She has not had a full dialysis session in over 2 weeks. They report that she has the capacity of her 16 or 44-year-old and that she frequently tugs at the machinery or pulls it off of her during sessions preventing her to get a full dialysis session.  Currently her dialysis catheter was closed today and she was sent to the emergency department for further assistance.  No other complaints at this time.  HPI     Past Medical History:  Diagnosis Date  . Abnormal uterine bleeding (AUB) 01/26/2016  . Acute pancreatitis without necrosis or infection, unspecified   . Anemia in chronic kidney disease (CKD)   . Anxiety and depression    mentally slow  . Cardiac arrhythmia   . Chronic abdominal pain   . COVID-19   . Dependence on renal dialysis ([place])   . Depression   . Gastro-esophageal reflux disease without esophagitis   . Generalized anxiety disorder   . Gout   . Hematuria 01/26/2016  . Hyperlipidemia   . Hypertension   . Hypomagnesemia   . Leg pain   . Major depressive disorder   . Obesity   . Ovarian cyst 02/10/2016  . Personal history of sudden cardiac arrest   . Pseudocyst of pancreas   . PUD (peptic ulcer disease)   . Pure hypercholesterolemia   . Reflux   . Renal disorder   . S/P colonoscopy 03/15/11   friable anal canal  . Sebaceous cyst of labia 01/26/2016  . Tachycardia   . Unspecified psychosis not due  to a substance or known physiological condition ([place])   . Yeast vaginitis 09/19/2013    Patient Active Problem List   Diagnosis Date Noted  . Bacterial vaginosis 11/18/2020  . Tachycardia 11/17/2020  . Acute pancreatitis 09/06/2020  . Acute on chronic pancreatitis ([place]) 09/05/2020  . ESRD (end stage renal disease) ([place]) 09/05/2020  . Orthostatic hypotension 09/05/2020  . Recurrent syncope 09/05/2020  . Hypokalemia 09/05/2020  . Asymptomatic bacteriuria 09/05/2020  . Muscle weakness (generalized) 05/14/2020  . Non-traumatic rhabdomyolysis 02/26/2020  . Type 2 MI (myocardial infarction) ([place]) 02/26/2020  . Acute renal failure (ARF) ([place]) 02/10/2020  . Cardiac arrest ([place]) 02/10/2020  . Torsades de pointes ([place]) 02/10/2020  . Acute respiratory distress syndrome (ARDS) due to COVID-19 virus ([place]) 02/01/2020  . AKI (acute kidney injury) ([place]) 02/01/2020  . Developmental delay 02/01/2020  . Ovarian cyst 02/10/2016  . Abnormal uterine bleeding (AUB) 01/26/2016  . Hematuria 01/26/2016  . Sebaceous cyst of labia 01/26/2016  . Yeast vaginitis 09/19/2013  . Obesity 07/16/2011  . RECTAL BLEEDING 02/23/2011  . Abdominal pain 10/05/2010  . HYPERTENSION NEC 09/28/2010    Past Surgical History:  Procedure Laterality Date  . IR FLUORO GUIDE CV LINE RIGHT  12/05/2020  . IR FLUORO GUIDE CV LINE RIGHT  12/12/2020  . IR FLUORO GUIDE CV  LINE RIGHT  12/29/2020  . IR INTRAVASCULAR ULTRASOUND NON CORONARY  12/29/2020  . IR PTA VENOUS ADDL EXCEPT DIALYSIS CIRCUIT  12/29/2020  . IR PTA VENOUS EXCEPT DIALYSIS CIRCUIT  12/05/2020  . IR US GUIDE VASC ACCESS RIGHT  12/12/2020  . TOOTH EXTRACTION N/A 06/03/2014   Procedure: EXTRACTION WISDOM TEETH;  Surgeon: Gae Bon, DDS;  Location: [facility];  Service: Oral Surgery;  Laterality: N/A;  . TUBAL LIGATION       OB History    Gravida      Para      Term      Preterm      AB      Living  0     SAB      IAB      Ectopic      Multiple       Live Births              Family History  Problem Relation Age of Onset  . Colon polyps Father   . Hypertension Father   . Diabetes Father   . Seizures Mother   . Hypertension Mother   . Diabetes Mother   . Diabetes Paternal Grandmother   . Colon cancer Neg Hx     Social History   Tobacco Use  . Smoking status: Never Smoker  . Smokeless tobacco: Never Used  Vaping Use  . Vaping Use: Never used  Substance Use Topics  . Alcohol use: No  . Drug use: No    Home Medications Prior to Admission medications   Medication Sig Start Date End Date Taking? Authorizing Provider  acetaminophen (TYLENOL) 325 MG tablet Take 650 mg by mouth 4 (four) times daily as needed for moderate pain.     [provider]  allopurinol (ZYLOPRIM) 300 MG tablet Take 300 mg by mouth daily.  09/05/13   [provider]  ALPRAZolam Duanne Moron) 0.5 MG tablet Take 0.5 mg by mouth 3 (three) times daily as needed. Anxiety    [provider]  amLODIPine Besy-Benazepril HCl (LOTREL PO) Take 1 capsule by mouth daily.    [provider]  ARIPiprazole (ABILIFY) 5 MG tablet Take 5 mg by mouth daily.    [provider]  cetirizine (ZYRTEC) 10 MG tablet Take 10 mg by mouth daily.    [provider]  chlorthalidone (HYGROTON) 25 MG tablet Take 25 mg by mouth daily.    [provider]  cloNIDine (CATAPRES) 0.2 MG tablet Take 0.2 mg by mouth 3 (three) times daily.    [provider]  COLCRYS 0.6 MG tablet Take 0.6 mg by mouth 2 (two) times daily.  09/15/13   [provider]  colestipol (COLESTID) 1 g tablet Take 1 g by mouth in the morning, at noon, and at bedtime.    [provider]  dicyclomine (BENTYL) 10 MG capsule Take 10 mg by mouth 4 (four) times daily -  before meals and at bedtime.    [provider]  DULoxetine (CYMBALTA) 60 MG capsule Take 60 mg by mouth at bedtime.    [provider]  furosemide (LASIX) 40 MG  tablet Take 40 mg by mouth daily.    [provider]  HYDROcodone-acetaminophen (NORCO/VICODIN) 5-325 MG per tablet Take 1 tablet by mouth 4 (four) times daily.  12/13/14   [provider]  hydrOXYzine (VISTARIL) 25 MG capsule Take 25 mg by mouth 3 (three) times daily as needed.    [provider]  lactulose (CHRONULAC) 10 GM/15ML solution Take 20 g by mouth at bedtime.    [provider]  medroxyPROGESTERone (PROVERA) 10 MG tablet TAKE 1 TABLET ONCE DAILY FOR 2 WEEKS. REPEAT EVERY 3 MONTHS Patient taking differently: Take 10 mg by mouth See admin instructions. Take 1 tablet for 2 weeks, repeat every 3 months 12/23/19   [name], MD  Melatonin CR (MELADOX) 3 MG TBCR Take by mouth.    [provider]  metoCLOPramide (REGLAN) 5 MG tablet Take 5 mg by mouth 3 (three) times daily as needed for nausea or vomiting.  09/03/20   [provider]  metoprolol (LOPRESSOR) 100 MG tablet Take 100 mg by mouth 2 (two) times daily.  11/29/14   [provider]  midodrine (PROAMATINE) 10 MG tablet Take 10 mg by mouth 3 (three) times a week.    [provider]  mirtazapine (REMERON) 15 MG tablet Take 15 mg by mouth at bedtime. 08/27/20   [provider]  omeprazole (PRILOSEC) 20 MG capsule Take 20 mg by mouth daily.    [provider]  ondansetron (ZOFRAN-ODT) 4 MG disintegrating tablet Take 4 mg by mouth every 8 (eight) hours as needed for nausea or vomiting.    [provider]  oxyCODONE-acetaminophen (PERCOCET/ROXICET) 5-325 MG tablet Take 2 tablets by mouth every 6 (six) hours as needed for moderate pain.  09/02/20   [provider]  Pancrelipase, Lip-Prot-Amyl, (CREON) 24000-76000 units CPEP Take 1 capsule by mouth in the morning, at noon, in the evening, and at bedtime.     [provider]    Allergies    Penicillins and Sulfa antibiotics  Review of Systems   Review of Systems  Unable to  perform ROS: Psychiatric disorder    Physical Exam Updated Vital Signs BP 136/80   Pulse 73   Temp 97.8 F (36.6 C) (Axillary)   Resp 18   Ht 5\' 5"  (1.651 m)   Wt 70 kg   SpO2 100%   BMI 25.68 kg/m   Physical Exam Vitals and nursing note reviewed.  Constitutional:      General: She is not in acute distress.    Appearance: She is well-developed and well-nourished. She is not diaphoretic.  HENT:     Head: Normocephalic and atraumatic.  Eyes:     General: No scleral icterus.    Conjunctiva/sclera: Conjunctivae normal.  Cardiovascular:     Rate and Rhythm: Normal rate and regular rhythm.     Heart sounds: Normal heart sounds. No murmur heard. No friction rub. No gallop.   Pulmonary:     Effort: Pulmonary effort is normal. No respiratory distress.     Breath sounds: Normal breath sounds.  Chest:    Abdominal:     General: Bowel sounds are normal. There is no distension.     Palpations: Abdomen is soft. There is no mass.     Tenderness: There is no abdominal tenderness. There is no guarding.  Musculoskeletal:     Cervical back: Normal range of motion.  Skin:    General: Skin is warm and dry.  Neurological:     Mental Status: She is alert and oriented to person, place, and time.  Psychiatric:        Behavior: Behavior normal.     ED Results / Procedures / Treatments   Labs (all labs ordered are listed, but only abnormal results are displayed) Labs Reviewed  BASIC METABOLIC PANEL  CBC WITH  DIFFERENTIAL/PLATELET    EKG None  Radiology No results found.  Procedures Procedures (including critical care time)  Medications Ordered in ED Medications - No data to display  ED Course  I have reviewed the triage vital signs and the nursing notes.  Pertinent labs & imaging results that were available during my care of the patient were reviewed by me and considered in my medical decision making (see chart for details).  Clinical Course as of 01/15/2021 2117  Fri  Jan 16, 2021  1752 Potassium(!): 2.8 [AH]    Clinical Course User Index [AH] Margarita Mail, PA-C   MDM Rules/Calculators/A&P                          45 year old female here sent in for vascular catheter dysfunction and multiple missed dialysis sessions.  Believe the patient does not appear to be volume overloaded.  She is able to lie flat and has no shortness of breath.  I ordered and reviewed labs which show CBC with significant drop in patient's hemoglobin.  Hemoccult test is negative.  BMP with potassium of 2.8.  Case discussed with Dr. Royce Macadamia nephrology.  She states that the patient will need blood transfusion and potassium should theoretically rise with blood transfusion.  Initial plan was for patient to be assessed by dialysis nurse here with attempts to flush the catheter and if that would not work to have a tPA drip overnight however the patient may need the catheter changed and this cannot be done at [facility].  The case was discussed with Dr. Waldron Labs who will admit the patient to [facility].  Patient is stable throughout her visit here in the emergency Final Clinical Impression(s) / ED Diagnoses Final diagnoses:  None    Rx / DC Orders ED Discharge Orders    None       Margarita Mail, PA-C 01/17/21 1407    Truddie Hidden, MD 01/17/21 [phone]

## 2021-01-16 NOTE — Progress Notes (Addendum)
° °  Nephrology Nursing Note:   Call received from ED to request troubleshooting of HD catheter.  Unfortunately, I just started HD on another patient and will not be able to come to the ED until 1900 this evening.    I contacted Joy, clinic manager of [facility], where pt normally dialyzes on TTS.  Pt has recently tested positive for Covid and arrangements were made for her to receive HD at a different facility on an isolation shift.  Pt was due to return to regular clinic tomorrow, 1/22, after 10 days of quarantine.    Joy reports she received a call from [facility] today informing her that the pt has not received dialysis for 3 consecutive treatments due to an occluded catheter.    Regular outpatient facility cannot accommodate pt until she has been evaluated, as she has missed 3 consecutive HD sessions.  [facility] staff were unable to draw labs due to peripheral edema.  Bmet and CBC orders noted.  Will assess catheter as soon as possible today.  __________________________________________  Addendum @ 1945:  Case discussed with Dr. Royce Macadamia.  Hgb 6.0 noted.  Pt to receive one unit pRBC.  No emergent need for HD tonight.  I was able to aspirate and flush the venous catheter port without resistance.  The arterial port would not aspirate, but flushed easily.  Could have potentially dialyzed today with lines reversed.  Cathflo was instilled in both lumens for overnight dwell, per Dr. Luis Abed order.   Rockwell Alexandria, RN

## 2021-01-17 DIAGNOSIS — E876 Hypokalemia: Secondary | ICD-10-CM

## 2021-01-17 DIAGNOSIS — Z789 Other specified health status: Secondary | ICD-10-CM | POA: Diagnosis not present

## 2021-01-17 DIAGNOSIS — T829XXD Unspecified complication of cardiac and vascular prosthetic device, implant and graft, subsequent encounter: Secondary | ICD-10-CM | POA: Diagnosis not present

## 2021-01-17 DIAGNOSIS — N186 End stage renal disease: Secondary | ICD-10-CM

## 2021-01-17 DIAGNOSIS — D649 Anemia, unspecified: Secondary | ICD-10-CM | POA: Diagnosis not present

## 2021-01-17 LAB — CBC WITH DIFFERENTIAL/PLATELET
Abs Immature Granulocytes: 0 10*3/uL (ref 0.00–0.07)
Band Neutrophils: 0 %
Basophils Absolute: 0 10*3/uL (ref 0.0–0.1)
Basophils Relative: 0 %
Blasts: 0 %
Eosinophils Absolute: 0 10*3/uL (ref 0.0–0.5)
Eosinophils Relative: 0 %
HCT: 18.9 % — ABNORMAL LOW (ref 36.0–46.0)
Hemoglobin: 6.3 g/dL — CL (ref 12.0–15.0)
Lymphocytes Relative: 17 %
Lymphs Abs: 1.5 10*3/uL (ref 0.7–4.0)
MCH: 27.6 pg (ref 26.0–34.0)
MCHC: 33.3 g/dL (ref 30.0–36.0)
MCV: 82.9 fL (ref 80.0–100.0)
Metamyelocytes Relative: 0 %
Monocytes Absolute: 0.4 10*3/uL (ref 0.1–1.0)
Monocytes Relative: 4 %
Myelocytes: 0 %
Neutro Abs: 7.2 10*3/uL (ref 1.7–7.7)
Neutrophils Relative %: 79 %
Other: 0 %
Platelets: 121 10*3/uL — ABNORMAL LOW (ref 150–400)
Promyelocytes Relative: 0 %
RBC: 2.28 MIL/uL — ABNORMAL LOW (ref 3.87–5.11)
RDW: 19.9 % — ABNORMAL HIGH (ref 11.5–15.5)
WBC: 9.1 10*3/uL (ref 4.0–10.5)
nRBC: 0.7 % — ABNORMAL HIGH (ref 0.0–0.2)
nRBC: 2 /100 WBC — ABNORMAL HIGH

## 2021-01-17 LAB — CBC
HCT: 18.3 % — ABNORMAL LOW (ref 36.0–46.0)
Hemoglobin: 5.9 g/dL — CL (ref 12.0–15.0)
MCH: 27.6 pg (ref 26.0–34.0)
MCHC: 32.2 g/dL (ref 30.0–36.0)
MCV: 85.5 fL (ref 80.0–100.0)
Platelets: 114 10*3/uL — ABNORMAL LOW (ref 150–400)
RBC: 2.14 MIL/uL — ABNORMAL LOW (ref 3.87–5.11)
RDW: 20.1 % — ABNORMAL HIGH (ref 11.5–15.5)
WBC: 8.2 10*3/uL (ref 4.0–10.5)
nRBC: 0.9 % — ABNORMAL HIGH (ref 0.0–0.2)

## 2021-01-17 LAB — BASIC METABOLIC PANEL
Anion gap: 14 (ref 5–15)
BUN: 25 mg/dL — ABNORMAL HIGH (ref 6–20)
CO2: 18 mmol/L — ABNORMAL LOW (ref 22–32)
Calcium: 7.6 mg/dL — ABNORMAL LOW (ref 8.9–10.3)
Chloride: 107 mmol/L (ref 98–111)
Creatinine, Ser: 5.9 mg/dL — ABNORMAL HIGH (ref 0.44–1.00)
GFR, Estimated: 9 mL/min — ABNORMAL LOW (ref >60)
Glucose, Bld: 78 mg/dL (ref 70–99)
Potassium: 3.1 mmol/L — ABNORMAL LOW (ref 3.5–5.1)
Sodium: 139 mmol/L (ref 135–145)

## 2021-01-17 LAB — PREPARE RBC (CROSSMATCH)

## 2021-01-17 MED ORDER — MIDODRINE HCL 5 MG PO TABS
10.0000 mg | ORAL_TABLET | ORAL | Status: DC
  Administered 2021-01-19 – 2021-01-21 (×2): 10 mg via ORAL
  Filled 2021-01-17 (×2): qty 2

## 2021-01-17 MED ORDER — PANCRELIPASE (LIP-PROT-AMYL) 36000-114000 UNITS PO CPEP
36000.0000 [IU] | ORAL_CAPSULE | Freq: Three times a day (TID) | ORAL | Status: DC
  Administered 2021-01-17 – 2021-01-22 (×12): 36000 [IU] via ORAL
  Filled 2021-01-17 (×15): qty 1

## 2021-01-17 MED ORDER — PANTOPRAZOLE SODIUM 40 MG PO TBEC
40.0000 mg | DELAYED_RELEASE_TABLET | Freq: Every day | ORAL | Status: DC
  Administered 2021-01-17 – 2021-01-22 (×6): 40 mg via ORAL
  Filled 2021-01-17 (×6): qty 1

## 2021-01-17 MED ORDER — SODIUM CHLORIDE 0.9 % IV SOLN: 100.0000 mL | INTRAVENOUS | Status: DC | PRN

## 2021-01-17 MED ORDER — CHLORHEXIDINE GLUCONATE CLOTH 2 % EX PADS
6.0000 | MEDICATED_PAD | Freq: Every day | CUTANEOUS | Status: DC
  Administered 2021-01-19 – 2021-01-20 (×2): 6 via TOPICAL

## 2021-01-17 MED ORDER — ALTEPLASE 2 MG IJ SOLR
2.0000 mg | Freq: Once | INTRAMUSCULAR | Status: DC | PRN
  Filled 2021-01-17: qty 2

## 2021-01-17 MED ORDER — SODIUM CHLORIDE 0.9% IV SOLUTION: Freq: Once | INTRAVENOUS | Status: DC

## 2021-01-17 MED ORDER — HEPARIN SODIUM (PORCINE) 1000 UNIT/ML DIALYSIS
1000.0000 [IU] | INTRAMUSCULAR | Status: DC | PRN
  Administered 2021-01-21: 1000 [IU] via INTRAVENOUS_CENTRAL
  Filled 2021-01-17 (×2): qty 1

## 2021-01-17 NOTE — Progress Notes (Addendum)
PROGRESS NOTE    Tamara [name]  KNL:[id number] DOB: [birth year] DOA: 01/22/2021 PCP: Hal Morales, DO   Chief Complaint  Patient presents with  . Vascular Access Problem    Brief Narrative:  As per H&P written by Dr. Waldron Labs on 01/07/2021  Tamara [name]  is a 45 y.o. female, with past medical history of end-stage renal disease on hemodialysis, diminished mental capacity/developmental delay, history of hypertension, history of COVID with ARDS in February 2021, depression, anxiety, patient was sent in for evaluation of clogged hemodialysis catheter, patient had permacath in right subclavian area, and she was diagnosed with COVID, so her schedule has been altered for COVID dialysis days, patient herself cannot provide any history, history was obtained from ED staff, currently patient did not have a full dialysis session over 2 weeks, and apparently her dialysis catheter was clogged today during dialysis, so she could not be dialyzed, so she was not sent to ED for evaluation. - in ED she is in no respiratory distress, no evidence of volume overload, count for potassium of 2.8, BUN of 22, and a hemoglobin of 6, she is Hemoccult negative, [facility] hospitalist consulted to admit.  Assessment & Plan: 1-COVID infection -Patient positive for COVID about 11 days ago -Asymptomatic and without significant changes on her x-ray -Continue supportive care and isolation -No meeting Criteria for remdesivir or steroids.  2-symptomatic anemia -Hemoglobin 6.3 today -Will ask for 1 more unit PRBC transfusion -IV iron and Epogen infusion as per nephrology discretion. -Fecal occult blood test negative and no signs of overt bleeding -Holding heparin products currently  3-end-stage renal dialysis -Nephrology has been consulted -Will follow recommendation plan for further dialysis treatment and electrolyte repletion.  4-hemodialysis access placement -Despite use of tPA unable to flush or use catheter  appropriately -Nephrology has contacted general surgery for femoral nontunneled line important to pursued proper dialysis treatment -IR has been consulted for exchange/check and if needed angioplasty. -no beds or capability for non-emergent hemodialysis and [facility] currently.  5-hypertension -Continue current antihypertensive agents.  6-hypokalemia -Potassium 3.1 controlled -To be adjusted with potassium bath during dialysis. -Follow electrolytes and replete as needed.  7-chronic pancreatitis -Continue the use of Creon.    DVT prophylaxis: SCDs Code Status: Full code. Family Communication: Case discussed with patient's legal guardian Disposition:   Status is: Observation  Dispo: The patient is from: SNF              Anticipated d/c is to: back to her facility              Anticipated d/c date is: To be determined; but hopefully no more than 2 days.              Patient currently no medically stable for discharge; continue to have difficulty with her dialysis access will prevent outpatient treatment.  Also still anemic and requiring blood transfusion.       Consultants:   Nephrologist is  General surgery  IR   Procedures:  See below for x-ray reports   Antimicrobials:  None   Subjective: No fever, no chest pain, no nausea, no vomiting, no requiring oxygen supplementation.  Objective: Vitals:   01/17/21 1100 01/17/21 1330 01/17/21 1400 01/17/21 1511  BP: 140/90 138/88 140/85 (!) 142/83  Pulse: 72 76 70 67  Resp: 18  20 20   Temp: (!) 97.3 F (36.3 C)   (!) 96.5 F (35.8 C)  TempSrc: Oral   Axillary  SpO2: 100% 100% 100% 100%  Weight:      Height:        Intake/Output Summary (Last 24 hours) at 01/17/2021 1554 Last data filed at 01/17/2021 0645 Gross per 24 hour  Intake 1635 ml  Output -  Net 1635 ml   Filed Weights   01/14/2021 1222  Weight: 70 kg    Examination: General exam: Afebrile, in no major distress, denies nausea,  vomiting, chest pain or abdominal pain.  Respiratory system: No using accessory muscles; good oxygen saturation on room air.  No wheezing or crackles. Cardiovascular system: S1 & S2 heard, RRR. No JVD, murmurs, rubs, gallops or clicks. No pedal edema. Gastrointestinal system: Abdomen is nondistended, soft and nontender. No organomegaly or masses felt. Normal bowel sounds heard. Central nervous system: No new focal deficits. Extremities: No cyanosis or clubbing. Skin: No petechiae. Psychiatry: No agitation or combative behavior appreciated.    Data Reviewed: I have personally reviewed following labs and imaging studies  CBC: Recent Labs  Lab 01/25/2021 1732 01/17/21 0500 01/17/21 1327  WBC 8.2 8.2 9.1  NEUTROABS 4.0  --  7.2  HGB 6.0* 5.9* 6.3*  HCT 18.0* 18.3* 18.9*  MCV 84.1 85.5 82.9  PLT 134* 114* 121*    Basic Metabolic Panel: Recent Labs  Lab 01/17/2021 1631 01/17/21 0500  NA 133* 139  K 2.8* 3.1*  CL 103 107  CO2 20* 18*  GLUCOSE 77 78  BUN 22* 25*  CREATININE 5.57* 5.90*  CALCIUM 7.3* 7.6*    GFR: Estimated Creatinine Clearance: 12.1 mL/min (A) (by C-G formula based on SCr of 5.9 mg/dL (H)).   Recent Results (from the past 240 hour(s))  SARS Coronavirus 2 by RT PCR (hospital order, performed in [facility] hospital lab) Nasopharyngeal Nasopharyngeal Swab     Status: Abnormal   Collection Time: 01/13/2021  8:29 PM   Specimen: Nasopharyngeal Swab  Result Value Ref Range Status   SARS Coronavirus 2 POSITIVE (A) NEGATIVE Final    Comment: RESULT CALLED TO, READ BACK BY AND VERIFIED WITH: C. TURNER 1/21 @ 2232 BY S. BEARD. (NOTE) SARS-CoV-2 target nucleic acids are DETECTED  SARS-CoV-2 RNA is generally detectable in upper respiratory specimens  during the acute phase of infection.  Positive results are indicative  of the presence of the identified virus, but do not rule out bacterial infection or co-infection with other pathogens not detected by the test.   Clinical correlation with patient history and  other diagnostic information is necessary to determine patient infection status.  The expected result is negative.  Fact Sheet for Patients:   StrictlyIdeas.no   Fact Sheet for Healthcare Providers:   BankingDealers.co.za    This test is not yet approved or cleared by the [place] FDA and  has been authorized for detection and/or diagnosis of SARS-CoV-2 by FDA under an Emergency Use Authorization (EUA).  This EUA will remain in effect (meaning this te st can be used) for the duration of  the COVID-19 declaration under Section 564(b)(1) of the Act, 21 U.S.C. section 360-bbb-3(b)(1), unless the authorization is terminated or revoked sooner.  Performed at [facility], [address], [place], [place] [address]     Radiology Studies: [facility] Chest [facility] 1 View  Result Date: 01/17/2021 CLINICAL DATA:  Evaluate dialysis catheter EXAM: PORTABLE CHEST 1 VIEW COMPARISON:  11/16/2020 FINDINGS: Lungs are clear.  No pleural effusion or pneumothorax. The heart is normal in size. Right IJ dual lumen dialysis catheter with its distal tip at the cavoatrial junction. IMPRESSION: Right  IJ dual lumen dialysis catheter with its distal tip at the cavoatrial junction. Electronically Signed   By: Julian Hy M.D.   On: 01/24/2021 16:51   Scheduled Meds: . sodium chloride   Intravenous Once  . Chlorhexidine Gluconate Cloth  6 each Topical Q0600  . lipase/protease/amylase  36,000 Units Oral TID  . [START ON 01/19/2021] midodrine  10 mg Oral Once per day on Mon Wed Fri  . pantoprazole  40 mg Oral Daily   Continuous Infusions:   LOS: 1 day    Time spent: 30 minutes  Barton Dubois, MD [facility]   To contact the attending provider between 7A-7P or the covering provider during after hours 7P-7A, please log into the web site [email or web address] and access using universal [place] password for that web  site. If you do not have the password, please call the hospital operator.  01/17/2021, 3:54 PM

## 2021-01-17 NOTE — TOC Progression Note (Signed)
Transition of Care [facility]) - Progression Note    Patient Details  Name: Tamara [name] MRN: [id number] Date of Birth: [birth year]  [facility] CM/SW Contact  Natasha Bence, LCSW Phone Number: 01/17/2021, 3:31 PM  Clinical Narrative:    CSW received notification of Code 44. CSW contacted DSS on call service to speak with DSS rep for Code 44. CSW notified TOC on call supervisor that CSW was not able to reach Marinda Elk the legal guardian from her direct number provided and that the DSS on call service stated they would have someone contact me. CSW also attempted to contact Kandice Hams, supervisor with DSS. CSW notified supervisor that DSS has Hx with a delay in calling TOC back. TOC on call supervisor instructed TOC to follow up with DSS on call service hourly. CSW followed up with DSS on call service hourly and requested that they prioritize response in order to prevent a delay in discharge due to high in patient census. TOC on call supervisor reported that she was able to speak with Elana Alm, [facility] director, who obtained the number for [facility] with DSS. CSW contacted Morey Hummingbird with DSS to notifiy her of patient's discharge and Code 44. CSW later received a call with the on call DSS worker and also notified them of patient's discharge and Code 44. Morey Hummingbird with DSS expressed concern with communication with MD and RN for treatment of patient. CSW notified on call supervisor and provided MD with Carrie's number to discuss treatment. MD reported that patient will no longer be discharging on 01/17/2021 due to stop in HD and transfusion per MD. CSW notified TOC on call supervisor. TOC to follow.         Expected Discharge Plan and Services           Expected Discharge Date: 12/05/20                                     Social Determinants of Health (SDOH) Interventions    Readmission Risk Interventions No flowsheet data found.

## 2021-01-17 NOTE — ED Notes (Signed)
Date and time results received: 01/17/21  1251 (use smartphrase ".now" to insert current time)  Test: hgb Critical Value:5.9 Name of Provider Notified:Dr Dyann Kief Orders Received? Or Actions Taken?: Repeat CBC

## 2021-01-17 NOTE — Op Note (Signed)
Patient:  Tamara [name]  DOB:  1976-11-26  MRN:  [id number]   Preop Diagnosis: End-stage renal disease, clotted permacath  Postop Diagnosis: Same  Procedure: Right femoral dialysis catheter insertion  Surgeon: Aviva Signs, MD  Anes: Local  Indications: Patient is a 45 year old female with end-stage renal disease on dialysis who has had multiple clotting episodes of the right IJ permacath.  Declotting has been unsuccessful.  The risks and benefits of the procedure were explained to the patient's power of attorney, who gave informed consent.  Procedure note: The procedure was done in the emergency room at bedside.  A timeout was performed with the dialysis nurse.  Sterile technique and draping was done.  1% Xylocaine was used for local anesthesia.  The right groin was prepped and draped using the usual sterile technique with ChloraPrep.  A needle was advanced into the right femoral vein using the Seldinger technique without difficulty.  The entrance was dilated and a dual-lumen catheter was inserted over guidewire without difficulty.  The guidewire was removed.  Good backflow of venous blood was noted on aspiration of both ports.  It was secured in the place at the skin level using a 3-0 silk suture.  She was immediately placed on dialysis and good flow was noted.  The patient tolerated the procedure well.  A dry sterile dressing was applied.  Complications: None  EBL: Minimal  Specimen:  none

## 2021-01-17 NOTE — Plan of Care (Signed)
Patient's tunneled catheter not functional after tpa overnight.  She had a nontunneled femoral catheter placed with surgery - greatly appreciate Dr. Arnoldo Morale and RN poteat.  She was able to get HD today.  She is boarding in ER per staff/obs.   Discussed with team this AM and last night - would keep patient at [facility] due to dialysis staffing emergency at [facility] and would consult IR for tunneled catheter exchange then return her to [facility]. Hopefully she will respond to the unit of blood received with HD through a reliable line.  Claudia Desanctis, MD 01/17/2021

## 2021-01-17 NOTE — ED Notes (Signed)
Date and time results received: 01/17/21 1425   Test: Hgb Critical Value: 6.3  Name of Provider Notified: Dr. Dyann Kief  Orders Received? Or Actions Taken?: Orders Received - See Orders for details

## 2021-01-17 NOTE — ED Notes (Signed)
Dialysis RN at bedside.

## 2021-01-17 NOTE — ED Notes (Signed)
Breakfast tray provided. 

## 2021-01-17 NOTE — ED Notes (Signed)
Verbal consent obtained from Tamara [name] Adult Service Supervisor for Dr. Arnoldo Morale to insert central venous line to pt groin for dialysis. Phone call witness by Tana Coast and Blima Ledger RN.

## 2021-01-17 NOTE — ED Notes (Signed)
Please call Jolene Schimke for permission for any procedures the patient might have. She is the Adult Environmental health practitioner. [phone]

## 2021-01-17 NOTE — ED Notes (Signed)
Pt pulling her gown off, her socks & the monitor leads.

## 2021-01-17 NOTE — Plan of Care (Signed)
  Problem: Acute Rehab PT Goals(only PT should resolve) Goal: Pt Will Go Supine/Side To Sit Outcome: Progressing Flowsheets (Taken 01/17/2021 1052) Pt will go Supine/Side to Sit: with moderate assist Goal: Pt Will Go Sit To Supine/Side Outcome: Progressing Flowsheets (Taken 01/17/2021 1052) Pt will go Sit to Supine/Side: with moderate assist Goal: Patient Will Transfer Sit To/From Stand Outcome: Progressing Flowsheets (Taken 01/17/2021 1052) Patient will transfer sit to/from stand: with minimal assist Goal: Pt Will Transfer Bed To Chair/Chair To Bed Outcome: Progressing Flowsheets (Taken 01/17/2021 1052) Pt will Transfer Bed to Chair/Chair to Bed: with min assist Goal: Pt Will Ambulate Outcome: Progressing Flowsheets (Taken 01/17/2021 1052) Pt will Ambulate:  15 feet  with minimal assist  with rolling walker  10:52 AM, 01/17/21 M. Sherlyn Lees, PT, DPT Physical Therapist- [place] Office Number: [phone]

## 2021-01-17 NOTE — Evaluation (Signed)
Physical Therapy Evaluation Patient Details Name: Tamara [name] MRN: [id number] DOB: 09/19/1976 Today's Date: 01/17/2021   History of Present Illness  Tamara [name]  is a 45 y.o. female, with past medical history of end-stage renal disease on hemodialysis, diminished mental capacity/developmental delay, history of hypertension, history of COVID with ARDS in February 2021, depression, anxiety, patient was sent in for evaluation of clogged hemodialysis catheter, patient had permacath in right subclavian area, and she was diagnosed with COVID, so her schedule has been altered for COVID dialysis days, patient herself cannot provide any history, history was obtained from ED staff, currently patient did not have a full dialysis session over 2 weeks, and apparently her dialysis catheter was clogged today during dialysis, so she could not be dialyzed, so she was not sent to ED for evaluation.  - in ED she is in no respiratory distress, no evidence of volume overload, count for potassium of 2.8, BUN of 22, and a hemoglobin of 6, she is Hemoccult negative, [facility] hospitalist consulted to admit.   Clinical Impression   Patient demonstrates significant weakness and poor activity tolerance limiting ability to perform bed mobility, transfers, and inability to ambulate at this time due to deficits and limitations.  Patient requires extensive physical assistance and support to initiate and maintain mobility/postural support due to weakness and coordination deficits.  Patient would benefit from continued PT services whilst hospitalized to improve mobility status and activity tolerance.  Recommend SNF at D/C to continue with services to improve strength, activity tolerance, and progress for ambulation to restore capabilities to PLOF    Follow Up Recommendations SNF    Equipment Recommendations   (TBD)    Recommendations for Other Services OT consult     Precautions / Restrictions Precautions Precautions: Fall  (COVID isolation)      Mobility  Bed Mobility Overal bed mobility: Needs Assistance Bed Mobility: Supine to Sit;Sit to Supine     Supine to sit: Max assist Sit to supine: Max assist        Transfers Overall transfer level: Needs assistance Equipment used: 1 person hand held assist Transfers: Sit to/from Stand Sit to Stand: Max assist         General transfer comment: retropulsion, poor postural control  Ambulation/Gait Ambulation/Gait assistance: Total assist Gait Distance (Feet): 0 Feet Assistive device: 1 person hand held assist       General Gait Details:  (unable to perform due to weakness/poor postural control)  Stairs            Wheelchair Mobility    Modified Rankin (Stroke Patients Only)       Balance Overall balance assessment: Needs assistance Sitting-balance support: Bilateral upper extremity supported;Feet supported     Postural control: Posterior lean Standing balance support: Bilateral upper extremity supported                                 Pertinent Vitals/Pain Pain Assessment: No/denies pain    Home Living Family/patient expects to be discharged to:: Skilled nursing facility                      Prior Function Level of Independence: Needs assistance   Gait / Transfers Assistance Needed: walking with rollator at baseline  ADL's / Homemaking Assistance Needed: mother assists with showering (hair) and dressing (lower body dressing), as well as home making        Hand  Dominance        Extremity/Trunk Assessment   Upper Extremity Assessment Upper Extremity Assessment: Generalized weakness    Lower Extremity Assessment Lower Extremity Assessment: Generalized weakness       Communication   Communication: Other (comment) (oriented x 1)  Cognition Arousal/Alertness: Awake/alert Behavior During Therapy: WFL for tasks assessed/performed Overall Cognitive Status: History of cognitive impairments  - at baseline                                        General Comments      Exercises     Assessment/Plan    PT Assessment Patient needs continued PT services  PT Problem List Decreased strength;Decreased activity tolerance;Decreased balance;Decreased mobility;Decreased coordination;Decreased knowledge of use of DME;Decreased safety awareness;Decreased knowledge of precautions;Cardiopulmonary status limiting activity       PT Treatment Interventions DME instruction;Gait training;Functional mobility training;Therapeutic activities;Therapeutic exercise;Balance training;Neuromuscular re-education;Patient/family education;Wheelchair mobility training;Manual techniques    PT Goals (Current goals can be found in the Care Plan section)  Acute Rehab PT Goals Patient Stated Goal: Improve mobility to PLOF PT Goal Formulation: Patient unable to participate in goal setting Time For Goal Achievement: 01/31/21 Potential to Achieve Goals: Fair    Frequency Min 3X/week   Barriers to discharge Other (comment) (severity of deficits/limitations)      Co-evaluation               AM-PAC PT "6 Clicks" Mobility  Outcome Measure Help needed turning from your back to your side while in a flat bed without using bedrails?: A Lot Help needed moving from lying on your back to sitting on the side of a flat bed without using bedrails?: A Lot Help needed moving to and from a bed to a chair (including a wheelchair)?: A Lot Help needed standing up from a chair using your arms (e.g., wheelchair or bedside chair)?: A Lot Help needed to walk in hospital room?: Total Help needed climbing 3-5 steps with a railing? : Total 6 Click Score: 10    End of Session Equipment Utilized During Treatment: Gait belt Activity Tolerance: Patient limited by fatigue Patient left: in bed;with call bell/phone within reach Nurse Communication: Mobility status;Need for lift equipment PT Visit Diagnosis:  Unsteadiness on feet (R26.81);Muscle weakness (generalized) (M62.81);Difficulty in walking, not elsewhere classified (R26.2)    Time: 3151-7616 PT Time Calculation (min) (ACUTE ONLY): 20 min   Charges:   PT Evaluation $PT Eval Moderate Complexity: 1 Mod PT Treatments $Therapeutic Activity: 8-22 mins       10:50 AM, 01/17/21 M. Sherlyn Lees, PT, DPT Physical Therapist- [place] Office Number: [phone]

## 2021-01-17 NOTE — Procedures (Signed)
   HEMODIALYSIS TREATMENT NOTE:  Pt has diminished mental capacity and is unable to tell me about her typical dialysis sessions or current access function.  She is lethargic and uncooperative.  Consent for HD was obtained via telephone from [place] Jolene Schimke [phone].  Hgb only ^ from 5.9 to 6.3 after 2 units pRBC.  A third unit was ordered for transfusion with HD today.  RIJ TDC demonstrated potential to function:  CXR verified placement.   I was able to aspirate blood from and easily flush the venous limb.  I was unable to aspirate blood from the arterial limb, but it flushed with ease.    HD was initiated with lines reversed, the only possible configuration.  Arterial pressure fluctuated wildly and would not allow for a flow > 110 cc/m.  pRBC transfusion was completed with moderate difficulty, blood was returned, treatment was stopped, and Dr. Royce Macadamia was notified of inability to use [facility] for dialysis.  After consultation, Dr. Arnoldo Morale was kind enough to place a temporary right femoral vas-cath and HD was completed.   Net UF 1.4 liters.  Chart reviewed.  Pt is, apparently, well-known to IR service and has undergone multiple TDC exchanges and fibrin sheath disruptions.  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  Pt has no PIV access.  Right arm (old PIV site) is bruised throughout, soft to touch, and without hematoma.  Dr. Arnoldo Morale has authorized use of [facility] by primary service for med administration and blood transfusion.  NS gtt was connected @[facility] .    Dr. Royce Macadamia was apprised of pt's condition and has reviewed the chart remotely.  Pt will be seen in formal consultation Monday.  Rockwell Alexandria, RN

## 2021-01-18 DIAGNOSIS — R6250 Unspecified lack of expected normal physiological development in childhood: Secondary | ICD-10-CM | POA: Diagnosis not present

## 2021-01-18 DIAGNOSIS — Z8249 Family history of ischemic heart disease and other diseases of the circulatory system: Secondary | ICD-10-CM | POA: Diagnosis not present

## 2021-01-18 DIAGNOSIS — E038 Other specified hypothyroidism: Secondary | ICD-10-CM | POA: Diagnosis not present

## 2021-01-18 DIAGNOSIS — E7800 Pure hypercholesterolemia, unspecified: Secondary | ICD-10-CM | POA: Diagnosis not present

## 2021-01-18 DIAGNOSIS — Z452 Encounter for adjustment and management of vascular access device: Secondary | ICD-10-CM | POA: Diagnosis not present

## 2021-01-18 DIAGNOSIS — R112 Nausea with vomiting, unspecified: Secondary | ICD-10-CM | POA: Diagnosis not present

## 2021-01-18 DIAGNOSIS — Z992 Dependence on renal dialysis: Secondary | ICD-10-CM | POA: Diagnosis not present

## 2021-01-18 DIAGNOSIS — E039 Hypothyroidism, unspecified: Secondary | ICD-10-CM | POA: Diagnosis not present

## 2021-01-18 DIAGNOSIS — I959 Hypotension, unspecified: Secondary | ICD-10-CM | POA: Diagnosis not present

## 2021-01-18 DIAGNOSIS — K861 Other chronic pancreatitis: Secondary | ICD-10-CM | POA: Diagnosis not present

## 2021-01-18 DIAGNOSIS — A419 Sepsis, unspecified organism: Secondary | ICD-10-CM | POA: Diagnosis not present

## 2021-01-18 DIAGNOSIS — E876 Hypokalemia: Secondary | ICD-10-CM | POA: Diagnosis not present

## 2021-01-18 DIAGNOSIS — K219 Gastro-esophageal reflux disease without esophagitis: Secondary | ICD-10-CM | POA: Diagnosis not present

## 2021-01-18 DIAGNOSIS — D631 Anemia in chronic kidney disease: Secondary | ICD-10-CM | POA: Diagnosis not present

## 2021-01-18 DIAGNOSIS — N186 End stage renal disease: Secondary | ICD-10-CM | POA: Diagnosis not present

## 2021-01-18 DIAGNOSIS — Z8674 Personal history of sudden cardiac arrest: Secondary | ICD-10-CM | POA: Diagnosis not present

## 2021-01-18 DIAGNOSIS — J1282 Pneumonia due to coronavirus disease 2019: Secondary | ICD-10-CM | POA: Diagnosis not present

## 2021-01-18 DIAGNOSIS — Z789 Other specified health status: Secondary | ICD-10-CM | POA: Diagnosis not present

## 2021-01-18 DIAGNOSIS — F419 Anxiety disorder, unspecified: Secondary | ICD-10-CM | POA: Diagnosis present

## 2021-01-18 DIAGNOSIS — R579 Shock, unspecified: Secondary | ICD-10-CM | POA: Diagnosis not present

## 2021-01-18 DIAGNOSIS — D649 Anemia, unspecified: Secondary | ICD-10-CM | POA: Diagnosis not present

## 2021-01-18 DIAGNOSIS — Z66 Do not resuscitate: Secondary | ICD-10-CM | POA: Diagnosis not present

## 2021-01-18 DIAGNOSIS — Z515 Encounter for palliative care: Secondary | ICD-10-CM | POA: Diagnosis not present

## 2021-01-18 DIAGNOSIS — T8249XA Other complication of vascular dialysis catheter, initial encounter: Secondary | ICD-10-CM | POA: Diagnosis not present

## 2021-01-18 DIAGNOSIS — I120 Hypertensive chronic kidney disease with stage 5 chronic kidney disease or end stage renal disease: Secondary | ICD-10-CM | POA: Diagnosis not present

## 2021-01-18 DIAGNOSIS — G9341 Metabolic encephalopathy: Secondary | ICD-10-CM | POA: Diagnosis not present

## 2021-01-18 DIAGNOSIS — U071 COVID-19: Secondary | ICD-10-CM | POA: Diagnosis not present

## 2021-01-18 DIAGNOSIS — M109 Gout, unspecified: Secondary | ICD-10-CM | POA: Diagnosis not present

## 2021-01-18 DIAGNOSIS — Y712 Prosthetic and other implants, materials and accessory cardiovascular devices associated with adverse incidents: Secondary | ICD-10-CM | POA: Diagnosis present

## 2021-01-18 DIAGNOSIS — N185 Chronic kidney disease, stage 5: Secondary | ICD-10-CM | POA: Diagnosis not present

## 2021-01-18 DIAGNOSIS — R680 Hypothermia, not associated with low environmental temperature: Secondary | ICD-10-CM | POA: Diagnosis not present

## 2021-01-18 DIAGNOSIS — F32A Depression, unspecified: Secondary | ICD-10-CM | POA: Diagnosis not present

## 2021-01-18 DIAGNOSIS — D65 Disseminated intravascular coagulation [defibrination syndrome]: Secondary | ICD-10-CM | POA: Diagnosis not present

## 2021-01-18 DIAGNOSIS — I82409 Acute embolism and thrombosis of unspecified deep veins of unspecified lower extremity: Secondary | ICD-10-CM | POA: Diagnosis not present

## 2021-01-18 DIAGNOSIS — Z8711 Personal history of peptic ulcer disease: Secondary | ICD-10-CM | POA: Diagnosis not present

## 2021-01-18 DIAGNOSIS — T829XXD Unspecified complication of cardiac and vascular prosthetic device, implant and graft, subsequent encounter: Secondary | ICD-10-CM | POA: Diagnosis not present

## 2021-01-18 DIAGNOSIS — N179 Acute kidney failure, unspecified: Secondary | ICD-10-CM | POA: Diagnosis not present

## 2021-01-18 DIAGNOSIS — E872 Acidosis: Secondary | ICD-10-CM | POA: Diagnosis not present

## 2021-01-18 LAB — CBC
HCT: 28.2 % — ABNORMAL LOW (ref 36.0–46.0)
Hemoglobin: 9.8 g/dL — ABNORMAL LOW (ref 12.0–15.0)
MCH: 28.2 pg (ref 26.0–34.0)
MCHC: 34.8 g/dL (ref 30.0–36.0)
MCV: 81.3 fL (ref 80.0–100.0)
Platelets: 120 10*3/uL — ABNORMAL LOW (ref 150–400)
RBC: 3.47 MIL/uL — ABNORMAL LOW (ref 3.87–5.11)
RDW: 17.7 % — ABNORMAL HIGH (ref 11.5–15.5)
WBC: 12.1 10*3/uL — ABNORMAL HIGH (ref 4.0–10.5)
nRBC: 0.4 % — ABNORMAL HIGH (ref 0.0–0.2)

## 2021-01-18 LAB — RENAL FUNCTION PANEL
Albumin: 2.3 g/dL — ABNORMAL LOW (ref 3.5–5.0)
Anion gap: 16 — ABNORMAL HIGH (ref 5–15)
BUN: 12 mg/dL (ref 6–20)
CO2: 22 mmol/L (ref 22–32)
Calcium: 7.5 mg/dL — ABNORMAL LOW (ref 8.9–10.3)
Chloride: 98 mmol/L (ref 98–111)
Creatinine, Ser: 3.43 mg/dL — ABNORMAL HIGH (ref 0.44–1.00)
GFR, Estimated: 16 mL/min — ABNORMAL LOW (ref >60)
Glucose, Bld: 61 mg/dL — ABNORMAL LOW (ref 70–99)
Phosphorus: 1.9 mg/dL — ABNORMAL LOW (ref 2.5–4.6)
Potassium: 3 mmol/L — ABNORMAL LOW (ref 3.5–5.1)
Sodium: 136 mmol/L (ref 135–145)

## 2021-01-18 LAB — GLUCOSE, CAPILLARY: Glucose-Capillary: 58 mg/dL — ABNORMAL LOW (ref 70–99)

## 2021-01-18 MED ORDER — POTASSIUM & SODIUM PHOSPHATES 280-160-250 MG PO PACK
1.0000 | PACK | Freq: Once | ORAL | Status: AC
  Administered 2021-01-18: 1 via ORAL
  Filled 2021-01-18 (×2): qty 1

## 2021-01-18 MED ORDER — PROCHLORPERAZINE EDISYLATE 10 MG/2ML IJ SOLN
10.0000 mg | Freq: Four times a day (QID) | INTRAMUSCULAR | Status: DC | PRN
  Administered 2021-01-18 – 2021-01-21 (×4): 10 mg via INTRAVENOUS
  Filled 2021-01-18 (×4): qty 2

## 2021-01-18 NOTE — TOC Progression Note (Signed)
Transition of Care [facility]) - Progression Note    Patient Details  Name: Tamara [name] MRN: [id number] Date of Birth: April 04, 1976  Transition of Care [facility]) CM/SW Contact  Natasha Bence, LCSW Phone Number: 01/18/2021, 11:07 AM  Clinical Narrative:    CSW observed Radiology's note for potential transfer to [facility] for vascular surgery for patient. CSW notified Morey Hummingbird with DSS at [phone] of potential transfer and obs status. CSW also notified MD and RN of the need for consent from DSS for transfer and surgery. Carrie's number provided to MD and RN. TOC to follow.        Expected Discharge Plan and Services           Expected Discharge Date: 12/05/20                                     Social Determinants of Health (SDOH) Interventions    Readmission Risk Interventions No flowsheet data found.

## 2021-01-18 NOTE — Progress Notes (Signed)
PROGRESS NOTE    Tamara [name]  ZYY:[id number] DOB: Oct 30, 1976 DOA: 01/15/2021 PCP: Hal Morales, DO   Chief Complaint  Patient presents with  . Vascular Access Problem    Brief Narrative:  As per H&P written by Dr. Waldron Labs on 01/19/2021  Tamara [name]  is a 45 y.o. female, with past medical history of end-stage renal disease on hemodialysis, diminished mental capacity/developmental delay, history of hypertension, history of COVID with ARDS in February 2021, depression, anxiety, patient was sent in for evaluation of clogged hemodialysis catheter, patient had permacath in right subclavian area, and she was diagnosed with COVID, so her schedule has been altered for COVID dialysis days, patient herself cannot provide any history, history was obtained from ED staff, currently patient did not have a full dialysis session over 2 weeks, and apparently her dialysis catheter was clogged today during dialysis, so she could not be dialyzed, so she was not sent to ED for evaluation. - in ED she is in no respiratory distress, no evidence of volume overload, count for potassium of 2.8, BUN of 22, and a hemoglobin of 6, she is Hemoccult negative, [facility] hospitalist consulted to admit.  Assessment & Plan: 1-COVID infection -Patient positive for COVID about 11 days ago -Asymptomatic and without significant changes on her x-ray -Continue supportive care and isolation -No meeting Criteria for remdesivir or steroids.  2-symptomatic anemia -At around 3 units PRBCs transfused -Current hemoglobin 9.8 -IV iron infusion and Epogen per nephrology service discretion. -Fecal occult blood test negative and no signs of overt bleeding appreciated. -Continue holding heparin products currently  3-end-stage renal dialysis -Nephrology on board and actively following patient. -Will follow recommendation plan for further dialysis treatment and electrolyte repletion.  4-hemodialysis access placement -Despite  use of tPA unable to flush or use catheter appropriately -Nephrology contacted general surgery who graciously has placed nontunneled pulmonary catheter to facilitate dialysis; last dialysis treatment 4/88/8916 without complications. -IR has been consulted for exchange/check and if needed angioplasty.  Patient will be transferred to [facility] for further evaluation and decision dialysis access; once they are vascular surgery will be contacted.  5-hypertension -Continue current antihypertensive agents.  6-hypokalemia -Potassium 3.1 controlled -To be adjusted with potassium bath during dialysis. -Follow electrolytes and replete as needed.  7-chronic pancreatitis -Continue the use of Creon.  8-nausea/vomiting -Continue as needed antiemetics.    DVT prophylaxis: SCDs Code Status: Full code. Family Communication: Case discussed with patient's legal guardian Disposition:   Status is: Observation  Dispo: The patient is from: SNF              Anticipated d/c is to: back to her facility              Anticipated d/c date is: To be determined; but hopefully no more than 2 days hopefully.              Patient currently no medically stable for discharge; continue to have difficulty with her dialysis access; which is preventing outpatient treatment.  Following recommendations by IR patient will be transferred to [facility]   Consultants:   Nephrologist is  General surgery  IR   Procedures:  See below for x-ray reports   Antimicrobials:  None   Subjective: No fever, no chest pain, no abdominal pain, no diarrhea.  No requiring oxygen supplementation.  1 episode of isolated nausea/vomiting.  Objective: Vitals:   01/18/21 1100 01/18/21 1130 01/18/21 1200 01/18/21 1230  BP: (!) 129/114 106/74 105/72 110/75  Pulse:    87  Resp:    18  Temp:      TempSrc:      SpO2:    100%  Weight:      Height:        Intake/Output Summary (Last 24 hours) at 01/18/2021  1405 Last data filed at 01/17/2021 1900 Gross per 24 hour  Intake 315 ml  Output 1419 ml  Net -1104 ml   Filed Weights   01/02/2021 1222  Weight: 70 kg    Examination: General exam: Alert, awake, oriented x 3; no fever, no chest pain, no diarrhea or abdominal pain.  Patient with 1 isolated episode of nausea/vomiting. Patient oriented to person; in no acute distress. Respiratory system: Clear to auscultation. Respiratory effort normal.  No wheezing, no crackles. Cardiovascular system: Rate controlled, no rubs, no gallops, no JVD. Gastrointestinal system: Abdomen is nondistended, soft and nontender. No organomegaly or masses felt. Normal bowel sounds heard. Central nervous system: No new focal neurological deficits. Extremities: No cyanosis or clubbing; right upper extremity with large bruise; no intervention or tenderness on palpation. Skin: No petechiae. Psychiatry: Mood & affect appropriate.     Data Reviewed: I have personally reviewed following labs and imaging studies  CBC: Recent Labs  Lab 01/06/2021 1732 01/17/21 0500 01/17/21 1327 01/18/21 0738  WBC 8.2 8.2 9.1 12.1*  NEUTROABS 4.0  --  7.2  --   HGB 6.0* 5.9* 6.3* 9.8*  HCT 18.0* 18.3* 18.9* 28.2*  MCV 84.1 85.5 82.9 81.3  PLT 134* 114* 121* 120*    Basic Metabolic Panel: Recent Labs  Lab 01/13/2021 1631 01/17/21 0500 01/18/21 0738  NA 133* 139 136  K 2.8* 3.1* 3.0*  CL 103 107 98  CO2 20* 18* 22  GLUCOSE 77 78 61*  BUN 22* 25* 12  CREATININE 5.57* 5.90* 3.43*  CALCIUM 7.3* 7.6* 7.5*  PHOS  --   --  1.9*    GFR: Estimated Creatinine Clearance: 20.8 mL/min (A) (by C-G formula based on SCr of 3.43 mg/dL (H)).   Recent Results (from the past 240 hour(s))  SARS Coronavirus 2 by RT PCR (hospital order, performed in [facility] hospital lab) Nasopharyngeal Nasopharyngeal Swab     Status: Abnormal   Collection Time: 01/26/2021  8:29 PM   Specimen: Nasopharyngeal Swab  Result Value Ref Range Status   SARS  Coronavirus 2 POSITIVE (A) NEGATIVE Final    Comment: RESULT CALLED TO, READ BACK BY AND VERIFIED WITH: C. TURNER 1/21 @ 2232 BY S. BEARD. (NOTE) SARS-CoV-2 target nucleic acids are DETECTED  SARS-CoV-2 RNA is generally detectable in upper respiratory specimens  during the acute phase of infection.  Positive results are indicative  of the presence of the identified virus, but do not rule out bacterial infection or co-infection with other pathogens not detected by the test.  Clinical correlation with patient history and  other diagnostic information is necessary to determine patient infection status.  The expected result is negative.  Fact Sheet for Patients:   StrictlyIdeas.no   Fact Sheet for Healthcare Providers:   BankingDealers.co.za    This test is not yet approved or cleared by the [place] FDA and  has been authorized for detection and/or diagnosis of SARS-CoV-2 by FDA under an Emergency Use Authorization (EUA).  This EUA will remain in effect (meaning this te st can be used) for the duration of  the COVID-19 declaration under Section 564(b)(1) of the Act, 21 U.S.C. section 360-bbb-3(b)(1), unless the authorization  is terminated or revoked sooner.  Performed at [facility], [address], [place], [place] [address]     Radiology Studies: [facility] Chest [facility] 1 View  Result Date: 12/27/2020 CLINICAL DATA:  Evaluate dialysis catheter EXAM: PORTABLE CHEST 1 VIEW COMPARISON:  11/16/2020 FINDINGS: Lungs are clear.  No pleural effusion or pneumothorax. The heart is normal in size. Right IJ dual lumen dialysis catheter with its distal tip at the cavoatrial junction. IMPRESSION: Right IJ dual lumen dialysis catheter with its distal tip at the cavoatrial junction. Electronically Signed   By: Julian Hy M.D.   On: 01/12/2021 16:51   Scheduled Meds: . sodium chloride   Intravenous Once  . Chlorhexidine Gluconate Cloth  6 each  Topical Q0600  . lipase/protease/amylase  36,000 Units Oral TID  . [START ON 01/19/2021] midodrine  10 mg Oral Once per day on Mon Wed Fri  . pantoprazole  40 mg Oral Daily   Continuous Infusions: . sodium chloride    . sodium chloride       LOS: 1 day    Time spent: 30 minutes  Barton Dubois, MD [facility]   To contact the attending provider between 7A-7P or the covering provider during after hours 7P-7A, please log into the web site [email or web address] and access using universal [place] password for that web site. If you do not have the password, please call the hospital operator.  01/18/2021, 2:05 PM

## 2021-01-18 NOTE — ED Notes (Signed)
Leafy Half called and informed plan of care to transfer to [facility] for vascular consult for placement for long term dialysis catheter placement. Ms. Tamara [name] agrees to consent for transfer, verified by Anastasio Champion RN.

## 2021-01-18 NOTE — Progress Notes (Signed)
   Request made for tunneled HD catheter evaluation and possible intervention  This pt is known to IR  12/05/20: Rt IJ tunneled cath exchange with angioplasty and fibrin sheath removal.  12/12/20: NEW tunneled HD cath placed   12/29/20:  Extensive fibrin sheath extending from the right innominate vein into the right atrium where conglomerate fibrin versus chronic thrombus occupies approximately 30-50% of the right atrial volume as illustrated by venogram and intravascular ultrasound. 2. Fibrin sheath disruption by balloon angioplasty of the SVC (up to 7 mm) and the right atrium (up to 14 mm). 3. Successful exchange of tunneled hemodialysis catheter will with a 19 cm catheter with its tip in the right atrium. Catheter is ready for immediate use.  Discussed case with Dr Kathlene Cote Pt has quickly failed SVC intervention again. Recommend:  Vascular surgery consult and consideration of new access.  May need Transfer to [facility] for admission and evaluation.  She has temp catheter in now- Rt femoral temp cath per Dr Arnoldo Morale 01/17/21.  Will await plan per Dr Royce Macadamia Dr Kathlene Cote can be reached [phone]

## 2021-01-18 NOTE — ED Notes (Signed)
[facility] arrived to provide transport for Pt.

## 2021-01-18 NOTE — ED Notes (Signed)
Pt vomited unmeasured amount of small clear fluid. Pt denies any other nausea. Pt cleaned and gown changed.

## 2021-01-18 NOTE — ED Notes (Signed)
ED TO INPATIENT HANDOFF REPORT  ED Nurse Name and Phone #: [phone]  S Name/Age/Gender Tamara [name] 45 y.o. female Room/Bed: APA18/APA18  Code Status   Code Status: Full Code  Home/SNF/Other Nursing Home Patient oriented to: self Is this baseline? Yes   Triage Complete: Triage complete  Chief Complaint Symptomatic anemia [D64.9]  Triage Note Pt brought to ED via [facility] for clogged dialysis port per facility. Pt from [facility]    Allergies Allergies  Allergen Reactions  . Penicillins   . Sulfa Antibiotics     Level of Care/Admitting Diagnosis ED Disposition    ED Disposition Condition Comment   Admit  Hospital Area: [facility] [[id number]]  Level of Care: Med-Surg [16]  Covid Evaluation: Confirmed COVID Positive  Diagnosis: Symptomatic anemia [2694854]  Admitting Physician: Barton Dubois [[id number]]  Attending Physician: Barton Dubois [[id number]]       B Medical/Surgery History Past Medical History:  Diagnosis Date  . Abnormal uterine bleeding (AUB) 01/26/2016  . Acute pancreatitis without necrosis or infection, unspecified   . Anemia in chronic kidney disease (CKD)   . Anxiety and depression    mentally slow  . Cardiac arrhythmia   . Chronic abdominal pain   . COVID-19   . Dependence on renal dialysis ([place])   . Depression   . Gastro-esophageal reflux disease without esophagitis   . Generalized anxiety disorder   . Gout   . Hematuria 01/26/2016  . Hyperlipidemia   . Hypertension   . Hypomagnesemia   . Leg pain   . Major depressive disorder   . Obesity   . Ovarian cyst 02/10/2016  . Personal history of sudden cardiac arrest   . Pseudocyst of pancreas   . PUD (peptic ulcer disease)   . Pure hypercholesterolemia   . Reflux   . Renal disorder   . S/P colonoscopy 03/15/11   friable anal canal  . Sebaceous cyst of labia 01/26/2016  . Tachycardia   . Unspecified psychosis not due to a substance or known physiological condition ([place])   . Yeast  vaginitis 09/19/2013   Past Surgical History:  Procedure Laterality Date  . IR FLUORO GUIDE CV LINE RIGHT  12/05/2020  . IR FLUORO GUIDE CV LINE RIGHT  12/12/2020  . IR FLUORO GUIDE CV LINE RIGHT  12/29/2020  . IR INTRAVASCULAR ULTRASOUND NON CORONARY  12/29/2020  . IR PTA VENOUS ADDL EXCEPT DIALYSIS CIRCUIT  12/29/2020  . IR PTA VENOUS EXCEPT DIALYSIS CIRCUIT  12/05/2020  . IR US GUIDE VASC ACCESS RIGHT  12/12/2020  . TOOTH EXTRACTION N/A 06/03/2014   Procedure: EXTRACTION WISDOM TEETH;  Surgeon: Gae Bon, DDS;  Location: [facility];  Service: Oral Surgery;  Laterality: N/A;  . TUBAL LIGATION       A IV Location/Drains/Wounds Patient Lines/Drains/Airways Status    Active Line/Drains/Airways    Name Placement date Placement time Site Days   Hemodialysis Catheter Right Internal jugular Double lumen Permanent (Tunneled) 12/05/20  1512  Internal jugular  44   Hemodialysis Catheter Right Internal jugular Double lumen Permanent (Tunneled) 12/29/20  1356  Internal jugular  20   Hemodialysis Catheter Right Femoral vein Double lumen Temporary (Non-Tunneled) 01/17/21  --  Femoral vein  1   Airway --  --  -- --   Incision (Closed) 06/03/14 Lip 06/03/14  0920  -- 2421          Intake/Output Last 24 hours  Intake/Output Summary (Last 24 hours) at 01/18/2021 1013 Last data  filed at 01/17/2021 1900 Gross per 24 hour  Intake 315 ml  Output 1419 ml  Net -1104 ml    Labs/Imaging Results for orders placed or performed during the hospital encounter of 01/17/2021 (from the past 48 hour(s))  Basic metabolic panel     Status: Abnormal   Collection Time: 01/25/2021  4:31 PM  Result Value Ref Range   Sodium 133 (L) 135 - 145 mmol/L   Potassium 2.8 (L) 3.5 - 5.1 mmol/L   Chloride 103 98 - 111 mmol/L   CO2 20 (L) 22 - 32 mmol/L   Glucose, Bld 77 70 - 99 mg/dL    Comment: Glucose reference range applies only to samples taken after fasting for at least 8 hours.   BUN 22 (H) 6 - 20 mg/dL   Creatinine, Ser  5.57 (H) 0.44 - 1.00 mg/dL   Calcium 7.3 (L) 8.9 - 10.3 mg/dL   GFR, Estimated 9 (L) >60 mL/min    Comment: (NOTE) Calculated using the CKD-EPI Creatinine Equation (2021)    Anion gap 10 5 - 15    Comment: Performed at [facility], [address] [place], [place] [address]  CBC with Differential/Platelet     Status: Abnormal   Collection Time: 01/13/2021  5:32 PM  Result Value Ref Range   WBC 8.2 4.0 - 10.5 K/uL   RBC 2.14 (L) 3.87 - 5.11 MIL/uL   Hemoglobin 6.0 (LL) 12.0 - 15.0 g/dL    Comment: This critical result has verified and been called to EASTER,T by Duncan Dull on 01 21 2022 at 1817, and has been read back.    HCT 18.0 (L) 36.0 - 46.0 %   MCV 84.1 80.0 - 100.0 fL   MCH 28.0 26.0 - 34.0 pg   MCHC 33.3 30.0 - 36.0 g/dL   RDW 19.7 (H) 11.5 - 15.5 %   Platelets 134 (L) 150 - 400 K/uL    Comment: Immature Platelet Fraction may be clinically indicated, consider ordering this additional test [id number]    nRBC 0.5 (H) 0.0 - 0.2 %   Neutrophils Relative % 49 %   Neutro Abs 4.0 1.7 - 7.7 K/uL   Lymphocytes Relative 47 %   Lymphs Abs 3.8 0.7 - 4.0 K/uL   Monocytes Relative 4 %   Monocytes Absolute 0.3 0.1 - 1.0 K/uL   Eosinophils Relative 0 %   Eosinophils Absolute 0.0 0.0 - 0.5 K/uL   Basophils Relative 0 %   Basophils Absolute 0.0 0.0 - 0.1 K/uL   Immature Granulocytes 0 %   Abs Immature Granulocytes 0.03 0.00 - 0.07 K/uL    Comment: Performed at [facility], [address], [place], [place] [address]  Type and screen     Status: None (Preliminary result)   Collection Time: 01/09/2021  6:56 PM  Result Value Ref Range   ABO/RH(D) O POS    Antibody Screen NEG    Sample Expiration 01/19/2021,2359    Unit Number [id number]    Blood Component Type RED CELLS,LR    Unit division 00    Status of Unit ISSUED,FINAL    Transfusion Status OK TO TRANSFUSE    Crossmatch Result Compatible    Unit Number [id number]    Blood Component Type RED CELLS,LR    Unit division 00     Status of Unit ISSUED,FINAL    Transfusion Status OK TO TRANSFUSE    Crossmatch Result      Compatible Performed at [facility], [place]  [address] [place], [place] [address]    Unit Number [id number]    Blood Component Type RED CELLS,LR    Unit division 00    Status of Unit ISSUED,FINAL    Transfusion Status OK TO TRANSFUSE    Crossmatch Result Compatible    Unit Number [id number]    Blood Component Type RED CELLS,LR    Unit division 00    Status of Unit ALLOCATED    Transfusion Status OK TO TRANSFUSE    Crossmatch Result Compatible   Prepare RBC (crossmatch)     Status: None   Collection Time: 01/15/2021  6:56 PM  Result Value Ref Range   Order Confirmation      ORDER PROCESSED BY BLOOD BANK Performed at [facility], [address], [place], [place] [address]   SARS Coronavirus 2 by RT PCR (hospital order, performed in [place] hospital lab) Nasopharyngeal Nasopharyngeal Swab     Status: Abnormal   Collection Time: 01/12/2021  8:29 PM   Specimen: Nasopharyngeal Swab  Result Value Ref Range   SARS Coronavirus 2 POSITIVE (A) NEGATIVE    Comment: RESULT CALLED TO, READ BACK BY AND VERIFIED WITH: C. TURNER 1/21 @ 2232 BY S. BEARD. (NOTE) SARS-CoV-2 target nucleic acids are DETECTED  SARS-CoV-2 RNA is generally detectable in upper respiratory specimens  during the acute phase of infection.  Positive results are indicative  of the presence of the identified virus, but do not rule out bacterial infection or co-infection with other pathogens not detected by the test.  Clinical correlation with patient history and  other diagnostic information is necessary to determine patient infection status.  The expected result is negative.  Fact Sheet for Patients:   StrictlyIdeas.no   Fact Sheet for Healthcare Providers:   BankingDealers.co.za    This test is not yet approved or cleared by the [place] FDA and  has been authorized  for detection and/or diagnosis of SARS-CoV-2 by FDA under an Emergency Use Authorization (EUA).  This EUA will remain in effect (meaning this te st can be used) for the duration of  the COVID-19 declaration under Section 564(b)(1) of the Act, 21 U.S.C. section 360-bbb-3(b)(1), unless the authorization is terminated or revoked sooner.  Performed at [facility], [address], [place], [place] [address]   POC SARS Coronavirus 2 Ag-ED - Nasal Swab (BD Veritor Kit)     Status: Abnormal   Collection Time: 01/07/2021  9:31 PM  Result Value Ref Range   SARS Coronavirus 2 Ag POSITIVE (A) NEGATIVE    Comment: (NOTE) SARS-CoV-2 antigen PRESENT.  Positive results indicate the presence of viral antigens, but clinical correlation with patient history and other diagnostic information is necessary to determine patient infection status.  Positive results do not rule out bacterial infection or co-infection  with other viruses. False positive results are rare but can occur, and confirmatory RT-PCR testing may be appropriate in some circumstances. The expected result is Negative.  Fact Sheet for Patients: HandmadeRecipes.com.cy  Fact Sheet for Healthcare Providers: FuneralLife.at  This test is not yet approved or cleared by the [place] FDA and  has been authorized for detection and/or diagnosis of SARS-CoV-2 by FDA under an Emergency Use Authorization (EUA).  This EUA will remain in effect (meaning this test can be used) for the duration of  the COVID-19 declaration under Section 564(b)(1) of the Act, 21 U.S.C. section 306 817 5770( b)(1), unless the authorization is terminated or revoked sooner.    POC SARS Coronavirus 2 Ag-ED -  Status: Abnormal   Collection Time: 01/18/2021  9:31 PM  Result Value Ref Range   SARS Coronavirus 2 Ag POSITIVE (A) NEGATIVE    Comment: (NOTE) SARS-CoV-2 antigen PRESENT.  Positive results indicate the presence of  viral antigens, but clinical correlation with patient history and other diagnostic information is necessary to determine patient infection status.  Positive results do not rule out bacterial infection or co-infection  with other viruses. False positive results are rare but can occur, and confirmatory RT-PCR testing may be appropriate in some circumstances. The expected result is Negative.  Fact Sheet for Patients: HandmadeRecipes.com.cy  Fact Sheet for Healthcare Providers: FuneralLife.at  This test is not yet approved or cleared by the [place] FDA and  has been authorized for detection and/or diagnosis of SARS-CoV-2 by FDA under an Emergency Use Authorization (EUA).  This EUA will remain in effect (meaning this test can be used) for the duration of  the COVID-19 declaration under Section 564(b)(1) of the Act, 21 U.S.C. section [phone]( b)(1), unless the authorization is terminated or revoked sooner.    Basic metabolic panel     Status: Abnormal   Collection Time: 01/17/21  5:00 AM  Result Value Ref Range   Sodium 139 135 - 145 mmol/L   Potassium 3.1 (L) 3.5 - 5.1 mmol/L   Chloride 107 98 - 111 mmol/L   CO2 18 (L) 22 - 32 mmol/L   Glucose, Bld 78 70 - 99 mg/dL    Comment: Glucose reference range applies only to samples taken after fasting for at least 8 hours.   BUN 25 (H) 6 - 20 mg/dL   Creatinine, Ser 5.90 (H) 0.44 - 1.00 mg/dL   Calcium 7.6 (L) 8.9 - 10.3 mg/dL   GFR, Estimated 9 (L) >60 mL/min    Comment: (NOTE) Calculated using the CKD-EPI Creatinine Equation (2021)    Anion gap 14 5 - 15    Comment: Performed at [facility], [address] [place], [place] [address]  CBC     Status: Abnormal   Collection Time: 01/17/21  5:00 AM  Result Value Ref Range   WBC 8.2 4.0 - 10.5 K/uL   RBC 2.14 (L) 3.87 - 5.11 MIL/uL   Hemoglobin 5.9 (LL) 12.0 - 15.0 g/dL    Comment: This critical result has verified and been called to  Malisha Mabey,RN by Deanne Coffer on 01 22 2022 at 1245, and has been read back.    HCT 18.3 (L) 36.0 - 46.0 %   MCV 85.5 80.0 - 100.0 fL   MCH 27.6 26.0 - 34.0 pg   MCHC 32.2 30.0 - 36.0 g/dL   RDW 20.1 (H) 11.5 - 15.5 %   Platelets 114 (L) 150 - 400 K/uL    Comment: REPEATED TO VERIFY PLATELET COUNT CONFIRMED BY SMEAR SPECIMEN CHECKED FOR CLOTS    nRBC 0.9 (H) 0.0 - 0.2 %    Comment: Performed at [facility], [address] [place], [place] [address]  CBC with Differential     Status: Abnormal   Collection Time: 01/17/21  1:27 PM  Result Value Ref Range   WBC 9.1 4.0 - 10.5 K/uL   RBC 2.28 (L) 3.87 - 5.11 MIL/uL   Hemoglobin 6.3 (LL) 12.0 - 15.0 g/dL    Comment: CRITICAL RESULT CALLED TO, READ BACK BY AND VERIFIED WITH: SHANNON MCCARTNEY,RN @1425  01/17/2021 KAY    HCT 18.9 (L) 36.0 - 46.0 %   MCV 82.9 80.0 - 100.0 fL  MCH 27.6 26.0 - 34.0 pg   MCHC 33.3 30.0 - 36.0 g/dL   RDW 19.9 (H) 11.5 - 15.5 %   Platelets 121 (L) 150 - 400 K/uL   nRBC 0.7 (H) 0.0 - 0.2 %   Neutrophils Relative % 79 %   Lymphocytes Relative 17 %   Monocytes Relative 4 %   Eosinophils Relative 0 %   Basophils Relative 0 %   Band Neutrophils 0 %   Metamyelocytes Relative 0 %   Myelocytes 0 %   Promyelocytes Relative 0 %   Blasts 0 %   nRBC 2 (H) 0 /100 WBC   Other 0 %   RBC Morphology PRESENT    Smudge Cells PRESENT    Neutro Abs 7.2 1.7 - 7.7 K/uL   Lymphs Abs 1.5 0.7 - 4.0 K/uL   Monocytes Absolute 0.4 0.1 - 1.0 K/uL   Eosinophils Absolute 0.0 0.0 - 0.5 K/uL   Basophils Absolute 0.0 0.0 - 0.1 K/uL   Abs Immature Granulocytes 0.00 0.00 - 0.07 K/uL    Comment: Performed at [facility], [address], [place], [place] [address]  Prepare RBC (crossmatch)     Status: None   Collection Time: 01/17/21  2:40 PM  Result Value Ref Range   Order Confirmation      ORDER PROCESSED BY BLOOD BANK Performed at [facility], [address] [place], [place] [address]    DG Chest Port 1  View  Result Date: 01/01/2021 CLINICAL DATA:  Evaluate dialysis catheter EXAM: PORTABLE CHEST 1 VIEW COMPARISON:  11/16/2020 FINDINGS: Lungs are clear.  No pleural effusion or pneumothorax. The heart is normal in size. Right IJ dual lumen dialysis catheter with its distal tip at the cavoatrial junction. IMPRESSION: Right IJ dual lumen dialysis catheter with its distal tip at the cavoatrial junction. Electronically Signed   By: Julian Hy M.D.   On: 01/19/2021 16:51    Pending Labs Unresulted Labs (From admission, onward)          Start     Ordered   01/19/21 0500  Renal function panel  Tomorrow morning,   R        01/18/21 0737   01/18/21 0738  CBC  Once,   STAT        01/18/21 0737   01/25/2021 1515  CBC with Differential  Once,   STAT        01/02/2021 1515          Vitals/Pain Today's Vitals   01/18/21 0530 01/18/21 0600 01/18/21 0630 01/18/21 0730  BP: 116/76 117/72 (!) 131/93 106/70  Pulse: 76 76 85 79  Resp:   17 18  Temp:      TempSrc:      SpO2: 100% 100% 100% 100%  Weight:      Height:      PainSc:        Isolation Precautions Airborne and Contact precautions  Medications Medications  midodrine (PROAMATINE) tablet 10 mg (has no administration in time range)  pantoprazole (PROTONIX) EC tablet 40 mg (40 mg Oral Given 01/17/21 0949)  lipase/protease/amylase (CREON) capsule 36,000 Units (36,000 Units Oral Not Given 01/17/21 2244)  Chlorhexidine Gluconate Cloth 2 % PADS 6 each (6 each Topical Not Given 01/18/21 0519)  0.9 %  sodium chloride infusion (has no administration in time range)  0.9 %  sodium chloride infusion (has no administration in time range)  heparin injection 1,000 Units (has no administration in time range)  alteplase (CATHFLO ACTIVASE) injection 2 mg (has no administration in time range)  0.9 %  sodium chloride infusion (Manually program via Guardrails IV Fluids) ( Intravenous Not Given 01/17/21 1927)  alteplase (CATHFLO ACTIVASE) injection 3.2 mg  (3.2 mg Intracatheter Given 01/13/2021 1947)  0.9 %  sodium chloride infusion (10 mL/hr Intravenous New Bag/Given 01/17/21 0130)    Mobility non-ambulatory High fall risk   Focused Assessments    R Recommendations: See Admitting Provider Note  Report given to:   Additional Notes:

## 2021-01-18 NOTE — Care Management Obs Status (Signed)
[place] NOTIFICATION   Patient Details  Name: Tamara [name] MRN: [id number] Date of Birth: 07-14-1976   Medicare Observation Status Notification Given:  Yes    Natasha Bence, LCSW 01/18/2021, 11:07 AM

## 2021-01-18 NOTE — Consult Note (Addendum)
[facility] Renal Consultation Note  Requesting MD: Barton Dubois, MD Indication for Consultation:  ESRD  Chief complaint: malfunctioning HD catheter  HPI:  Tamara [name] is a 45 y.o. female with a history of end-stage renal disease, developmental delay, hypertension, depression, and recent diagnosis of COVID who presented after she was not able to run during dialysis. They reported to HD nursing that her catheter had been functioning poorly for a couple of weeks. Here the catheter initially flushed and demonstrated potential to function; it was then TPA'd overnight but was still not able to run the following day.  Surgery was consulted for a non tunneled catheter which Dr. Arnoldo Morale kindly placed. IR was consulted and as she has had multiple exchanges in the past with IR they feel that vascular surgery consult and transfer to [facility] is appropriate. She had previously been boarding in the ED as obs. The patient initially had a hemoglobin of 6 as well.  This did not improve with initial PRBC unit which went into an IV which infiltrated per staff. Once the dialysis catheter was used for the transfusion with HD her hemoglobin responded.  She was able to get HD here on 1/22.  She is not able to provide any additional history - history obtained per discussions with nursing, HD RN, and primary team.  Note HD consent given from [place].  Patient has been at the [facility].  Prior unit of [facility] where she has dialyzed TTS.  Note that per charting she was to return to her home clinic on 1/22 after 10 days of isolation.  She was retested at [facility] and was positive on 1/21.   PMHx:   Past Medical History:  Diagnosis Date  . Abnormal uterine bleeding (AUB) 01/26/2016  . Acute pancreatitis without necrosis or infection, unspecified   . Anemia in chronic kidney disease (CKD)   . Anxiety and depression    mentally slow  . Cardiac arrhythmia   . Chronic abdominal pain   .  COVID-19   . Dependence on renal dialysis ([place])   . Depression   . Gastro-esophageal reflux disease without esophagitis   . Generalized anxiety disorder   . Gout   . Hematuria 01/26/2016  . Hyperlipidemia   . Hypertension   . Hypomagnesemia   . Leg pain   . Major depressive disorder   . Obesity   . Ovarian cyst 02/10/2016  . Personal history of sudden cardiac arrest   . Pseudocyst of pancreas   . PUD (peptic ulcer disease)   . Pure hypercholesterolemia   . Reflux   . Renal disorder   . S/P colonoscopy 03/15/11   friable anal canal  . Sebaceous cyst of labia 01/26/2016  . Tachycardia   . Unspecified psychosis not due to a substance or known physiological condition ([place])   . Yeast vaginitis 09/19/2013    Past Surgical History:  Procedure Laterality Date  . IR FLUORO GUIDE CV LINE RIGHT  12/05/2020  . IR FLUORO GUIDE CV LINE RIGHT  12/12/2020  . IR FLUORO GUIDE CV LINE RIGHT  12/29/2020  . IR INTRAVASCULAR ULTRASOUND NON CORONARY  12/29/2020  . IR PTA VENOUS ADDL EXCEPT DIALYSIS CIRCUIT  12/29/2020  . IR PTA VENOUS EXCEPT DIALYSIS CIRCUIT  12/05/2020  . IR US GUIDE VASC ACCESS RIGHT  12/12/2020  . TOOTH EXTRACTION N/A 06/03/2014   Procedure: EXTRACTION WISDOM TEETH;  Surgeon: Gae Bon, DDS;  Location: [facility];  Service: Oral Surgery;  Laterality: N/A;  . TUBAL LIGATION      Family Hx:  Family History  Problem Relation Age of Onset  . Colon polyps Father   . Hypertension Father   . Diabetes Father   . Seizures Mother   . Hypertension Mother   . Diabetes Mother   . Diabetes Paternal Grandmother   . Colon cancer Neg Hx     Social History:  reports that she has never smoked. She has never used smokeless tobacco. She reports that she does not drink alcohol and does not use drugs.  Allergies:  Allergies  Allergen Reactions  . Penicillins   . Sulfa Antibiotics     Medications: Prior to Admission medications   Medication Sig Start Date End Date Taking? Authorizing  Provider  allopurinol (ZYLOPRIM) 300 MG tablet Take 300 mg by mouth daily.  09/05/13  Yes [provider]  ARIPiprazole (ABILIFY) 5 MG tablet Take 5 mg by mouth daily.   Yes [provider]  dicyclomine (BENTYL) 10 MG capsule Take 10 mg by mouth 4 (four) times daily -  before meals and at bedtime.   Yes [provider]  hydrOXYzine (VISTARIL) 25 MG capsule Take 25 mg by mouth 2 (two) times daily.   Yes [provider]  omeprazole (PRILOSEC) 20 MG capsule Take 20 mg by mouth daily.   Yes [provider]  ondansetron (ZOFRAN) 4 MG tablet Take 4 mg by mouth every 8 (eight) hours as needed. 12/19/20  Yes [provider]  Pancrelipase, Lip-Prot-Amyl, (CREON) 24000-76000 units CPEP Take 2 capsules by mouth in the morning, at noon, in the evening, and at bedtime.   Yes [provider]  polyethylene glycol powder (GLYCOLAX/MIRALAX) 17 GM/SCOOP powder Take 0.5 Containers by mouth.   Yes [provider]  sertraline (ZOLOFT) 100 MG tablet Take 100 mg by mouth daily. 01/15/21  Yes [provider]  acetaminophen (TYLENOL) 325 MG tablet Take 650 mg by mouth 4 (four) times daily as needed for moderate pain.     [provider]  ALPRAZolam Duanne Moron) 0.5 MG tablet Take 0.5 mg by mouth 3 (three) times daily as needed. Anxiety    [provider]  amLODIPine Besy-Benazepril HCl (LOTREL PO) Take 1 capsule by mouth daily. Patient not taking: No sig reported    [provider]  cetirizine (ZYRTEC) 10 MG tablet Take 10 mg by mouth daily.    [provider]  chlorthalidone (HYGROTON) 25 MG tablet Take 25 mg by mouth daily.    [provider]  cloNIDine (CATAPRES) 0.2 MG tablet Take 0.2 mg by mouth 3 (three) times daily.    [provider]  COLCRYS 0.6 MG tablet Take 0.6 mg by mouth 2 (two) times daily.  09/15/13   [provider]  colestipol (COLESTID) 1 g tablet Take 1 g by mouth in  the morning, at noon, and at bedtime.    [provider]  diclofenac Sodium (VOLTAREN) 1 % GEL Apply topically. 10/23/20   [provider]  doxycycline (VIBRAMYCIN) 100 MG capsule Take 100 mg by mouth 2 (two) times daily. 11/19/20   [provider]  DULoxetine (CYMBALTA) 60 MG capsule Take 60 mg by mouth at bedtime.    [provider]  epoetin alfa (EPOGEN) 4000 UNIT/ML injection Inject into the skin.    [provider]  furosemide (LASIX) 40 MG tablet Take 40 mg by mouth daily.    [provider]  HYDROcodone-acetaminophen (NORCO/VICODIN) 5-325 MG  per tablet Take 1 tablet by mouth 4 (four) times daily.  12/13/14   [provider]  lactulose (CHRONULAC) 10 GM/15ML solution Take 20 g by mouth at bedtime.    [provider]  medroxyPROGESTERone (PROVERA) 10 MG tablet TAKE 1 TABLET ONCE DAILY FOR 2 WEEKS. REPEAT EVERY 3 MONTHS Patient taking differently: Take 10 mg by mouth See admin instructions. Take 1 tablet for 2 weeks, repeat every 3 months 12/23/19   [name], MD  Melatonin CR 3 MG TBCR Take by mouth.    [provider]  metoCLOPramide (REGLAN) 5 MG tablet Take 5 mg by mouth 3 (three) times daily as needed for nausea or vomiting.  09/03/20   [provider]  metoprolol (LOPRESSOR) 100 MG tablet Take 100 mg by mouth 2 (two) times daily.  11/29/14   [provider]  metroNIDAZOLE (FLAGYL) 500 MG tablet Take 500 mg by mouth 2 (two) times daily. 11/19/20   [provider]  midodrine (PROAMATINE) 10 MG tablet Take 10 mg by mouth 3 (three) times a week.    [provider]  mirtazapine (REMERON) 15 MG tablet Take 15 mg by mouth at bedtime. 08/27/20   [provider]  ondansetron (ZOFRAN-ODT) 4 MG disintegrating tablet Take 4 mg by mouth every 8 (eight) hours as needed for nausea or vomiting. Patient not taking: No sig reported    [provider]   oxyCODONE-acetaminophen (PERCOCET/ROXICET) 5-325 MG tablet Take 2 tablets by mouth every 6 (six) hours as needed for moderate pain.  09/02/20   [provider]    I have reviewed the patient's provided med list -she is unable to verify.  Reviewed current meds.  Labs:  BMP Latest Ref Rng & Units 01/18/2021 01/17/2021 01/22/2021  Glucose 70 - 99 mg/dL 61(L) 78 77  BUN 6 - 20 mg/dL 12 25(H) 22(H)  Creatinine 0.44 - 1.00 mg/dL 3.43(H) 5.90(H) 5.57(H)  Sodium 135 - 145 mmol/L 136 139 133(L)  Potassium 3.5 - 5.1 mmol/L 3.0(L) 3.1(L) 2.8(L)  Chloride 98 - 111 mmol/L 98 107 103  CO2 22 - 32 mmol/L 22 18(L) 20(L)  Calcium 8.9 - 10.3 mg/dL 7.5(L) 7.6(L) 7.3(L)     ROS:  Unable to obtain secondary to altered mental status  Physical Exam: Vitals:   01/18/21 1500 01/18/21 1530  BP: 103/71 114/73  Pulse: 80 80  Resp:  18  Temp:    SpO2: 100% 100%     General: adult female in bed in NAD HEENT: NCAT Eyes: EOMI; sclera anicteric Neck: supple trachea midline Heart: S1S2 no rub Lungs: clear to auscultation bilaterally; unlabored at rest  Abdomen: soft/NT/ND Extremities: no edema ;no cyanosis or clubbing Skin: no rash on extremities exposed Neuro: oriented to person but doesn't know where we are other than "[place]" and "can't remember" the year Psych - no anxiety or agitation  Access: right groin nontunneled HD catheter; RIJ tunneled catheter  Assessment/Plan: # Malfunctioning HD catheter  - IR has recommended vascular surgery consult and transfer to [facility]  # ESRD  - Last HD on 1/22 - Assess dialysis needs daily   # Covid 19 - treatment and precautions per primary team   # Anemia - CKD and possible acute component  - Improved with PRBC's (last given through HD catheter with HD on 1/22) - primary team working up   # Developmental Delay - Note APS provided consent for HD  # Hypokalemia - gently repleted   # Hypophos  -  gently repleted   Claudia Desanctis 01/18/2021,  5:39 PM

## 2021-01-18 NOTE — ED Notes (Signed)
Pt nauseated and requesting medication.

## 2021-01-18 NOTE — ED Notes (Signed)
Report given to [facility]. 

## 2021-01-19 DIAGNOSIS — U071 COVID-19: Secondary | ICD-10-CM

## 2021-01-19 DIAGNOSIS — N186 End stage renal disease: Secondary | ICD-10-CM | POA: Diagnosis not present

## 2021-01-19 DIAGNOSIS — N185 Chronic kidney disease, stage 5: Secondary | ICD-10-CM

## 2021-01-19 DIAGNOSIS — D649 Anemia, unspecified: Secondary | ICD-10-CM | POA: Diagnosis not present

## 2021-01-19 DIAGNOSIS — Z789 Other specified health status: Secondary | ICD-10-CM | POA: Diagnosis not present

## 2021-01-19 LAB — GLUCOSE, CAPILLARY
Glucose-Capillary: 53 mg/dL — ABNORMAL LOW (ref 70–99)
Glucose-Capillary: 64 mg/dL — ABNORMAL LOW (ref 70–99)
Glucose-Capillary: 74 mg/dL (ref 70–99)
Glucose-Capillary: 82 mg/dL (ref 70–99)

## 2021-01-19 LAB — RENAL FUNCTION PANEL
Albumin: 1.8 g/dL — ABNORMAL LOW (ref 3.5–5.0)
Anion gap: 12 (ref 5–15)
BUN: 12 mg/dL (ref 6–20)
CO2: 23 mmol/L (ref 22–32)
Calcium: 7 mg/dL — ABNORMAL LOW (ref 8.9–10.3)
Chloride: 100 mmol/L (ref 98–111)
Creatinine, Ser: 3.85 mg/dL — ABNORMAL HIGH (ref 0.44–1.00)
GFR, Estimated: 14 mL/min — ABNORMAL LOW (ref >60)
Glucose, Bld: 60 mg/dL — ABNORMAL LOW (ref 70–99)
Phosphorus: 2.2 mg/dL — ABNORMAL LOW (ref 2.5–4.6)
Potassium: 3 mmol/L — ABNORMAL LOW (ref 3.5–5.1)
Sodium: 135 mmol/L (ref 135–145)

## 2021-01-19 LAB — TSH: TSH: 15.227 u[IU]/mL — ABNORMAL HIGH (ref 0.350–4.500)

## 2021-01-19 MED ORDER — HYDROXYZINE PAMOATE 25 MG PO CAPS
25.0000 mg | ORAL_CAPSULE | Freq: Two times a day (BID) | ORAL | Status: DC
  Filled 2021-01-19 (×2): qty 1

## 2021-01-19 MED ORDER — DM-GUAIFENESIN ER 30-600 MG PO TB12
1.0000 | ORAL_TABLET | Freq: Two times a day (BID) | ORAL | Status: DC | PRN
  Filled 2021-01-19: qty 1

## 2021-01-19 MED ORDER — SERTRALINE HCL 100 MG PO TABS
100.0000 mg | ORAL_TABLET | Freq: Every day | ORAL | Status: DC
  Administered 2021-01-19 – 2021-01-22 (×4): 100 mg via ORAL
  Filled 2021-01-19 (×4): qty 1

## 2021-01-19 MED ORDER — ARIPIPRAZOLE 10 MG PO TABS
5.0000 mg | ORAL_TABLET | Freq: Every day | ORAL | Status: DC
  Administered 2021-01-19 – 2021-01-22 (×4): 5 mg via ORAL
  Filled 2021-01-19 (×4): qty 1

## 2021-01-19 MED ORDER — DEXTROSE 50 % IV SOLN: 25.0000 g | Freq: Once | INTRAVENOUS | Status: DC

## 2021-01-19 MED ORDER — HYDROXYZINE HCL 25 MG PO TABS
25.0000 mg | ORAL_TABLET | Freq: Two times a day (BID) | ORAL | Status: DC
  Administered 2021-01-19 – 2021-01-22 (×7): 25 mg via ORAL
  Filled 2021-01-19 (×7): qty 1

## 2021-01-19 MED ORDER — GLUCAGON HCL RDNA (DIAGNOSTIC) 1 MG IJ SOLR
1.0000 mg | Freq: Once | INTRAMUSCULAR | Status: AC | PRN
  Administered 2021-01-21: 1 mg via INTRAVENOUS
  Filled 2021-01-19: qty 1

## 2021-01-19 MED ORDER — GLUCOSE 4 G PO CHEW
CHEWABLE_TABLET | ORAL | Status: AC
  Administered 2021-01-19: 4 g
  Filled 2021-01-19: qty 1

## 2021-01-19 MED ORDER — SENNOSIDES-DOCUSATE SODIUM 8.6-50 MG PO TABS: 1.0000 | ORAL_TABLET | Freq: Every evening | ORAL | Status: DC | PRN

## 2021-01-19 MED ORDER — ACETAMINOPHEN 325 MG PO TABS
650.0000 mg | ORAL_TABLET | Freq: Four times a day (QID) | ORAL | Status: DC | PRN
  Administered 2021-01-21: 21:00:00 650 mg via ORAL
  Filled 2021-01-19 (×2): qty 2

## 2021-01-19 NOTE — Progress Notes (Signed)
[facility] Progress Note  Subjective: seen in room, wants to "go to the hospital". No sob , cough or fever.   History relevant info at time of admission: pt w/ history of end-stage renal disease, developmental delay, hypertension, depression, and recent diagnosis of COVID who presented after she was not able to run during dialysis. They reported to HD nursing that her catheter had been functioning poorly for a couple of weeks. Here the catheter initially flushed and demonstrated potential to function; it was TPA'd overnight but didn't run the following day. Dr Arnoldo Morale kindly placed a tunneled HD cath.  IR was consulted and as she has had multiple exchanges in the past with IR they feel that vascular surgery consult and transfer to [facility] was appropriate. She had previously been boarding in the ED as obs. The patient initially had a hemoglobin of 6 as well.  This did not improve with initial PRBC unit which went into an IV which infiltrated per staff. Once the dialysis catheter was used for the transfusion with HD her hemoglobin responded.  She was able to get HD at [facility] on 1/22.  She is not able to provide any additional history - history obtained per discussions with nursing, HD RN, and primary team.  Note HD consent given from [place].  Patient has been at the [facility].  Prior unit of [facility] where she has dialyzed TTS.  Note that per charting she was to return to her home clinic on 1/22 after 10 days of isolation.  She was retested at [facility] and was positive on 1/21.   Vitals:   01/18/21 1930 01/18/21 2050 01/19/21 0157 01/19/21 0500  BP: 93/67 109/73 105/70 108/70  Pulse: 90 91 90 88  Resp: 16 18 16 16   Temp:  97.6 F (36.4 C) 98.1 F (36.7 C) 98.7 F (37.1 C)  TempSrc:  Oral Oral Oral  SpO2: 100% (!) 77% 99% 100%  Weight:      Height:        Exam:   alert, nad   no jvd  Chest cta bilat  Cor reg no RG  Abd soft ntnd no ascites   Ext no LE  edema   Alert, NF, ox3   R IJ TDC and R fem temp HD cath    OP HD: [facility] TTS, get records   Assessment/Plan: # Malfunctioning HD catheter   - IR recommended getting VVS involved for long-term access, we are consulting VVS  - has functioning R fem temp cath in place right now - has still in a R IJ TDC, not sure if functional or not   # ESRD on HD TTS - last HD was on Sat 1/22, labs here are good - HD tomorrow  # Covid 19 - treatment and precautions per primary team   # Anemia - CKD and possible acute component  - Improved with PRBC's (last given through HD catheter with HD on 1/22) - primary team working up   # Developmental Delay - Note APS provided consent for HD  # Hypokalemia - gently repleted   # Hypophos  - gently repleted   Rob [facility] [place] 01/19/2021, 3:12 PM   Recent Labs  Lab 01/17/21 1327 01/18/21 0738 01/19/21 0247  K  --  3.0* 3.0*  BUN  --  12 12  CREATININE  --  3.43* 3.85*  CALCIUM  --  7.5* 7.0*  PHOS  --  1.9* 2.2*  HGB 6.3* 9.8*  --  Inpatient medications: . sodium chloride   Intravenous Once  . ARIPiprazole  5 mg Oral Daily  . Chlorhexidine Gluconate Cloth  6 each Topical Q0600  . dextrose  25 g Intravenous Once  . hydrOXYzine  25 mg Oral BID  . lipase/protease/amylase  36,000 Units Oral TID  . midodrine  10 mg Oral Once per day on Mon Wed Fri  . pantoprazole  40 mg Oral Daily  . sertraline  100 mg Oral Daily   . sodium chloride    . sodium chloride     sodium chloride, sodium chloride, acetaminophen, alteplase, dextromethorphan-guaiFENesin, glucagon (human recombinant), heparin, prochlorperazine, senna-docusate

## 2021-01-19 NOTE — Progress Notes (Signed)
PROGRESS NOTE    Tamara [name]  FTD:[id number] DOB: 1976-08-29 DOA: 01/24/2021 PCP: Hal Morales, DO   Brief Narrative:  45 year old with history of ESRD HD, developmental delay/diminished mental capacity, HTN, COVID-19 with ARDS February 2021, depression, anxiety admitted for malfunction of hemodialysis catheter initially at [facility] was unsuccessful therefore general surgery placed nontunneled catheter and patient was transferred to [facility] for vascular surgery evaluation.  Also incidental finding of COVID-19 infection, largely asymptomatic.   Assessment & Plan:   Active Problems:   ESRD (end stage renal disease) (HCC)   Symptomatic anemia   Problem with vascular access   Symptomatic anemia -No obvious evidence of bleeding.  Likely worsening of her chronic anemia.  Status post PRBC transfusion.  ESRD on HD Malfunctioning HD access -Failed TPA, nontunneled catheter placed by general surgery at any [facility].  Transferred to [facility] for vascular evaluation.  Nephrology following.  Hypoglycemia -Patient is not on any insulin.  Secondary to poor oral intake.  Will give glucagon/amp D50 as needed.  Encourage oral diet.  Check TSH and cortisol levels  Essential hypertension -Currently not getting anything.  Supportive care.  Gets midodrine during dialysis  COVID-19 infection, asymptomatic -Initially tested positive-01/06/2021.  As documented results. -We will discontinue isolation. -Continue supportive care.  Saturating well on room air.  Chronic pancreatitis -On Creon  DVT prophylaxis: scds Code Status: Full Family Communication: Called her legal guardian agency-spoke with their director who informed me about patient's results.  Status is: Inpatient  Remains inpatient appropriate because:Inpatient level of care appropriate due to severity of illness   Dispo: The patient is from: ALF              Anticipated d/c is to: ALF               Anticipated d/c date is: 2 days              Patient currently is not medically stable to d/c.   Difficult to place patient No       Body mass index is 25.68 kg/m.   Subjective: Sitting up in the bed attempting to eat her breakfast.  No complaints.  Very poor historian given her mental disability  I also spoke with the patient's legal guardian [employer].  They have been updated.  Review of Systems Otherwise negative except as per HPI, including: General: Denies fever, chills, night sweats or unintended weight loss. Resp: Denies cough, wheezing, shortness of breath. Cardiac: Denies chest pain, palpitations, orthopnea, paroxysmal nocturnal dyspnea. GI: Denies abdominal pain, nausea, vomiting, diarrhea or constipation GU: Denies dysuria, frequency, hesitancy or incontinence MS: Denies muscle aches, joint pain or swelling Neuro: Denies headache, neurologic deficits (focal weakness, numbness, tingling), abnormal gait Psych: Denies anxiety, depression, SI/HI/AVH Skin: Denies new rashes or lesions ID: Denies sick contacts, exotic exposures, travel  Examination:  General exam: Chronically frail appearing.  HD catheter site noted. Respiratory system: Clear to auscultation. Respiratory effort normal. Cardiovascular system: S1 & S2 heard, RRR. No JVD, murmurs, rubs, gallops or clicks. No pedal edema. Gastrointestinal system: Abdomen is nondistended, soft and nontender. No organomegaly or masses felt. Normal bowel sounds heard. Central nervous system: Alert to name only.  No focal deficits Extremities: Symmetric 5 x 5 power. Skin: No rashes, lesions or ulcers Psychiatry: Poor judgment and insight  Right groin nontunneled HD catheter.  Objective: Vitals:   01/18/21 1930 01/18/21 2050 01/19/21 0157 01/19/21 0500  BP: 93/67 109/73 105/70 108/70  Pulse: 90 91 90 88  Resp: 16 18 16 16   Temp:  97.6 F (36.4 C) 98.1 F (36.7 C) 98.7 F (37.1 C)  TempSrc:  Oral Oral Oral  SpO2:  100% (!) 77% 99% 100%  Weight:      Height:        Intake/Output Summary (Last 24 hours) at 01/19/2021 1105 Last data filed at 01/19/2021 1100 Gross per 24 hour  Intake 600 ml  Output --  Net 600 ml   Filed Weights   01/19/2021 1222  Weight: 70 kg     Data Reviewed:   CBC: Recent Labs  Lab 01/24/2021 1732 01/17/21 0500 01/17/21 1327 01/18/21 0738  WBC 8.2 8.2 9.1 12.1*  NEUTROABS 4.0  --  7.2  --   HGB 6.0* 5.9* 6.3* 9.8*  HCT 18.0* 18.3* 18.9* 28.2*  MCV 84.1 85.5 82.9 81.3  PLT 134* 114* 121* 831*   Basic Metabolic Panel: Recent Labs  Lab 01/10/2021 1631 01/17/21 0500 01/18/21 0738 01/19/21 0247  NA 133* 139 136 135  K 2.8* 3.1* 3.0* 3.0*  CL 103 107 98 100  CO2 20* 18* 22 23  GLUCOSE 77 78 61* 60*  BUN 22* 25* 12 12  CREATININE 5.57* 5.90* 3.43* 3.85*  CALCIUM 7.3* 7.6* 7.5* 7.0*  PHOS  --   --  1.9* 2.2*   GFR: Estimated Creatinine Clearance: 18.5 mL/min (A) (by C-G formula based on SCr of 3.85 mg/dL (H)). Liver Function Tests: Recent Labs  Lab 01/18/21 0738 01/19/21 0247  ALBUMIN 2.3* 1.8*   No results for input(s): LIPASE, AMYLASE in the last 168 hours. No results for input(s): AMMONIA in the last 168 hours. Coagulation Profile: No results for input(s): INR, PROTIME in the last 168 hours. Cardiac Enzymes: No results for input(s): CKTOTAL, CKMB, CKMBINDEX, TROPONINI in the last 168 hours. BNP (last 3 results) No results for input(s): PROBNP in the last 8760 hours. HbA1C: No results for input(s): HGBA1C in the last 72 hours. CBG: Recent Labs  Lab 01/18/21 2108 01/19/21 0811 01/19/21 0858  GLUCAP 58* 53* 64*   Lipid Profile: No results for input(s): CHOL, HDL, LDLCALC, TRIG, CHOLHDL, LDLDIRECT in the last 72 hours. Thyroid Function Tests: No results for input(s): TSH, T4TOTAL, FREET4, T3FREE, THYROIDAB in the last 72 hours. Anemia Panel: No results for input(s): VITAMINB12, FOLATE, FERRITIN, TIBC, IRON, RETICCTPCT in the last 72  hours. Sepsis Labs: No results for input(s): PROCALCITON, LATICACIDVEN in the last 168 hours.  Recent Results (from the past 240 hour(s))  SARS Coronavirus 2 by RT PCR (hospital order, performed in [facility] hospital lab) Nasopharyngeal Nasopharyngeal Swab     Status: Abnormal   Collection Time: 01/07/2021  8:29 PM   Specimen: Nasopharyngeal Swab  Result Value Ref Range Status   SARS Coronavirus 2 POSITIVE (A) NEGATIVE Final    Comment: RESULT CALLED TO, READ BACK BY AND VERIFIED WITH: C. TURNER 1/21 @ 2232 BY S. BEARD. (NOTE) SARS-CoV-2 target nucleic acids are DETECTED  SARS-CoV-2 RNA is generally detectable in upper respiratory specimens  during the acute phase of infection.  Positive results are indicative  of the presence of the identified virus, but do not rule out bacterial infection or co-infection with other pathogens not detected by the test.  Clinical correlation with patient history and  other diagnostic information is necessary to determine patient infection status.  The expected result is negative.  Fact Sheet for Patients:   StrictlyIdeas.no   Fact Sheet for Healthcare Providers:  BankingDealers.co.za    This test is not yet approved or cleared by the [place] and  has been authorized for detection and/or diagnosis of SARS-CoV-2 by FDA under an Emergency Use Authorization (EUA).  This EUA will remain in effect (meaning this te st can be used) for the duration of  the COVID-19 declaration under Section 564(b)(1) of the Act, 21 U.S.C. section 360-bbb-3(b)(1), unless the authorization is terminated or revoked sooner.  Performed at [facility], [address] [place], [place] [address]          Radiology Studies: No results found.      Scheduled Meds: . sodium chloride   Intravenous Once  . Chlorhexidine Gluconate Cloth  6 each Topical Q0600  . dextrose  25 g Intravenous Once  .  lipase/protease/amylase  36,000 Units Oral TID  . midodrine  10 mg Oral Once per day on Mon Wed Fri  . pantoprazole  40 mg Oral Daily   Continuous Infusions: . sodium chloride    . sodium chloride       LOS: 1 day   Time spent= 35 mins    Satsuki Zillmer Arsenio Loader, MD [facility]  If 7PM-7AM, please contact night-coverage  01/19/2021, 11:05 AM

## 2021-01-19 NOTE — Progress Notes (Signed)
Pt has been having hypoglycemic episodes, cbg 53 this morning. Orange juice given and sugar only went up to 64. Oral diet and fluid intake very poor, needs lots of encouragement to drink. Drank about 50cc milk and will push some more orange juice and recheck. Pt has no working iv access, IV team aware

## 2021-01-19 NOTE — Consult Note (Signed)
Referring Physician: Dr. Jonnie Finner renal service  Patient name: Tamara [name] MRN: [id number] DOB: 1976/04/27 Sex: female  REASON FOR CONSULT: Hemodialysis access  HPI: Tamara [name] is a 45 y.o. female, who has been ordered.  She is an unreliable historian.  She apparently has been on hemodialysis for several months.  This has been through a right sided catheter.  She was scheduled for an evaluation outpatient for upper extremity long-term hemodialysis access.  However, that office visit was canceled because she was tachycardic and short of breath.  She was then lost to follow-up.  She has had multiple interventions on her right-sided dialysis catheter by interventional radiology.  Her catheter was placed by interventional radiology September 2021.  He was then exchanged in December on 2 prior occasions.  It was then exchanged again this month.  He eventually had a right femoral temporary dialysis catheter placed by general surgery team [facility] a few days ago.  Spoke with Dr. Soyla Murphy this evening.  He states he thinks that the right femoral temporary catheter is functional.  The plan is to dialyze her tomorrow using either her tunneled dialysis catheter in the right internal jugular vein or her femoral temporary catheter.  Patient also is currently Covid positive with intermittent coughing mildly symptomatic.  She has not had any prior upper extremity hemodialysis access procedures.  Past Medical History:  Diagnosis Date  . Abnormal uterine bleeding (AUB) 01/26/2016  . Acute pancreatitis without necrosis or infection, unspecified   . Anemia in chronic kidney disease (CKD)   . Anxiety and depression    mentally slow  . Cardiac arrhythmia   . Chronic abdominal pain   . COVID-19   . Dependence on renal dialysis ([place])   . Depression   . Gastro-esophageal reflux disease without esophagitis   . Generalized anxiety disorder   . Gout   . Hematuria 01/26/2016  . Hyperlipidemia   .  Hypertension   . Hypomagnesemia   . Leg pain   . Major depressive disorder   . Obesity   . Ovarian cyst 02/10/2016  . Personal history of sudden cardiac arrest   . Pseudocyst of pancreas   . PUD (peptic ulcer disease)   . Pure hypercholesterolemia   . Reflux   . Renal disorder   . S/P colonoscopy 03/15/11   friable anal canal  . Sebaceous cyst of labia 01/26/2016  . Tachycardia   . Unspecified psychosis not due to a substance or known physiological condition ([place])   . Yeast vaginitis 09/19/2013   Past Surgical History:  Procedure Laterality Date  . IR FLUORO GUIDE CV LINE RIGHT  12/05/2020  . IR FLUORO GUIDE CV LINE RIGHT  12/12/2020  . IR FLUORO GUIDE CV LINE RIGHT  12/29/2020  . IR INTRAVASCULAR ULTRASOUND NON CORONARY  12/29/2020  . IR PTA VENOUS ADDL EXCEPT DIALYSIS CIRCUIT  12/29/2020  . IR PTA VENOUS EXCEPT DIALYSIS CIRCUIT  12/05/2020  . IR US GUIDE VASC ACCESS RIGHT  12/12/2020  . TOOTH EXTRACTION N/A 06/03/2014   Procedure: EXTRACTION WISDOM TEETH;  Surgeon: Gae Bon, DDS;  Location: [place];  Service: Oral Surgery;  Laterality: N/A;  . TUBAL LIGATION      Family History  Problem Relation Age of Onset  . Colon polyps Father   . Hypertension Father   . Diabetes Father   . Seizures Mother   . Hypertension Mother   . Diabetes Mother   . Diabetes Paternal Grandmother   .  Colon cancer Neg Hx     SOCIAL HISTORY: Social History   Socioeconomic History  . Marital status: Single    Spouse name: Not on file  . Number of children: 0  . Years of education: Not on file  . Highest education level: Not on file  Occupational History  . Occupation: diabled    Employer: NOT EMPLOYED  Tobacco Use  . Smoking status: Never Smoker  . Smokeless tobacco: Never Used  Vaping Use  . Vaping Use: Never used  Substance and Sexual Activity  . Alcohol use: No  . Drug use: No  . Sexual activity: Not Currently    Birth control/protection: Surgical    Comment: tubal  Other Topics  Concern  . Not on file  Social History Narrative  . Not on file   Social Determinants of Health   Financial Resource Strain: Not on file  Food Insecurity: Not on file  Transportation Needs: Not on file  Physical Activity: Not on file  Stress: Not on file  Social Connections: Not on file  Intimate Partner Violence: Not on file    Allergies  Allergen Reactions  . Penicillins   . Sulfa Antibiotics     Current Facility-Administered Medications  Medication Dose Route Frequency Provider Last Rate Last Admin  . 0.9 %  sodium chloride infusion (Manually program via Guardrails IV Fluids)   Intravenous Once Barton Dubois, MD      . 0.9 %  sodium chloride infusion  100 mL Intravenous PRN Barton Dubois, MD      . 0.9 %  sodium chloride infusion  100 mL Intravenous PRN Barton Dubois, MD      . acetaminophen (TYLENOL) tablet 650 mg  650 mg Oral Q6H PRN Amin, Ankit Chirag, MD      . alteplase (CATHFLO ACTIVASE) injection 2 mg  2 mg Intracatheter Once PRN Barton Dubois, MD      . ARIPiprazole (ABILIFY) tablet 5 mg  5 mg Oral Daily Amin, Ankit Chirag, MD   5 mg at 01/19/21 1308  . Chlorhexidine Gluconate Cloth 2 % PADS 6 each  6 each Topical Q0600 Barton Dubois, MD   6 each at 01/19/21 [phone]  . dextromethorphan-guaiFENesin (MUCINEX DM) 30-600 MG per 12 hr tablet 1 tablet  1 tablet Oral BID PRN Amin, Ankit Chirag, MD      . dextrose 50 % solution 25 g  25 g Intravenous Once Amin, Ankit Chirag, MD      . glucagon (human recombinant) (GLUCAGEN) injection 1 mg  1 mg Intravenous Once PRN Amin, Ankit Chirag, MD      . heparin injection 1,000 Units  1,000 Units Dialysis PRN Barton Dubois, MD      . hydrOXYzine (ATARAX/VISTARIL) tablet 25 mg  25 mg Oral BID Amin, Ankit Chirag, MD   25 mg at 01/19/21 1355  . lipase/protease/amylase (CREON) capsule 36,000 Units  36,000 Units Oral TID Barton Dubois, MD   36,000 Units at 01/19/21 1645  . midodrine (PROAMATINE) tablet 10 mg  10 mg Oral Once per day on  Mon Wed Fri [place], Carlos, MD   10 mg at 01/19/21 1059  . pantoprazole (PROTONIX) EC tablet 40 mg  40 mg Oral Daily Barton Dubois, MD   40 mg at 01/19/21 1053  . prochlorperazine (COMPAZINE) injection 10 mg  10 mg Intravenous Q6H PRN Barton Dubois, MD   10 mg at 01/18/21 1751  . senna-docusate (Senokot-S) tablet 1 tablet  1 tablet Oral QHS PRN Amin, Ankit  Chirag, MD      . sertraline (ZOLOFT) tablet 100 mg  100 mg Oral Daily Amin, Ankit Chirag, MD   100 mg at 01/19/21 1308    ROS:   Unable to obtain patient is a poor historian  Physical Examination  Vitals:   01/18/21 1930 01/18/21 2050 01/19/21 0157 01/19/21 0500  BP: 93/67 109/73 105/70 108/70  Pulse: 90 91 90 88  Resp: 16 18 16 16   Temp:  97.6 F (36.4 C) 98.1 F (36.7 C) 98.7 F (37.1 C)  TempSrc:  Oral Oral Oral  SpO2: 100% (!) 77% 99% 100%  Weight:      Height:        Body mass index is 25.68 kg/m.  General:  Alert and oriented to person but not to time or place HEENT: Normal Neck: Right-sided tunneled dialysis catheter infraclavicular right side no drainage Cardiac: Regular Rate and Rhythm Skin: No rash diffuse ecchymosis right upper arm extending from the deltoid region all the way the antecubital region no focal hematoma Extremity Pulses: Absent bilateral radial, 1-2+ brachial, plus femoral, absent dorsalis pedis, posterior tibial pulses bilaterally Musculoskeletal: 1-2+ pretibial and pedal edema bilaterally  Neurologic: Upper and lower extremity motor 5/5 and symmetric  DATA:  CBC    Component Value Date/Time   WBC 12.1 (H) 01/18/2021 0738   RBC 3.47 (L) 01/18/2021 0738   HGB 9.8 (L) 01/18/2021 0738   HCT 28.2 (L) 01/18/2021 0738   PLT 120 (L) 01/18/2021 0738   MCV 81.3 01/18/2021 0738   MCH 28.2 01/18/2021 0738   MCHC 34.8 01/18/2021 0738   RDW 17.7 (H) 01/18/2021 0738   LYMPHSABS 1.5 01/17/2021 1327   MONOABS 0.4 01/17/2021 1327   EOSABS 0.0 01/17/2021 1327   BASOSABS 0.0 01/17/2021 1327     BMET    Component Value Date/Time   NA 135 01/19/2021 0247   K 3.0 (L) 01/19/2021 0247   CL 100 01/19/2021 0247   CO2 23 01/19/2021 0247   GLUCOSE 60 (L) 01/19/2021 0247   BUN 12 01/19/2021 0247   CREATININE 3.85 (H) 01/19/2021 0247   CREATININE 0.99 02/08/2017 1129   CALCIUM 7.0 (L) 01/19/2021 0247   GFRNONAA 14 (L) 01/19/2021 0247   GFRAA 8 (L) 09/12/2020 1905     ASSESSMENT: Patient needs long-term hemodialysis access.  She will also need temporary dialysis access with a tunneled dialysis catheter something functioning.  Her prescription is going to try one of her catheters tomorrow to see if they can get her dialyzed.  If successful I believe the first step in the process would be to remove her right tunneled dialysis catheter.  She would even need bilateral upper extremity central venogram to make sure that she does not have a outflow stenosis of her superior vena cava which has been previously angioplastied by interventional radiology.  She will also need bilateral upper extremity vein mapping.  Since she does not have a normal radial pulse she also needs bilateral upper extremity arterial duplex scan.  Since she may potentially need a thigh graft we will also order bilateral ABIs.  If her right femoral temporary dialysis catheter is not functional tomorrow most likely the best first choice for short-term dialysis would be to convert this to a tunneled dialysis catheter in the right femoral region so that we can fully examine her upper extremities to see what her central vein situation is.  We will follow up with her tomorrow after she is dialyzed to make further assessment.  Ruta Hinds, MD [facility] Office: [phone]

## 2021-01-19 NOTE — H&P (View-Only) (Signed)
Referring Physician: Dr. Jonnie Finner renal service  Patient name: Tamara [name] MRN: [id number] DOB: 1976/09/08 Sex: female  REASON FOR CONSULT: Hemodialysis access  HPI: Tamara [name] is a 45 y.o. female, who has been ordered.  She is an unreliable historian.  She apparently has been on hemodialysis for several months.  This has been through a right sided catheter.  She was scheduled for an evaluation outpatient for upper extremity long-term hemodialysis access.  However, that office visit was canceled because she was tachycardic and short of breath.  She was then lost to follow-up.  She has had multiple interventions on her right-sided dialysis catheter by interventional radiology.  Her catheter was placed by interventional radiology September 2021.  He was then exchanged in December on 2 prior occasions.  It was then exchanged again this month.  He eventually had a right femoral temporary dialysis catheter placed by general surgery team [facility] a few days ago.  Spoke with Dr. Soyla Murphy this evening.  He states he thinks that the right femoral temporary catheter is functional.  The plan is to dialyze her tomorrow using either her tunneled dialysis catheter in the right internal jugular vein or her femoral temporary catheter.  Patient also is currently Covid positive with intermittent coughing mildly symptomatic.  She has not had any prior upper extremity hemodialysis access procedures.  Past Medical History:  Diagnosis Date  . Abnormal uterine bleeding (AUB) 01/26/2016  . Acute pancreatitis without necrosis or infection, unspecified   . Anemia in chronic kidney disease (CKD)   . Anxiety and depression    mentally slow  . Cardiac arrhythmia   . Chronic abdominal pain   . COVID-19   . Dependence on renal dialysis ([place])   . Depression   . Gastro-esophageal reflux disease without esophagitis   . Generalized anxiety disorder   . Gout   . Hematuria 01/26/2016  . Hyperlipidemia   .  Hypertension   . Hypomagnesemia   . Leg pain   . Major depressive disorder   . Obesity   . Ovarian cyst 02/10/2016  . Personal history of sudden cardiac arrest   . Pseudocyst of pancreas   . PUD (peptic ulcer disease)   . Pure hypercholesterolemia   . Reflux   . Renal disorder   . S/P colonoscopy 03/15/11   friable anal canal  . Sebaceous cyst of labia 01/26/2016  . Tachycardia   . Unspecified psychosis not due to a substance or known physiological condition ([place])   . Yeast vaginitis 09/19/2013   Past Surgical History:  Procedure Laterality Date  . IR FLUORO GUIDE CV LINE RIGHT  12/05/2020  . IR FLUORO GUIDE CV LINE RIGHT  12/12/2020  . IR FLUORO GUIDE CV LINE RIGHT  12/29/2020  . IR INTRAVASCULAR ULTRASOUND NON CORONARY  12/29/2020  . IR PTA VENOUS ADDL EXCEPT DIALYSIS CIRCUIT  12/29/2020  . IR PTA VENOUS EXCEPT DIALYSIS CIRCUIT  12/05/2020  . IR US GUIDE VASC ACCESS RIGHT  12/12/2020  . TOOTH EXTRACTION N/A 06/03/2014   Procedure: EXTRACTION WISDOM TEETH;  Surgeon: Gae Bon, DDS;  Location: [place];  Service: Oral Surgery;  Laterality: N/A;  . TUBAL LIGATION      Family History  Problem Relation Age of Onset  . Colon polyps Father   . Hypertension Father   . Diabetes Father   . Seizures Mother   . Hypertension Mother   . Diabetes Mother   . Diabetes Paternal Grandmother   .  Colon cancer Neg Hx     SOCIAL HISTORY: Social History   Socioeconomic History  . Marital status: Single    Spouse name: Not on file  . Number of children: 0  . Years of education: Not on file  . Highest education level: Not on file  Occupational History  . Occupation: diabled    Employer: NOT EMPLOYED  Tobacco Use  . Smoking status: Never Smoker  . Smokeless tobacco: Never Used  Vaping Use  . Vaping Use: Never used  Substance and Sexual Activity  . Alcohol use: No  . Drug use: No  . Sexual activity: Not Currently    Birth control/protection: Surgical    Comment: tubal  Other Topics  Concern  . Not on file  Social History Narrative  . Not on file   Social Determinants of Health   Financial Resource Strain: Not on file  Food Insecurity: Not on file  Transportation Needs: Not on file  Physical Activity: Not on file  Stress: Not on file  Social Connections: Not on file  Intimate Partner Violence: Not on file    Allergies  Allergen Reactions  . Penicillins   . Sulfa Antibiotics     Current Facility-Administered Medications  Medication Dose Route Frequency Provider Last Rate Last Admin  . 0.9 %  sodium chloride infusion (Manually program via Guardrails IV Fluids)   Intravenous Once Barton Dubois, MD      . 0.9 %  sodium chloride infusion  100 mL Intravenous PRN Barton Dubois, MD      . 0.9 %  sodium chloride infusion  100 mL Intravenous PRN Barton Dubois, MD      . acetaminophen (TYLENOL) tablet 650 mg  650 mg Oral Q6H PRN Amin, Ankit Chirag, MD      . alteplase (CATHFLO ACTIVASE) injection 2 mg  2 mg Intracatheter Once PRN Barton Dubois, MD      . ARIPiprazole (ABILIFY) tablet 5 mg  5 mg Oral Daily Amin, Ankit Chirag, MD   5 mg at 01/19/21 1308  . Chlorhexidine Gluconate Cloth 2 % PADS 6 each  6 each Topical Q0600 Barton Dubois, MD   6 each at 01/19/21 [phone]  . dextromethorphan-guaiFENesin (MUCINEX DM) 30-600 MG per 12 hr tablet 1 tablet  1 tablet Oral BID PRN Amin, Ankit Chirag, MD      . dextrose 50 % solution 25 g  25 g Intravenous Once Amin, Ankit Chirag, MD      . glucagon (human recombinant) (GLUCAGEN) injection 1 mg  1 mg Intravenous Once PRN Amin, Ankit Chirag, MD      . heparin injection 1,000 Units  1,000 Units Dialysis PRN Barton Dubois, MD      . hydrOXYzine (ATARAX/VISTARIL) tablet 25 mg  25 mg Oral BID Amin, Ankit Chirag, MD   25 mg at 01/19/21 1355  . lipase/protease/amylase (CREON) capsule 36,000 Units  36,000 Units Oral TID Barton Dubois, MD   36,000 Units at 01/19/21 1645  . midodrine (PROAMATINE) tablet 10 mg  10 mg Oral Once per day on  Mon Wed Fri [place], Carlos, MD   10 mg at 01/19/21 1059  . pantoprazole (PROTONIX) EC tablet 40 mg  40 mg Oral Daily Barton Dubois, MD   40 mg at 01/19/21 1053  . prochlorperazine (COMPAZINE) injection 10 mg  10 mg Intravenous Q6H PRN Barton Dubois, MD   10 mg at 01/18/21 1751  . senna-docusate (Senokot-S) tablet 1 tablet  1 tablet Oral QHS PRN Amin, Ankit  Chirag, MD      . sertraline (ZOLOFT) tablet 100 mg  100 mg Oral Daily Amin, Ankit Chirag, MD   100 mg at 01/19/21 1308    ROS:   Unable to obtain patient is a poor historian  Physical Examination  Vitals:   01/18/21 1930 01/18/21 2050 01/19/21 0157 01/19/21 0500  BP: 93/67 109/73 105/70 108/70  Pulse: 90 91 90 88  Resp: 16 18 16 16   Temp:  97.6 F (36.4 C) 98.1 F (36.7 C) 98.7 F (37.1 C)  TempSrc:  Oral Oral Oral  SpO2: 100% (!) 77% 99% 100%  Weight:      Height:        Body mass index is 25.68 kg/m.  General:  Alert and oriented to person but not to time or place HEENT: Normal Neck: Right-sided tunneled dialysis catheter infraclavicular right side no drainage Cardiac: Regular Rate and Rhythm Skin: No rash diffuse ecchymosis right upper arm extending from the deltoid region all the way the antecubital region no focal hematoma Extremity Pulses: Absent bilateral radial, 1-2+ brachial, plus femoral, absent dorsalis pedis, posterior tibial pulses bilaterally Musculoskeletal: 1-2+ pretibial and pedal edema bilaterally  Neurologic: Upper and lower extremity motor 5/5 and symmetric  DATA:  CBC    Component Value Date/Time   WBC 12.1 (H) 01/18/2021 0738   RBC 3.47 (L) 01/18/2021 0738   HGB 9.8 (L) 01/18/2021 0738   HCT 28.2 (L) 01/18/2021 0738   PLT 120 (L) 01/18/2021 0738   MCV 81.3 01/18/2021 0738   MCH 28.2 01/18/2021 0738   MCHC 34.8 01/18/2021 0738   RDW 17.7 (H) 01/18/2021 0738   LYMPHSABS 1.5 01/17/2021 1327   MONOABS 0.4 01/17/2021 1327   EOSABS 0.0 01/17/2021 1327   BASOSABS 0.0 01/17/2021 1327     BMET    Component Value Date/Time   NA 135 01/19/2021 0247   K 3.0 (L) 01/19/2021 0247   CL 100 01/19/2021 0247   CO2 23 01/19/2021 0247   GLUCOSE 60 (L) 01/19/2021 0247   BUN 12 01/19/2021 0247   CREATININE 3.85 (H) 01/19/2021 0247   CREATININE 0.99 02/08/2017 1129   CALCIUM 7.0 (L) 01/19/2021 0247   GFRNONAA 14 (L) 01/19/2021 0247   GFRAA 8 (L) 09/12/2020 1905     ASSESSMENT: Patient needs long-term hemodialysis access.  She will also need temporary dialysis access with a tunneled dialysis catheter something functioning.  Her prescription is going to try one of her catheters tomorrow to see if they can get her dialyzed.  If successful I believe the first step in the process would be to remove her right tunneled dialysis catheter.  She would even need bilateral upper extremity central venogram to make sure that she does not have a outflow stenosis of her superior vena cava which has been previously angioplastied by interventional radiology.  She will also need bilateral upper extremity vein mapping.  Since she does not have a normal radial pulse she also needs bilateral upper extremity arterial duplex scan.  Since she may potentially need a thigh graft we will also order bilateral ABIs.  If her right femoral temporary dialysis catheter is not functional tomorrow most likely the best first choice for short-term dialysis would be to convert this to a tunneled dialysis catheter in the right femoral region so that we can fully examine her upper extremities to see what her central vein situation is.  We will follow up with her tomorrow after she is dialyzed to make further assessment.  Ruta Hinds, MD [facility] [place] Office: [phone]

## 2021-01-19 NOTE — Evaluation (Signed)
Occupational Therapy Evaluation Patient Details Name: Tamara [name] MRN: [id number] DOB: May 30, 1976 Today's Date: 01/19/2021    History of Present Illness Tamara [name]  is a 45 y.o. female, with past medical history of end-stage renal disease on hemodialysis, diminished mental capacity/developmental delay, history of hypertension, history of COVID with ARDS in February 2021, depression, anxiety, patient was sent in for evaluation of clogged hemodialysis catheter, patient had permacath in right subclavian area, and she was diagnosed with COVID, so her schedule has been altered for COVID dialysis days, patient herself cannot provide any history, history was obtained from ED staff, currently patient did not have a full dialysis session over 2 weeks, and apparently her dialysis catheter was clogged today during dialysis, so she could not be dialyzed, so she was not sent to ED for evaluation.  - in ED she is in no respiratory distress, no evidence of volume overload, count for potassium of 2.8, BUN of 22, and a hemoglobin of 6, she is Hemoccult negative, [facility] hospitalist consulted to admit.   Clinical Impression   Pt is a resident of SNF with baseline cognitive disability. She presents today with lethargy. She reports dependence in all ADL at her facility, but questionable historian. Pt requiring max assist to roll and demonstrates significant, generalized weakness. Limited assessment to bed level due to temporary femoral HD catheter. Will follow acutely.     Follow Up Recommendations  SNF    Equipment Recommendations  None recommended by OT    Recommendations for Other Services       Precautions / Restrictions Precautions Precautions: Fall;Other (comment) (COVID) Precaution Comments: pt with non-tunneled temporary femoral HD catheter      Mobility Bed Mobility Overal bed mobility: Needs Assistance Bed Mobility: Rolling Rolling: Max assist         General bed mobility comments: pt  with minimal intiation, did not attempt supine to sit due to temporary femoral HD cath    Transfers                 General transfer comment: deferred due to lethargy/temporary femoral HD cath    Balance                                           ADL either performed or assessed with clinical judgement   ADL                                         General ADL Comments: dependent     Vision Patient Visual Report: No change from baseline       Perception     Praxis      Pertinent Vitals/Pain Pain Assessment: Faces Faces Pain Scale: Hurts little more Pain Location: generalized Pain Descriptors / Indicators: Grimacing;Guarding;Moaning Pain Intervention(s): Repositioned     Hand Dominance Right   Extremity/Trunk Assessment Upper Extremity Assessment Upper Extremity Assessment: Generalized weakness;Difficult to assess due to impaired cognition   Lower Extremity Assessment Lower Extremity Assessment: Defer to PT evaluation   Cervical / Trunk Assessment Cervical / Trunk Assessment: Other exceptions (weakness)   Communication Communication Communication: No difficulties   Cognition Arousal/Alertness: Lethargic Behavior During Therapy: Flat affect Overall Cognitive Status: History of cognitive impairments - at baseline  General Comments       Exercises     Shoulder Instructions      Home Living Family/patient expects to be discharged to:: Skilled nursing facility                                        Prior Functioning/Environment Level of Independence: Needs assistance    ADL's / Homemaking Assistance Needed: pt reports she has assistance for all ADL at her facility            OT Problem List: Decreased strength;Decreased activity tolerance;Impaired balance (sitting and/or standing);Decreased cognition;Pain      OT Treatment/Interventions:  Self-care/ADL training;Therapeutic activities;Patient/family education;Balance training;Therapeutic exercise    OT Goals(Current goals can be found in the care plan section) Acute Rehab OT Goals Patient Stated Goal: Improve mobility to PLOF OT Goal Formulation: With patient Time For Goal Achievement: 02/02/21 Potential to Achieve Goals: Fair ADL Goals Pt/caregiver will Perform Home Exercise Program: Both right and left upper extremity;With minimal assist (A/AROM) Additional ADL Goal #1: Pt will perform bed mobility with moderate assistance for pericare and as a precursor to ADL. Additional ADL Goal #2: Pt will perform hand to mouth in preparation for self feeding. Additional ADL Goal #3: Pt will sit EOB x 10 minutes with min guard assist in preparation for ADL.  OT Frequency: Min 2X/week   Barriers to D/C:            Co-evaluation              AM-PAC OT "6 Clicks" Daily Activity     Outcome Measure Help from another person eating meals?: Total Help from another person taking care of personal grooming?: Total Help from another person toileting, which includes using toliet, bedpan, or urinal?: Total Help from another person bathing (including washing, rinsing, drying)?: Total Help from another person to put on and taking off regular upper body clothing?: Total Help from another person to put on and taking off regular lower body clothing?: Total 6 Click Score: 6   End of Session    Activity Tolerance: Patient limited by lethargy Patient left: in bed;with call bell/phone within reach;with bed alarm set  OT Visit Diagnosis: Pain;Muscle weakness (generalized) (M62.81);Other symptoms and signs involving cognitive function                Time: 1152-1205 OT Time Calculation (min): 13 min Charges:  OT General Charges $OT Visit: 1 Visit OT Evaluation $OT Eval Moderate Complexity: 1 Mod  [name], OTR/L Acute Rehabilitation Services Pager: [phone] Office:  [phone]  [name] 01/19/2021, 12:11 PM

## 2021-01-20 ENCOUNTER — Inpatient Hospital Stay (HOSPITAL_COMMUNITY): Payer: Medicare Other

## 2021-01-20 DIAGNOSIS — Z789 Other specified health status: Secondary | ICD-10-CM | POA: Diagnosis not present

## 2021-01-20 DIAGNOSIS — N186 End stage renal disease: Secondary | ICD-10-CM

## 2021-01-20 DIAGNOSIS — D649 Anemia, unspecified: Secondary | ICD-10-CM | POA: Diagnosis not present

## 2021-01-20 LAB — BPAM RBC
Blood Product Expiration Date: 202202112359
Blood Product Expiration Date: 202202132359
Blood Product Expiration Date: 202202162359
Blood Product Expiration Date: 202202172359
ISSUE DATE / TIME: 202201220113
ISSUE DATE / TIME: 202201220407
ISSUE DATE / TIME: 202201221504
Unit Type and Rh: 5100
Unit Type and Rh: 5100
Unit Type and Rh: 5100
Unit Type and Rh: 5100

## 2021-01-20 LAB — BASIC METABOLIC PANEL
Anion gap: 11 (ref 5–15)
BUN: 19 mg/dL (ref 6–20)
CO2: 25 mmol/L (ref 22–32)
Calcium: 7.1 mg/dL — ABNORMAL LOW (ref 8.9–10.3)
Chloride: 103 mmol/L (ref 98–111)
Creatinine, Ser: 4.68 mg/dL — ABNORMAL HIGH (ref 0.44–1.00)
GFR, Estimated: 11 mL/min — ABNORMAL LOW (ref >60)
Glucose, Bld: 86 mg/dL (ref 70–99)
Potassium: 2.7 mmol/L — CL (ref 3.5–5.1)
Sodium: 139 mmol/L (ref 135–145)

## 2021-01-20 LAB — TYPE AND SCREEN
ABO/RH(D): O POS
Antibody Screen: NEGATIVE
Unit division: 0
Unit division: 0
Unit division: 0
Unit division: 0

## 2021-01-20 LAB — CBC
HCT: 20.8 % — ABNORMAL LOW (ref 36.0–46.0)
Hemoglobin: 7.1 g/dL — ABNORMAL LOW (ref 12.0–15.0)
MCH: 27.4 pg (ref 26.0–34.0)
MCHC: 34.1 g/dL (ref 30.0–36.0)
MCV: 80.3 fL (ref 80.0–100.0)
Platelets: 52 10*3/uL — ABNORMAL LOW (ref 150–400)
RBC: 2.59 MIL/uL — ABNORMAL LOW (ref 3.87–5.11)
RDW: 18 % — ABNORMAL HIGH (ref 11.5–15.5)
WBC: 10.2 10*3/uL (ref 4.0–10.5)
nRBC: 0.3 % — ABNORMAL HIGH (ref 0.0–0.2)

## 2021-01-20 LAB — C-REACTIVE PROTEIN: CRP: 3 mg/dL — ABNORMAL HIGH (ref <1.0)

## 2021-01-20 LAB — CORTISOL-AM, BLOOD: Cortisol - AM: 17.6 ug/dL (ref 6.7–22.6)

## 2021-01-20 LAB — HEMOGLOBIN AND HEMATOCRIT, BLOOD
HCT: 26.2 % — ABNORMAL LOW (ref 36.0–46.0)
Hemoglobin: 9.4 g/dL — ABNORMAL LOW (ref 12.0–15.0)

## 2021-01-20 LAB — MAGNESIUM: Magnesium: 1.4 mg/dL — ABNORMAL LOW (ref 1.7–2.4)

## 2021-01-20 LAB — GLUCOSE, CAPILLARY
Glucose-Capillary: 84 mg/dL (ref 70–99)
Glucose-Capillary: 102 mg/dL — ABNORMAL HIGH (ref 70–99)

## 2021-01-20 LAB — T4, FREE: Free T4: 1.62 ng/dL — ABNORMAL HIGH (ref 0.61–1.12)

## 2021-01-20 LAB — FERRITIN: Ferritin: 1015 ng/mL — ABNORMAL HIGH (ref 11–307)

## 2021-01-20 LAB — PREPARE RBC (CROSSMATCH)

## 2021-01-20 MED ORDER — HEPARIN SODIUM (PORCINE) 1000 UNIT/ML IJ SOLN
INTRAMUSCULAR | Status: AC
  Filled 2021-01-20: qty 4

## 2021-01-20 MED ORDER — POTASSIUM CHLORIDE CRYS ER 20 MEQ PO TBCR
40.0000 meq | EXTENDED_RELEASE_TABLET | Freq: Once | ORAL | Status: AC
  Administered 2021-01-20: 40 meq via ORAL
  Filled 2021-01-20: qty 2

## 2021-01-20 MED ORDER — SODIUM CHLORIDE 0.9% IV SOLUTION: Freq: Once | INTRAVENOUS | Status: DC

## 2021-01-20 MED ORDER — MAGNESIUM OXIDE 400 (241.3 MG) MG PO TABS
800.0000 mg | ORAL_TABLET | ORAL | Status: AC
Start: 2021-01-20 — End: 2021-01-20
  Administered 2021-01-20 (×2): 800 mg via ORAL
  Filled 2021-01-20 (×2): qty 2

## 2021-01-20 MED ORDER — POTASSIUM CHLORIDE 10 MEQ/100ML IV SOLN
10.0000 meq | INTRAVENOUS | Status: AC
  Administered 2021-01-20 (×4): 10 meq via INTRAVENOUS
  Filled 2021-01-20: qty 100

## 2021-01-20 NOTE — Progress Notes (Signed)
PROGRESS NOTE    Tamara [name]  BJS:[id number] DOB: 11-May-1976 DOA: 01/23/2021 PCP: Hal Morales, DO   Brief Narrative:  45 year old with history of ESRD HD, developmental delay/diminished mental capacity, HTN, COVID-19 with ARDS February 2021, depression, anxiety admitted for malfunction of hemodialysis catheter initially at [facility] was unsuccessful therefore general surgery placed nontunneled catheter and patient was transferred to [facility] for vascular surgery evaluation.  Also incidental finding of COVID-19 infection, largely asymptomatic.  Currently nephrology and vascular team working in conjunction to help with her vascular/HD access.   Assessment & Plan:   Active Problems:   ESRD (end stage renal disease) (HCC)   Symptomatic anemia   Problem with vascular access   Symptomatic anemia, chronic anemia -Hemoglobin drifted down again.  1 more unit PRBC transfusion subcu today.  No obvious evidence of active bleeding.  ESRD on HD Malfunctioning HD access -Failed TPA, nontunneled catheter placed by general surgery at any [facility].  Transferred to [facility] for vascular evaluation.  Nephrology following.  Vascular team consulted.  Hypoglycemia -From poor oral intake.  Continue to monitor.  Encourage oral diet.  Getting amp of D50 if necessary.  Hypokalemia/hypomagnesemia -Repletion ordered  Essential hypertension -Currently not getting anything.  Supportive care.  Gets midodrine during dialysis  COVID-19 infection, asymptomatic -Initially tested positive-01/06/2021.  As documented results. -Isolation discontinued -Continue supportive care.  Saturating well on room air.  Chronic pancreatitis -On Creon  Hypothyroidism -Not on any medications at home?.  TSH elevated, check free T4  DVT prophylaxis: SCDs Code Status: Full Family Communication: Spoke with patient's legal guardian-director at her site who confirmed patient was positive  for COVID-19 on 01/06/2021.  They are going to fax over the results. Status is: Inpatient  Remains inpatient appropriate because:Inpatient level of care appropriate due to severity of illness   Dispo: The patient is from: ALF              Anticipated d/c is to: ALF              Anticipated d/c date is: 2 days              Patient currently is not medically stable to d/c.  Maintain hospital stay until nephrology and vascular surgery as determined safe HD access for her   Difficult to place patient No       Body mass index is 25.68 kg/m.   Subjective: Patient is resting in her bed reporting a mild headache.  No other complaints at the moment.  Review of Systems Otherwise negative except as per HPI, including: General: Denies fever, chills, night sweats or unintended weight loss. Resp: Denies cough, wheezing, shortness of breath. Cardiac: Denies chest pain, palpitations, orthopnea, paroxysmal nocturnal dyspnea. GI: Denies abdominal pain, nausea, vomiting, diarrhea or constipation GU: Denies dysuria, frequency, hesitancy or incontinence MS: Denies muscle aches, joint pain or swelling Neuro: Denies headache, neurologic deficits (focal weakness, numbness, tingling), abnormal gait Psych: Denies anxiety, depression, SI/HI/AVH Skin: Denies new rashes or lesions ID: Denies sick contacts, exotic exposures, travel Examination:  Constitutional: Not in acute distress Respiratory: Clear to auscultation bilaterally Cardiovascular: Normal sinus rhythm, no rubs Abdomen: Nontender nondistended good bowel sounds Musculoskeletal: No edema noted Skin: Right upper extremity ecchymosis noted extending from her arm to forearm.  No evidence of hemodynamic compromise. Neurologic: CN 2-12 grossly intact.  And nonfocal Psychiatric: Poor judgment and insight Right groin nontunneled HD catheter.  Objective: Vitals:   01/19/21 0500  01/19/21 2038 01/20/21 0459 01/20/21 0850  BP: 108/70 102/78 117/72  110/71  Pulse: 88 89 95 98  Resp: 16 16 18 16   Temp: 98.7 F (37.1 C) 97.7 F (36.5 C) 98.1 F (36.7 C) 97.7 F (36.5 C)  TempSrc: Oral Oral Axillary Oral  SpO2: 100% 100% 100% 92%  Weight:      Height:        Intake/Output Summary (Last 24 hours) at 01/20/2021 1134 Last data filed at 01/20/2021 0511 Gross per 24 hour  Intake 245 ml  Output --  Net 245 ml   Filed Weights   12/29/2020 1222  Weight: 70 kg     Data Reviewed:   CBC: Recent Labs  Lab 01/19/2021 1732 01/17/21 0500 01/17/21 1327 01/18/21 0738 01/20/21 0338  WBC 8.2 8.2 9.1 12.1* 10.2  NEUTROABS 4.0  --  7.2  --   --   HGB 6.0* 5.9* 6.3* 9.8* 7.1*  HCT 18.0* 18.3* 18.9* 28.2* 20.8*  MCV 84.1 85.5 82.9 81.3 80.3  PLT 134* 114* 121* 120* 52*   Basic Metabolic Panel: Recent Labs  Lab 01/07/2021 1631 01/17/21 0500 01/18/21 0738 01/19/21 0247 01/20/21 0338  NA 133* 139 136 135 139  K 2.8* 3.1* 3.0* 3.0* 2.7*  CL 103 107 98 100 103  CO2 20* 18* 22 23 25   GLUCOSE 77 78 61* 60* 86  BUN 22* 25* 12 12 19   CREATININE 5.57* 5.90* 3.43* 3.85* 4.68*  CALCIUM 7.3* 7.6* 7.5* 7.0* 7.1*  MG  --   --   --   --  1.4*  PHOS  --   --  1.9* 2.2*  --    GFR: Estimated Creatinine Clearance: 15.2 mL/min (A) (by C-G formula based on SCr of 4.68 mg/dL (H)). Liver Function Tests: Recent Labs  Lab 01/18/21 0738 01/19/21 0247  ALBUMIN 2.3* 1.8*   No results for input(s): LIPASE, AMYLASE in the last 168 hours. No results for input(s): AMMONIA in the last 168 hours. Coagulation Profile: No results for input(s): INR, PROTIME in the last 168 hours. Cardiac Enzymes: No results for input(s): CKTOTAL, CKMB, CKMBINDEX, TROPONINI in the last 168 hours. BNP (last 3 results) No results for input(s): PROBNP in the last 8760 hours. HbA1C: No results for input(s): HGBA1C in the last 72 hours. CBG: Recent Labs  Lab 01/18/21 2108 01/19/21 0811 01/19/21 0858 01/19/21 1146 01/19/21 1649  GLUCAP 58* 53* 64* 74 82   Lipid  Profile: No results for input(s): CHOL, HDL, LDLCALC, TRIG, CHOLHDL, LDLDIRECT in the last 72 hours. Thyroid Function Tests: Recent Labs    01/19/21 1207 01/20/21 0908  TSH 15.227*  --   FREET4  --  1.62*   Anemia Panel: Recent Labs    01/20/21 0338  FERRITIN 1,015*   Sepsis Labs: No results for input(s): PROCALCITON, LATICACIDVEN in the last 168 hours.  Recent Results (from the past 240 hour(s))  SARS Coronavirus 2 by RT PCR (hospital order, performed in [facility] hospital lab) Nasopharyngeal Nasopharyngeal Swab     Status: Abnormal   Collection Time: 01/02/2021  8:29 PM   Specimen: Nasopharyngeal Swab  Result Value Ref Range Status   SARS Coronavirus 2 POSITIVE (A) NEGATIVE Final    Comment: RESULT CALLED TO, READ BACK BY AND VERIFIED WITH: C. TURNER 1/21 @ 2232 BY S. BEARD. (NOTE) SARS-CoV-2 target nucleic acids are DETECTED  SARS-CoV-2 RNA is generally detectable in upper respiratory specimens  during the acute phase of infection.  Positive results are indicative  of the presence of the identified virus, but do not rule out bacterial infection or co-infection with other pathogens not detected by the test.  Clinical correlation with patient history and  other diagnostic information is necessary to determine patient infection status.  The expected result is negative.  Fact Sheet for Patients:   StrictlyIdeas.no   Fact Sheet for Healthcare Providers:   BankingDealers.co.za    This test is not yet approved or cleared by the [place] FDA and  has been authorized for detection and/or diagnosis of SARS-CoV-2 by FDA under an Emergency Use Authorization (EUA).  This EUA will remain in effect (meaning this te st can be used) for the duration of  the COVID-19 declaration under Section 564(b)(1) of the Act, 21 U.S.C. section 360-bbb-3(b)(1), unless the authorization is terminated or revoked sooner.  Performed at [facility], [address], [place], [place] [address]          Radiology Studies: No results found.      Scheduled Meds: . sodium chloride   Intravenous Once  . sodium chloride   Intravenous Once  . ARIPiprazole  5 mg Oral Daily  . Chlorhexidine Gluconate Cloth  6 each Topical Q0600  . dextrose  25 g Intravenous Once  . hydrOXYzine  25 mg Oral BID  . lipase/protease/amylase  36,000 Units Oral TID  . magnesium oxide  800 mg Oral Q4H  . midodrine  10 mg Oral Once per day on Mon Wed Fri  . pantoprazole  40 mg Oral Daily  . sertraline  100 mg Oral Daily   Continuous Infusions: . sodium chloride    . sodium chloride    . potassium chloride 10 mEq (01/20/21 1056)     LOS: 2 days   Time spent= 35 mins    Chrissa Meetze Arsenio Loader, MD [facility]  If 7PM-7AM, please contact night-coverage  01/20/2021, 11:34 AM

## 2021-01-20 NOTE — Progress Notes (Signed)
Bilateral upper extremity arterial duplex, bilateral upper extremity vein mapping, and ABI's have been completed. Preliminary results can be found in CV Proc through chart review.   01/20/21 2:03 PM Tamara [name] RVT

## 2021-01-20 NOTE — Progress Notes (Signed)
CRITICAL VALUE ALERT  Critical Value:  Potassium 2.7  Date & Time Notied:  01/20/21 0446  Provider Notified: Elder Cyphers, MD  Orders Received/Actions taken: See new orders

## 2021-01-20 NOTE — Progress Notes (Signed)
[facility] Progress Note   Subjective:   Pt seen in room, sleeping. States "I'm fine," then goes back to sleep. Noted K+ 2.7 this AM, given PO KCl 10mEq  Objective Vitals:   01/19/21 0500 01/19/21 2038 01/20/21 0459 01/20/21 0850  BP: 108/70 102/78 117/72 110/71  Pulse: 88 89 95 98  Resp: 16 16 18 16   Temp: 98.7 F (37.1 C) 97.7 F (36.5 C) 98.1 F (36.7 C) 97.7 F (36.5 C)  TempSrc: Oral Oral Axillary Oral  SpO2: 100% 100% 100% 92%  Weight:      Height:       Physical Exam General: WDWN female in NAD Heart: RRR, no murmurs, rubs or gallops Lungs: CTA anteriorly, no wheezing, rhonchi or rales Abdomen: Soft, non-tender, non-distended, +BS Extremities: No edema b/l LE extremities Dialysis Access: R IJ TDC, R femoral non-tunneled catheter  Additional Objective Labs: Basic Metabolic Panel: Recent Labs  Lab 01/18/21 0738 01/19/21 0247 01/20/21 0338  NA 136 135 139  K 3.0* 3.0* 2.7*  CL 98 100 103  CO2 22 23 25   GLUCOSE 61* 60* 86  BUN 12 12 19   CREATININE 3.43* 3.85* 4.68*  CALCIUM 7.5* 7.0* 7.1*  PHOS 1.9* 2.2*  --    Liver Function Tests: Recent Labs  Lab 01/18/21 0738 01/19/21 0247  ALBUMIN 2.3* 1.8*   No results for input(s): LIPASE, AMYLASE in the last 168 hours. CBC: Recent Labs  Lab 12/30/2020 1732 01/17/21 0500 01/17/21 1327 01/18/21 0738 01/20/21 0338  WBC 8.2 8.2 9.1 12.1* 10.2  NEUTROABS 4.0  --  7.2  --   --   HGB 6.0* 5.9* 6.3* 9.8* 7.1*  HCT 18.0* 18.3* 18.9* 28.2* 20.8*  MCV 84.1 85.5 82.9 81.3 80.3  PLT 134* 114* 121* 120* 52*   Blood Culture    Component Value Date/Time   SDES URINE, RANDOM 09/05/2020 1855   SPECREQUEST  09/05/2020 1855    NONE Performed at [facility] [address] [place], [place] [address]    CULT MULTIPLE SPECIES PRESENT, SUGGEST RECOLLECTION (A) 09/05/2020 1855   REPTSTATUS 09/07/2020 FINAL 09/05/2020 1855   CBG: Recent Labs  Lab 01/18/21 2108 01/19/21 0811 01/19/21 0858  01/19/21 1146 01/19/21 1649  GLUCAP 58* 53* 64* 74 82   Iron Studies:  Recent Labs    01/20/21 0338  FERRITIN 1,015*   Medications: . sodium chloride    . sodium chloride    . potassium chloride 10 mEq (01/20/21 1056)   . sodium chloride   Intravenous Once  . sodium chloride   Intravenous Once  . ARIPiprazole  5 mg Oral Daily  . Chlorhexidine Gluconate Cloth  6 each Topical Q0600  . dextrose  25 g Intravenous Once  . hydrOXYzine  25 mg Oral BID  . lipase/protease/amylase  36,000 Units Oral TID  . magnesium oxide  800 mg Oral Q4H  . midodrine  10 mg Oral Once per day on Mon Wed Fri  . pantoprazole  40 mg Oral Daily  . sertraline  100 mg Oral Daily    Outpatient Dialysis: [facility]  Assessment/Plan: # Malfunctioning HD catheter   - IR recommended VVS consult. Seen by Dr. Oneida Alar 01/19/21 who recommends upper extremity central venogram and UE vein mapping, possibly converting femoral catheter to tunneled. VVS planning to reeval after HD today. Appreciate their assistance.  - has functioning R fem temp cath in place right now - has still in a R IJ TDC, not sure if functional  or not   # ESRD on HD TTS - last HD was on Sat 1/22, labs here are good - HD today  # Covid 19 -off covid precautions per [facility] at this time.   # Anemia - CKD and possible acute component  - Improved with PRBC's (last given through HD catheter with HD on 1/22), then dropped from 9.8 to 7.1 overnight.  - primary team working up   # Developmental Delay - Note APS provided consent for HD  # Hypokalemia - Received potassium 59mEq today, using 4K bath with dialysis.   # Hypophos  - gently repleted  Anice Paganini, PA-C 01/20/2021, 11:06 AM  [facility] Pager: [phone]

## 2021-01-20 NOTE — Progress Notes (Signed)
Physical Therapy Treatment Patient Details Name: Tamara [name] MRN: [id number] DOB: 03-03-1976 Today's Date: 01/20/2021    History of Present Illness Pt is a 45 y.o. SNF resident admitted 01/05/2021 with clogged R subclavian HD catheter; pt incidentally tested (+) COVID-19. S/p placement of temporary R femoral catheter 1/22. PMH includes ESRD (on HD), developmental delay, HTN, COVID ARDS (01/2020), depression, axniety.   PT Comments    Pt much more alert and interactive this session. Seen for bed-level mobility progression; deferred seated EOB and standing activity secondary to presence of non-tunneled femoral HD cath. Pt moving well at bed-level with intermittent minA. Pt pleasant and cooperative, although quick to fatigue. Continue to recommend return to SNF; pt reports she uses w/c at baseline with assist from staff for transfers.    Follow Up Recommendations  SNF;Supervision for mobility/OOB     Equipment Recommendations   (defer)    Recommendations for Other Services       Precautions / Restrictions Precautions Precautions: Fall;Other (comment) Precaution Comments: Temporary (non-tunneled) R femoral HD catheter    Mobility  Bed Mobility Overal bed mobility: Needs Assistance Bed Mobility: Rolling Rolling: Supervision         General bed mobility comments: Multiple bouts of bed mobility, rolling R/L multiple times with use of bed rail and supervision with cues to prevent significant hip flexion; pt able to bridge hips (with assist) and scoot up/down in bed with minA and use of BUE support on bed rails  Transfers                 General transfer comment: Deferred secondary to temporary femoral HD cath  Ambulation/Gait                 Stairs             Wheelchair Mobility    Modified Rankin (Stroke Patients Only)       Balance                                            Cognition Arousal/Alertness: Awake/alert Behavior  During Therapy: Flat affect Overall Cognitive Status: History of cognitive impairments - at baseline                                 General Comments: Pt much more alert and interactive, following majority of simple commands directly; difficulty problem solving and poor historian. oriented to self      Exercises      General Comments        Pertinent Vitals/Pain Pain Assessment: Faces Faces Pain Scale: Hurts a little bit Pain Location: RUE Pain Descriptors / Indicators: Discomfort Pain Intervention(s): Monitored during session    Home Living                      Prior Function            PT Goals (current goals can now be found in the care plan section) Progress towards PT goals: Progressing toward goals    Frequency    Min 2X/week      PT Plan Frequency needs to be updated    Co-evaluation              AM-PAC PT "6 Clicks" Mobility   Outcome Measure  Help  needed turning from your back to your side while in a flat bed without using bedrails?: A Little Help needed moving from lying on your back to sitting on the side of a flat bed without using bedrails?: A Little Help needed moving to and from a bed to a chair (including a wheelchair)?: A Lot Help needed standing up from a chair using your arms (e.g., wheelchair or bedside chair)?: A Lot Help needed to walk in hospital room?: A Lot Help needed climbing 3-5 steps with a railing? : Total 6 Click Score: 13    End of Session   Activity Tolerance: Patient tolerated treatment well Patient left: in bed;with call bell/phone within reach;with bed alarm set Nurse Communication: Mobility status PT Visit Diagnosis: Unsteadiness on feet (R26.81);Muscle weakness (generalized) (M62.81);Difficulty in walking, not elsewhere classified (R26.2)     Time: 6767-2094 PT Time Calculation (min) (ACUTE ONLY): 15 min  Charges:  $Therapeutic Activity: 8-22 mins                    Mabeline Caras, PT,  DPT Acute Rehabilitation Services  Pager [phone] Office [place] 01/20/2021, 5:37 PM

## 2021-01-21 DIAGNOSIS — D649 Anemia, unspecified: Secondary | ICD-10-CM | POA: Diagnosis not present

## 2021-01-21 LAB — CBC
HCT: 25.8 % — ABNORMAL LOW (ref 36.0–46.0)
Hemoglobin: 9.2 g/dL — ABNORMAL LOW (ref 12.0–15.0)
MCH: 28.2 pg (ref 26.0–34.0)
MCHC: 35.7 g/dL (ref 30.0–36.0)
MCV: 79.1 fL — ABNORMAL LOW (ref 80.0–100.0)
Platelets: 39 10*3/uL — ABNORMAL LOW (ref 150–400)
RBC: 3.26 MIL/uL — ABNORMAL LOW (ref 3.87–5.11)
RDW: 17 % — ABNORMAL HIGH (ref 11.5–15.5)
WBC: 11.9 10*3/uL — ABNORMAL HIGH (ref 4.0–10.5)
nRBC: 0 % (ref 0.0–0.2)

## 2021-01-21 LAB — HEPATITIS B SURFACE ANTIGEN: Hepatitis B Surface Ag: NONREACTIVE

## 2021-01-21 LAB — BASIC METABOLIC PANEL
Anion gap: 11 (ref 5–15)
BUN: 13 mg/dL (ref 6–20)
CO2: 23 mmol/L (ref 22–32)
Calcium: 6.9 mg/dL — ABNORMAL LOW (ref 8.9–10.3)
Chloride: 104 mmol/L (ref 98–111)
Creatinine, Ser: 3.14 mg/dL — ABNORMAL HIGH (ref 0.44–1.00)
GFR, Estimated: 18 mL/min — ABNORMAL LOW (ref >60)
Glucose, Bld: 75 mg/dL (ref 70–99)
Potassium: 3.9 mmol/L (ref 3.5–5.1)
Sodium: 138 mmol/L (ref 135–145)

## 2021-01-21 LAB — GLUCOSE, CAPILLARY
Glucose-Capillary: 70 mg/dL (ref 70–99)
Glucose-Capillary: 87 mg/dL (ref 70–99)
Glucose-Capillary: 75 mg/dL (ref 70–99)
Glucose-Capillary: 81 mg/dL (ref 70–99)
Glucose-Capillary: 82 mg/dL (ref 70–99)
Glucose-Capillary: 83 mg/dL (ref 70–99)

## 2021-01-21 LAB — MAGNESIUM: Magnesium: 1.5 mg/dL — ABNORMAL LOW (ref 1.7–2.4)

## 2021-01-21 LAB — FERRITIN: Ferritin: 1277 ng/mL — ABNORMAL HIGH (ref 11–307)

## 2021-01-21 LAB — C-REACTIVE PROTEIN: CRP: 2.1 mg/dL — ABNORMAL HIGH (ref <1.0)

## 2021-01-21 MED ORDER — CHLORHEXIDINE GLUCONATE CLOTH 2 % EX PADS
6.0000 | MEDICATED_PAD | Freq: Every day | CUTANEOUS | Status: DC
  Administered 2021-01-21 – 2021-01-26 (×7): 6 via TOPICAL

## 2021-01-21 MED ORDER — DARBEPOETIN ALFA 150 MCG/0.3ML IJ SOSY: 150.0000 ug | PREFILLED_SYRINGE | INTRAMUSCULAR | Status: DC

## 2021-01-21 MED ORDER — HYDROCERIN EX CREA
TOPICAL_CREAM | Freq: Two times a day (BID) | CUTANEOUS | Status: DC
  Administered 2021-01-21 – 2021-01-27 (×2): 1 via TOPICAL
  Filled 2021-01-21 (×2): qty 113

## 2021-01-21 MED ORDER — MAGNESIUM SULFATE 2 GM/50ML IV SOLN
2.0000 g | Freq: Once | INTRAVENOUS | Status: AC
  Administered 2021-01-21: 2 g via INTRAVENOUS
  Filled 2021-01-21: qty 50

## 2021-01-21 NOTE — Progress Notes (Signed)
Tamara [name]  QMG:[id number] DOB: 09/17/1976 DOA: 12/29/2020 PCP: Hal Morales, DO   Brief Narrative:  45 year old with history of ESRD HD, developmental delay/diminished mental capacity, HTN, COVID-19 with ARDS February 2021, depression, anxiety admitted for malfunction of hemodialysis catheter initially at [place] was unsuccessful therefore general surgery placed nontunneled catheter and patient was transferred to [facility] for vascular surgery evaluation.  Also incidental finding of COVID-19 infection, largely asymptomatic.  Currently nephrology and vascular team working in conjunction to help with her vascular/HD access.   Assessment & Plan: Symptomatic anemia, chronic anemia Likely anemia of chronic kidney disease. SP PRBC transfusion. H&H relatively stable. No active bleeding. Patient definitely has bilateral significant erythema which I think is subcutaneous hemorrhage. At risk for further worsening.  Severe thrombocytopenia. Likely secondary to dilution. We will monitor for now in coronation further work-up. Patient the patient at high risk for poor outcome as well as bleeding while she is going through vascular procedure.  ESRD on HD Malfunctioning HD access -Failed TPA, nontunneled catheter placed by general surgery at any [facility].  Transferred to [facility] for vascular evaluation.  Nephrology following.  Vascular team consulted.  Hypoglycemia -From poor oral intake.  Continue to monitor.  Encourage oral diet.  Getting amp of D50 if necessary.  Hypokalemia/hypomagnesemia -Repletion ordered  Essential hypertension -Currently not getting anything.  Supportive care.  Gets midodrine during dialysis  COVID-19 infection, asymptomatic -Initially tested positive-01/06/2021.  As documented results. -Isolation discontinued -Continue supportive care.  Saturating well on room air.  Chronic pancreatitis -On  Creon  Hypothyroidism -Not on any medications at home?.  TSH elevated, check free T4  DVT prophylaxis: SCDs Code Status: Full Family Communication: No family at bedside. Status is: Inpatient  Remains inpatient appropriate because:Inpatient level of care appropriate due to severity of illness   Dispo: The patient is from: ALF              Anticipated d/c is to: ALF              Anticipated d/c date is: 2 days              Patient currently is not medically stable to d/c.  Maintain hospital stay until nephrology and vascular surgery as determined safe HD access for her   Difficult to place patient No       Body mass index is 24.36 kg/m.   Subjective: Reports pain.  No nausea no vomiting but no fever no chills.  No chest pain.  No abdominal pain.  Physical exam. General: Appear in mild distress, no Rash; Oral Mucosa Clear, moist. no Abnormal Neck Mass Or lumps, Conjunctiva normal  Cardiovascular: S1 and S2 Present, no Murmur, Respiratory: increased respiratory effort, Bilateral Air entry present and bilateral  Crackles, no wheezes Abdomen: Bowel Sound present, Soft and no tenderness Extremities: trace Pedal edema Neurology: alert and oriented to time, place, and person affect appropriate. no new focal deficit Gait not checked due to patient safety concerns   Skin: Right upper extremity ecchymosis noted extending from her arm to forearm.  Pulses palpable. Right groin nontunneled HD catheter.  Objective: Vitals:   01/21/21 0158 01/21/21 0209 01/21/21 0451 01/21/21 0900  BP: (!) 98/56 (!) 111/53 95/64 112/71  Pulse: 98 97 100 (!) 110  Resp:  18 18 18   Temp: 97.7 F (36.5 C) 97.8 F (36.6 C) 98.2 F (36.8 C) 98.1 F (36.7 C)  TempSrc:  Oral Oral Axillary Oral  SpO2:  100% 100% 100%  Weight:      Height:        Intake/Output Summary (Last 24 hours) at 01/21/2021 1951 Last data filed at 01/21/2021 1305 Gross per 24 hour  Intake 120 ml  Output 500 ml  Net -380 ml    Filed Weights   01/12/2021 1222 01/20/21 2300 01/21/21 0145  Weight: 70 kg 66.9 kg 66.4 kg     Data Reviewed:   CBC: Recent Labs  Lab 12/31/2020 1732 01/17/21 0500 01/17/21 1327 01/18/21 0738 01/20/21 0338 01/20/21 1925 01/21/21 0400  WBC 8.2 8.2 9.1 12.1* 10.2  --  11.9*  NEUTROABS 4.0  --  7.2  --   --   --   --   HGB 6.0* 5.9* 6.3* 9.8* 7.1* 9.4* 9.2*  HCT 18.0* 18.3* 18.9* 28.2* 20.8* 26.2* 25.8*  MCV 84.1 85.5 82.9 81.3 80.3  --  79.1*  PLT 134* 114* 121* 120* 52*  --  39*   Basic Metabolic Panel: Recent Labs  Lab 01/17/21 0500 01/18/21 0738 01/19/21 0247 01/20/21 0338 01/21/21 0400  NA 139 136 135 139 138  K 3.1* 3.0* 3.0* 2.7* 3.9  CL 107 98 100 103 104  CO2 18* 22 23 25 23   GLUCOSE 78 61* 60* 86 75  BUN 25* 12 12 19 13   CREATININE 5.90* 3.43* 3.85* 4.68* 3.14*  CALCIUM 7.6* 7.5* 7.0* 7.1* 6.9*  MG  --   --   --  1.4* 1.5*  PHOS  --  1.9* 2.2*  --   --    GFR: Estimated Creatinine Clearance: 20.8 mL/min (A) (by C-G formula based on SCr of 3.14 mg/dL (H)). Liver Function Tests: Recent Labs  Lab 01/18/21 0738 01/19/21 0247  ALBUMIN 2.3* 1.8*   No results for input(s): LIPASE, AMYLASE in the last 168 hours. No results for input(s): AMMONIA in the last 168 hours. Coagulation Profile: No results for input(s): INR, PROTIME in the last 168 hours. Cardiac Enzymes: No results for input(s): CKTOTAL, CKMB, CKMBINDEX, TROPONINI in the last 168 hours. BNP (last 3 results) No results for input(s): PROBNP in the last 8760 hours. HbA1C: No results for input(s): HGBA1C in the last 72 hours. CBG: Recent Labs  Lab 01/21/21 0731 01/21/21 0805 01/21/21 1148 01/21/21 1600 01/21/21 1659  GLUCAP 70 87 75 81 82   Lipid Profile: No results for input(s): CHOL, HDL, LDLCALC, TRIG, CHOLHDL, LDLDIRECT in the last 72 hours. Thyroid Function Tests: Recent Labs    01/19/21 1207 01/20/21 0908  TSH 15.227*  --   FREET4  --  1.62*   Anemia Panel: Recent Labs     01/20/21 0338 01/21/21 0400  FERRITIN 1,015* 1,277*   Sepsis Labs: No results for input(s): PROCALCITON, LATICACIDVEN in the last 168 hours.  Recent Results (from the past 240 hour(s))  SARS Coronavirus 2 by RT PCR (hospital order, performed in [facility] hospital lab) Nasopharyngeal Nasopharyngeal Swab     Status: Abnormal   Collection Time: 01/14/2021  8:29 PM   Specimen: Nasopharyngeal Swab  Result Value Ref Range Status   SARS Coronavirus 2 POSITIVE (A) NEGATIVE Final    Comment: RESULT CALLED TO, READ BACK BY AND VERIFIED WITH: C. TURNER 1/21 @ 2232 BY S. BEARD. (NOTE) SARS-CoV-2 target nucleic acids are DETECTED  SARS-CoV-2 RNA is generally detectable in upper respiratory specimens  during the acute phase of infection.  Positive results are indicative  of the presence of the identified virus,  but do not rule out bacterial infection or co-infection with other pathogens not detected by the test.  Clinical correlation with patient history and  other diagnostic information is necessary to determine patient infection status.  The expected result is negative.  Fact Sheet for Patients:   StrictlyIdeas.no   Fact Sheet for Healthcare Providers:   BankingDealers.co.za    This test is not yet approved or cleared by the [place] FDA and  has been authorized for detection and/or diagnosis of SARS-CoV-2 by FDA under an Emergency Use Authorization (EUA).  This EUA will remain in effect (meaning this te st can be used) for the duration of  the COVID-19 declaration under Section 564(b)(1) of the Act, 21 U.S.C. section 360-bbb-3(b)(1), unless the authorization is terminated or revoked sooner.  Performed at [facility], [address], [place], [place] [address]          Radiology Studies: VAS [place] ABI WITH/WO TBI  Result Date: 01/20/2021 LOWER EXTREMITY DOPPLER STUDY Indications: AVF creation. High Risk Factors: Hypertension.   Limitations: Today's exam was limited due to patient positioning, patient unable              to lie flat, patient intolerant to cuff pressure, involuntary              patient movement, bandages and poor patient cooperation. Comparison Study: No prior studies. Performing Technologist: Carlos Levering RVT  Examination Guidelines: A complete evaluation includes at minimum, Doppler waveform signals and systolic blood pressure reading at the level of bilateral brachial, anterior tibial, and posterior tibial arteries, when vessel segments are accessible. Bilateral testing is considered an integral part of a complete examination. Photoelectric Plethysmograph (PPG) waveforms and toe systolic pressure readings are included as required and additional duplex testing as needed. Limited examinations for reoccurring indications may be performed as noted.  ABI Findings: +--------+------------------+-----+---------+------------------+ Right   Rt Pressure (mmHg)IndexWaveform Comment            +--------+------------------+-----+---------+------------------+ [id number]                    triphasic                   +--------+------------------+-----+---------+------------------+ PTA                                     Unable to insonate +--------+------------------+-----+---------+------------------+ DP                             absent                      +--------+------------------+-----+---------+------------------+ +--------+------------------+-----+--------+------------------+ Left    Lt Pressure (mmHg)IndexWaveformComment            +--------+------------------+-----+--------+------------------+ Brachial                               Unable to insonate +--------+------------------+-----+--------+------------------+ PTA     105               0.93                            +--------+------------------+-----+--------+------------------+ DP      112               0.99                             +--------+------------------+-----+--------+------------------+ +-------+-----------+-----------+------------+------------+  ABI/TBIToday's ABIToday's TBIPrevious ABIPrevious TBI +-------+-----------+-----------+------------+------------+ Left   0.99                                           +-------+-----------+-----------+------------+------------+  Summary: Right: Unable to obtain ABI due to patient positioning, and poor patient cooperation. Left: Resting left ankle-brachial index is within normal range. No evidence of significant left lower extremity arterial disease.  *See table(s) above for measurements and observations.  Electronically signed by Deitra Mayo MD on 01/20/2021 at 4:11:22 PM.    Final    VAS [place] UPPER EXTREMITY ARTERIAL DUPLEX  Result Date: 01/20/2021 UPPER EXTREMITY DUPLEX STUDY Indications: Patient complains of AVF creation.  Risk Factors: Hypertension. Limitations: Patient positioning, poor patient cooperation, patient movement,              patient pain tolerance Comparison Study: 09/05/2020 Performing Technologist: Oliver Hum RVT  Examination Guidelines: A complete evaluation includes B-mode imaging, spectral Doppler, color Doppler, and power Doppler as needed of all accessible portions of each vessel. Bilateral testing is considered an integral part of a complete examination. Limited examinations for reoccurring indications may be performed as noted.  Right Doppler Findings: +---------------+----------+---------+--------+--------+ Site           PSV (cm/s)Waveform StenosisComments +---------------+----------+---------+--------+--------+ Subclavian Dist          triphasic                 +---------------+----------+---------+--------+--------+ Brachial Dist  74        triphasic        0.32cm   +---------------+----------+---------+--------+--------+ Radial Dist    47        triphasic        0.15cm    +---------------+----------+---------+--------+--------+ Ulnar Dist     31        triphasic        0.12cm   +---------------+----------+---------+--------+--------+   Left Doppler Findings: +-------------+----------+---------+--------+--------+ Site         PSV (cm/s)Waveform StenosisComments +-------------+----------+---------+--------+--------+ Brachial Dist89        triphasic        0.23cm   +-------------+----------+---------+--------+--------+ Radial Dist  47        triphasic        0.17     +-------------+----------+---------+--------+--------+ Ulnar Dist   28        biphasic         0.1cm    +-------------+----------+---------+--------+--------+   Electronically signed by Deitra Mayo MD on 01/20/2021 at 4:12:51 PM.    Final    VAS [place] UPPER EXT VEIN MAPPING (PRE-OP AVF)  Result Date: 01/20/2021 UPPER EXTREMITY VEIN MAPPING  Indications: Pre-access. Limitations: Patient positioning, poor patient cooperation, patient movement,              patient pain tolerance Comparison Study: No prior studies. Performing Technologist: Oliver Hum RVT  Examination Guidelines: A complete evaluation includes B-mode imaging, spectral Doppler, color Doppler, and power Doppler as needed of all accessible portions of each vessel. Bilateral testing is considered an integral part of a complete examination. Limited examinations for reoccurring indications may be performed as noted. +-----------------+-------------+----------+--------------+ Right Cephalic   Diameter (cm)Depth (cm)   Findings    +-----------------+-------------+----------+--------------+ Shoulder             0.22        1.20                  +-----------------+-------------+----------+--------------+  Prox upper arm       0.23        1.40                  +-----------------+-------------+----------+--------------+ Mid upper arm        0.24        1.60                   +-----------------+-------------+----------+--------------+ Dist upper arm       0.18        1.40                  +-----------------+-------------+----------+--------------+ Antecubital fossa    0.19        0.40                  +-----------------+-------------+----------+--------------+ Prox forearm         0.13        1.00                  +-----------------+-------------+----------+--------------+ Mid forearm          0.09        1.10                  +-----------------+-------------+----------+--------------+ Dist forearm                            not visualized +-----------------+-------------+----------+--------------+ +-----------------+-------------+----------+--------------+ Right Basilic    Diameter (cm)Depth (cm)   Findings    +-----------------+-------------+----------+--------------+ Shoulder             0.50        1.10                  +-----------------+-------------+----------+--------------+ Mid upper arm        0.43        0.98                  +-----------------+-------------+----------+--------------+ Dist upper arm       0.23        1.20     branching    +-----------------+-------------+----------+--------------+ Antecubital fossa    0.20        1.10     branching    +-----------------+-------------+----------+--------------+ Prox forearm         0.10        0.69                  +-----------------+-------------+----------+--------------+ Mid forearm          0.06        0.49                  +-----------------+-------------+----------+--------------+ Distal forearm                          not visualized +-----------------+-------------+----------+--------------+ +-----------------+-------------+----------+--------+ Left Cephalic    Diameter (cm)Depth (cm)Findings +-----------------+-------------+----------+--------+ Shoulder             0.17        1.30            +-----------------+-------------+----------+--------+  Prox upper arm       0.13        1.20            +-----------------+-------------+----------+--------+ Mid upper arm        0.10        1.40            +-----------------+-------------+----------+--------+  Dist upper arm       0.08        0.96            +-----------------+-------------+----------+--------+ Antecubital fossa    0.06        0.52            +-----------------+-------------+----------+--------+ Prox forearm         0.16        0.63            +-----------------+-------------+----------+--------+ Mid forearm          0.15        0.69            +-----------------+-------------+----------+--------+ Dist forearm         0.09        0.33            +-----------------+-------------+----------+--------+ +-----------------+-------------+----------+--------------+ Left Basilic     Diameter (cm)Depth (cm)   Findings    +-----------------+-------------+----------+--------------+ Shoulder             0.22        1.40                  +-----------------+-------------+----------+--------------+ Mid upper arm        0.22        1.30                  +-----------------+-------------+----------+--------------+ Dist upper arm       0.18        1.20                  +-----------------+-------------+----------+--------------+ Antecubital fossa    0.20        0.87     branching    +-----------------+-------------+----------+--------------+ Prox forearm         0.06        0.66                  +-----------------+-------------+----------+--------------+ Mid forearm                             not visualized +-----------------+-------------+----------+--------------+ Distal forearm                          not visualized +-----------------+-------------+----------+--------------+ *See table(s) above for measurements and observations.  Diagnosing physician: Deitra Mayo MD Electronically signed by Deitra Mayo MD on 01/20/2021 at 4:12:32  PM.    Final         Scheduled Meds: . sodium chloride   Intravenous Once  . ARIPiprazole  5 mg Oral Daily  . Chlorhexidine Gluconate Cloth  6 each Topical Q0600  . [START ON 01/22/2021] darbepoetin (ARANESP) injection - DIALYSIS  150 mcg Intravenous Q Thu-HD  . hydrocerin   Topical BID  . hydrOXYzine  25 mg Oral BID  . lipase/protease/amylase  36,000 Units Oral TID  . midodrine  10 mg Oral Once per day on Mon Wed Fri  . pantoprazole  40 mg Oral Daily  . sertraline  100 mg Oral Daily   Continuous Infusions: . sodium chloride    . sodium chloride       LOS: 3 days   Time spent= 35 mins    Berle Mull, MD [facility]  If 7PM-7AM, please contact night-coverage  01/21/2021, 7:51 PM

## 2021-01-21 NOTE — Progress Notes (Signed)
Called pt mother today to obtain informed consent.  Procedure will be bilateral central venogram removal of tunneled dialysis catheter and placement of new tunneled dialysis catheter.  Risks of bleeding and contrast reaction infection discussed.  I will contact pt nurse to confirm consent with mother.  Ruta Hinds, MD [facility] Office: [phone]

## 2021-01-21 NOTE — Progress Notes (Signed)
Pt returned to [id number] from Hemodialysis.

## 2021-01-21 NOTE — Consult Note (Signed)
Pt went to HD yesterday.  No record of which access they used.  Will tentatively plan to place tunneled catheter and do central venogram in OR on Friday if clinically stable.  Will need to track down who consents for her  Call if questions  Ruta Hinds, MD [facility] [place] Office: [phone]

## 2021-01-21 NOTE — Progress Notes (Addendum)
[facility] Progress Note   Subjective:   Pt had HD overnight using femoral catheter. Alert and smiling this AM, says she is feeling well. Denies SOB, CP, palpitations, dizziness, abdominal pain. VVS planning for central venogram and catheter replacement.   Objective Vitals:   01/21/21 0158 01/21/21 0209 01/21/21 0451 01/21/21 0900  BP: (!) 98/56 (!) 111/53 95/64 112/71  Pulse: 98 97 100 (!) 110  Resp:  18 18 18   Temp: 97.7 F (36.5 C) 97.8 F (36.6 C) 98.2 F (36.8 C) 98.1 F (36.7 C)  TempSrc: Oral Oral Axillary Oral  SpO2:  100% 100% 100%  Weight:      Height:       Physical Exam General: Well developed, alert female in NAD Heart: RRR, no murmurs, rubs or gallops Lungs: CTA bilaterally without wheezing, rhonchi or rales Abdomen: Soft, non-tender, non-distended, +BS Extremities: No edema b/l lower extremities Dialysis Access: R IJ TDC, R femoral non-tunneled catheter  Additional Objective Labs: Basic Metabolic Panel: Recent Labs  Lab 01/18/21 0738 01/19/21 0247 01/20/21 0338 01/21/21 0400  NA 136 135 139 138  K 3.0* 3.0* 2.7* 3.9  CL 98 100 103 104  CO2 22 23 25 23   GLUCOSE 61* 60* 86 75  BUN 12 12 19 13   CREATININE 3.43* 3.85* 4.68* 3.14*  CALCIUM 7.5* 7.0* 7.1* 6.9*  PHOS 1.9* 2.2*  --   --    Liver Function Tests: Recent Labs  Lab 01/18/21 0738 01/19/21 0247  ALBUMIN 2.3* 1.8*   CBC: Recent Labs  Lab 12/31/2020 1732 01/17/21 0500 01/17/21 1327 01/18/21 0738 01/20/21 0338 01/20/21 1925 01/21/21 0400  WBC 8.2 8.2 9.1 12.1* 10.2  --  11.9*  NEUTROABS 4.0  --  7.2  --   --   --   --   HGB 6.0* 5.9* 6.3* 9.8* 7.1* 9.4* 9.2*  HCT 18.0* 18.3* 18.9* 28.2* 20.8* 26.2* 25.8*  MCV 84.1 85.5 82.9 81.3 80.3  --  79.1*  PLT 134* 114* 121* 120* 52*  --  39*   Blood Culture    Component Value Date/Time   SDES URINE, RANDOM 09/05/2020 1855   SPECREQUEST  09/05/2020 1855    NONE Performed at [facility], [address]  [place], [place] [address]    CULT MULTIPLE SPECIES PRESENT, SUGGEST RECOLLECTION (A) 09/05/2020 1855   REPTSTATUS 09/07/2020 FINAL 09/05/2020 1855   CBG: Recent Labs  Lab 01/19/21 1649 01/20/21 1146 01/20/21 1652 01/21/21 0731 01/21/21 0805  GLUCAP 82 84 102* 70 87   Iron Studies:  Recent Labs    01/21/21 0400  FERRITIN 1,277*    Studies/Results: VAS [place] ABI WITH/WO TBI  Result Date: 01/20/2021 LOWER EXTREMITY DOPPLER STUDY Indications: AVF creation. High Risk Factors: Hypertension.  Limitations: Today's exam was limited due to patient positioning, patient unable              to lie flat, patient intolerant to cuff pressure, involuntary              patient movement, bandages and poor patient cooperation. Comparison Study: No prior studies. Performing Technologist: Carlos Levering RVT  Examination Guidelines: A complete evaluation includes at minimum, Doppler waveform signals and systolic blood pressure reading at the level of bilateral brachial, anterior tibial, and posterior tibial arteries, when vessel segments are accessible. Bilateral testing is considered an integral part of a complete examination. Photoelectric Plethysmograph (PPG) waveforms and toe systolic pressure readings are included as required and additional duplex testing as  needed. Limited examinations for reoccurring indications may be performed as noted.  ABI Findings: +--------+------------------+-----+---------+------------------+ Right   Rt Pressure (mmHg)IndexWaveform Comment            +--------+------------------+-----+---------+------------------+ [id number]                    triphasic                   +--------+------------------+-----+---------+------------------+ PTA                                     Unable to insonate +--------+------------------+-----+---------+------------------+ DP                             absent                       +--------+------------------+-----+---------+------------------+ +--------+------------------+-----+--------+------------------+ Left    Lt Pressure (mmHg)IndexWaveformComment            +--------+------------------+-----+--------+------------------+ Brachial                               Unable to insonate +--------+------------------+-----+--------+------------------+ PTA     105               0.93                            +--------+------------------+-----+--------+------------------+ DP      112               0.99                            +--------+------------------+-----+--------+------------------+ +-------+-----------+-----------+------------+------------+ ABI/TBIToday's ABIToday's TBIPrevious ABIPrevious TBI +-------+-----------+-----------+------------+------------+ Left   0.99                                           +-------+-----------+-----------+------------+------------+  Summary: Right: Unable to obtain ABI due to patient positioning, and poor patient cooperation. Left: Resting left ankle-brachial index is within normal range. No evidence of significant left lower extremity arterial disease.  *See table(s) above for measurements and observations.  Electronically signed by Deitra Mayo MD on 01/20/2021 at 4:11:22 PM.    Final    VAS [place] UPPER EXTREMITY ARTERIAL DUPLEX  Result Date: 01/20/2021 UPPER EXTREMITY DUPLEX STUDY Indications: Patient complains of AVF creation.  Risk Factors: Hypertension. Limitations: Patient positioning, poor patient cooperation, patient movement,              patient pain tolerance Comparison Study: 09/05/2020 Performing Technologist: Oliver Hum RVT  Examination Guidelines: A complete evaluation includes B-mode imaging, spectral Doppler, color Doppler, and power Doppler as needed of all accessible portions of each vessel. Bilateral testing is considered an integral part of a complete examination. Limited examinations for  reoccurring indications may be performed as noted.  Right Doppler Findings: +---------------+----------+---------+--------+--------+ Site           PSV (cm/s)Waveform StenosisComments +---------------+----------+---------+--------+--------+ Subclavian Dist          triphasic                 +---------------+----------+---------+--------+--------+ Brachial Dist  74        triphasic  0.32cm   +---------------+----------+---------+--------+--------+ Radial Dist    47        triphasic        0.15cm   +---------------+----------+---------+--------+--------+ Ulnar Dist     31        triphasic        0.12cm   +---------------+----------+---------+--------+--------+   Left Doppler Findings: +-------------+----------+---------+--------+--------+ Site         PSV (cm/s)Waveform StenosisComments +-------------+----------+---------+--------+--------+ Brachial Dist89        triphasic        0.23cm   +-------------+----------+---------+--------+--------+ Radial Dist  47        triphasic        0.17     +-------------+----------+---------+--------+--------+ Ulnar Dist   28        biphasic         0.1cm    +-------------+----------+---------+--------+--------+   Electronically signed by Deitra Mayo MD on 01/20/2021 at 4:12:51 PM.    Final    VAS [place] UPPER EXT VEIN MAPPING (PRE-OP AVF)  Result Date: 01/20/2021 UPPER EXTREMITY VEIN MAPPING  Indications: Pre-access. Limitations: Patient positioning, poor patient cooperation, patient movement,              patient pain tolerance Comparison Study: No prior studies. Performing Technologist: Oliver Hum RVT  Examination Guidelines: A complete evaluation includes B-mode imaging, spectral Doppler, color Doppler, and power Doppler as needed of all accessible portions of each vessel. Bilateral testing is considered an integral part of a complete examination. Limited examinations for reoccurring indications may be  performed as noted. +-----------------+-------------+----------+--------------+ Right Cephalic   Diameter (cm)Depth (cm)   Findings    +-----------------+-------------+----------+--------------+ Shoulder             0.22        1.20                  +-----------------+-------------+----------+--------------+ Prox upper arm       0.23        1.40                  +-----------------+-------------+----------+--------------+ Mid upper arm        0.24        1.60                  +-----------------+-------------+----------+--------------+ Dist upper arm       0.18        1.40                  +-----------------+-------------+----------+--------------+ Antecubital fossa    0.19        0.40                  +-----------------+-------------+----------+--------------+ Prox forearm         0.13        1.00                  +-----------------+-------------+----------+--------------+ Mid forearm          0.09        1.10                  +-----------------+-------------+----------+--------------+ Dist forearm                            not visualized +-----------------+-------------+----------+--------------+ +-----------------+-------------+----------+--------------+ Right Basilic    Diameter (cm)Depth (cm)   Findings    +-----------------+-------------+----------+--------------+ Shoulder             0.50  1.10                  +-----------------+-------------+----------+--------------+ Mid upper arm        0.43        0.98                  +-----------------+-------------+----------+--------------+ Dist upper arm       0.23        1.20     branching    +-----------------+-------------+----------+--------------+ Antecubital fossa    0.20        1.10     branching    +-----------------+-------------+----------+--------------+ Prox forearm         0.10        0.69                  +-----------------+-------------+----------+--------------+ Mid  forearm          0.06        0.49                  +-----------------+-------------+----------+--------------+ Distal forearm                          not visualized +-----------------+-------------+----------+--------------+ +-----------------+-------------+----------+--------+ Left Cephalic    Diameter (cm)Depth (cm)Findings +-----------------+-------------+----------+--------+ Shoulder             0.17        1.30            +-----------------+-------------+----------+--------+ Prox upper arm       0.13        1.20            +-----------------+-------------+----------+--------+ Mid upper arm        0.10        1.40            +-----------------+-------------+----------+--------+ Dist upper arm       0.08        0.96            +-----------------+-------------+----------+--------+ Antecubital fossa    0.06        0.52            +-----------------+-------------+----------+--------+ Prox forearm         0.16        0.63            +-----------------+-------------+----------+--------+ Mid forearm          0.15        0.69            +-----------------+-------------+----------+--------+ Dist forearm         0.09        0.33            +-----------------+-------------+----------+--------+ +-----------------+-------------+----------+--------------+ Left Basilic     Diameter (cm)Depth (cm)   Findings    +-----------------+-------------+----------+--------------+ Shoulder             0.22        1.40                  +-----------------+-------------+----------+--------------+ Mid upper arm        0.22        1.30                  +-----------------+-------------+----------+--------------+ Dist upper arm       0.18        1.20                  +-----------------+-------------+----------+--------------+ Antecubital fossa  0.20        0.87     branching    +-----------------+-------------+----------+--------------+ Prox forearm         0.06         0.66                  +-----------------+-------------+----------+--------------+ Mid forearm                             not visualized +-----------------+-------------+----------+--------------+ Distal forearm                          not visualized +-----------------+-------------+----------+--------------+ *See table(s) above for measurements and observations.  Diagnosing physician: Deitra Mayo MD Electronically signed by Deitra Mayo MD on 01/20/2021 at 4:12:32 PM.    Final    Medications: . sodium chloride    . sodium chloride    . magnesium sulfate bolus IVPB 2 g (01/21/21 1029)   . sodium chloride   Intravenous Once  . sodium chloride   Intravenous Once  . ARIPiprazole  5 mg Oral Daily  . Chlorhexidine Gluconate Cloth  6 each Topical Q0600  . hydrocerin   Topical BID  . hydrOXYzine  25 mg Oral BID  . lipase/protease/amylase  36,000 Units Oral TID  . midodrine  10 mg Oral Once per day on Mon Wed Fri  . pantoprazole  40 mg Oral Daily  . sertraline  100 mg Oral Daily    Outpatient Dialysis: [facility]  Assessment/Plan: # Malfunctioning HD catheter - IR recommended VVS consult. Seen by Dr. Oneida Alar 01/19/21 who recommends upper extremity central venogram and UE vein mapping, possibly converting femoral catheter to tunneled. Appreciate VVS assistance.  - has functioning R fem temp cath in place right now - has still in a R IJ TDC, did not work on 1/25  # ESRDon HD TTS -last HD was last night, labs here are good - Continue TTS schedule after new catheter is placed  # Covid 19 -off covid precautions per [facility] at this time.   # Anemia - CKD and possible acute component  - Improved with PRBC's (last given through HD catheter with HD on 1/22) -now appears to be stable in the 9's -will obtain outpatient ESA records  # Developmental Delay - Note APS provided consent for HD  # Hypokalemia - Received potassium 53mEq 1/25, using 4K bath with  dialysis.  -K+ now at goal  # Hypophos  - gently repleted, will recheck with next labs  Anice Paganini, PA-C 01/21/2021, 10:53 AM  [facility] Pager: [phone]  Patient seen and examined, agree with above note with above modifications. Very sleepy upon my interview but no complaints.  Had HD last night-  Ultimate plan is for VVS to do central venogram and get her a new TDC placed-  Next HD planned for tomorrow.  Had transfusion this hosp-  Will give mod dose darbe tomorrow with HD  Corliss Parish, MD 01/21/2021

## 2021-01-22 ENCOUNTER — Encounter (HOSPITAL_COMMUNITY): Payer: Self-pay | Admitting: Internal Medicine

## 2021-01-22 ENCOUNTER — Inpatient Hospital Stay (HOSPITAL_COMMUNITY): Payer: Medicare Other

## 2021-01-22 DIAGNOSIS — E038 Other specified hypothyroidism: Secondary | ICD-10-CM

## 2021-01-22 DIAGNOSIS — D65 Disseminated intravascular coagulation [defibrination syndrome]: Secondary | ICD-10-CM | POA: Diagnosis not present

## 2021-01-22 DIAGNOSIS — Z992 Dependence on renal dialysis: Secondary | ICD-10-CM

## 2021-01-22 LAB — RETICULOCYTES
Immature Retic Fract: 2.9 % (ref 2.3–15.9)
RBC.: 2.91 MIL/uL — ABNORMAL LOW (ref 3.87–5.11)
Retic Count, Absolute: 37.2 10*3/uL (ref 19.0–186.0)
Retic Ct Pct: 1.3 % (ref 0.4–3.1)

## 2021-01-22 LAB — MAGNESIUM: Magnesium: 2 mg/dL (ref 1.7–2.4)

## 2021-01-22 LAB — CBC WITH DIFFERENTIAL/PLATELET
Abs Immature Granulocytes: 0.03 10*3/uL (ref 0.00–0.07)
Basophils Absolute: 0 10*3/uL (ref 0.0–0.1)
Basophils Relative: 0 %
Eosinophils Absolute: 0 10*3/uL (ref 0.0–0.5)
Eosinophils Relative: 0 %
HCT: 22.2 % — ABNORMAL LOW (ref 36.0–46.0)
Hemoglobin: 8.1 g/dL — ABNORMAL LOW (ref 12.0–15.0)
Immature Granulocytes: 0 %
Lymphocytes Relative: 32 %
Lymphs Abs: 2.8 10*3/uL (ref 0.7–4.0)
MCH: 28.8 pg (ref 26.0–34.0)
MCHC: 36.5 g/dL — ABNORMAL HIGH (ref 30.0–36.0)
MCV: 79 fL — ABNORMAL LOW (ref 80.0–100.0)
Monocytes Absolute: 0.5 10*3/uL (ref 0.1–1.0)
Monocytes Relative: 5 %
Neutro Abs: 5.4 10*3/uL (ref 1.7–7.7)
Neutrophils Relative %: 63 %
Platelets: 30 10*3/uL — ABNORMAL LOW (ref 150–400)
RBC: 2.81 MIL/uL — ABNORMAL LOW (ref 3.87–5.11)
RDW: 17.4 % — ABNORMAL HIGH (ref 11.5–15.5)
WBC: 8.7 10*3/uL (ref 4.0–10.5)
nRBC: 0.2 % (ref 0.0–0.2)

## 2021-01-22 LAB — PROTIME-INR
INR: 1.7 — ABNORMAL HIGH (ref 0.8–1.2)
Prothrombin Time: 19.4 seconds — ABNORMAL HIGH (ref 11.4–15.2)

## 2021-01-22 LAB — D-DIMER, QUANTITATIVE: D-Dimer, Quant: 0.86 ug/mL-FEU — ABNORMAL HIGH (ref 0.00–0.50)

## 2021-01-22 LAB — PHOSPHORUS: Phosphorus: 1.8 mg/dL — ABNORMAL LOW (ref 2.5–4.6)

## 2021-01-22 LAB — AMMONIA: Ammonia: 23 umol/L (ref 9–35)

## 2021-01-22 LAB — COMPREHENSIVE METABOLIC PANEL
ALT: 21 U/L (ref 0–44)
AST: 23 U/L (ref 15–41)
Albumin: 1.7 g/dL — ABNORMAL LOW (ref 3.5–5.0)
Alkaline Phosphatase: 61 U/L (ref 38–126)
Anion gap: 11 (ref 5–15)
BUN: 19 mg/dL (ref 6–20)
CO2: 23 mmol/L (ref 22–32)
Calcium: 7 mg/dL — ABNORMAL LOW (ref 8.9–10.3)
Chloride: 108 mmol/L (ref 98–111)
Creatinine, Ser: 4.33 mg/dL — ABNORMAL HIGH (ref 0.44–1.00)
GFR, Estimated: 12 mL/min — ABNORMAL LOW (ref >60)
Glucose, Bld: 72 mg/dL (ref 70–99)
Potassium: 2.9 mmol/L — ABNORMAL LOW (ref 3.5–5.1)
Sodium: 142 mmol/L (ref 135–145)
Total Bilirubin: 1.8 mg/dL — ABNORMAL HIGH (ref 0.3–1.2)
Total Protein: 3.7 g/dL — ABNORMAL LOW (ref 6.5–8.1)

## 2021-01-22 LAB — DIC (DISSEMINATED INTRAVASCULAR COAGULATION)PANEL
D-Dimer, Quant: 0.85 ug/mL-FEU — ABNORMAL HIGH (ref 0.00–0.50)
Fibrinogen: <60 mg/dL — CL (ref 210–475)
INR: 1.6 — ABNORMAL HIGH (ref 0.8–1.2)
Platelets: 31 10*3/uL — ABNORMAL LOW (ref 150–400)
Prothrombin Time: 18.8 seconds — ABNORMAL HIGH (ref 11.4–15.2)
Smear Review: NONE SEEN
aPTT: 37 seconds — ABNORMAL HIGH (ref 24–36)

## 2021-01-22 LAB — GLUCOSE, CAPILLARY
Glucose-Capillary: 70 mg/dL (ref 70–99)
Glucose-Capillary: 75 mg/dL (ref 70–99)
Glucose-Capillary: 72 mg/dL (ref 70–99)
Glucose-Capillary: 111 mg/dL — ABNORMAL HIGH (ref 70–99)

## 2021-01-22 LAB — PROCALCITONIN: Procalcitonin: 31.65 ng/mL

## 2021-01-22 LAB — TECHNOLOGIST SMEAR REVIEW: Plt Morphology: DECREASED

## 2021-01-22 LAB — CORTISOL: Cortisol, Plasma: 29.1 ug/dL

## 2021-01-22 LAB — LIPASE, BLOOD: Lipase: 15 U/L (ref 11–51)

## 2021-01-22 LAB — VITAMIN B12: Vitamin B-12: 578 pg/mL (ref 180–914)

## 2021-01-22 LAB — FERRITIN: Ferritin: 1123 ng/mL — ABNORMAL HIGH (ref 11–307)

## 2021-01-22 LAB — TSH: TSH: 11.685 u[IU]/mL — ABNORMAL HIGH (ref 0.350–4.500)

## 2021-01-22 LAB — C-REACTIVE PROTEIN: CRP: 2.5 mg/dL — ABNORMAL HIGH (ref <1.0)

## 2021-01-22 LAB — T4, FREE: Free T4: 1.62 ng/dL — ABNORMAL HIGH (ref 0.61–1.12)

## 2021-01-22 LAB — LACTIC ACID, PLASMA: Lactic Acid, Venous: 6 mmol/L (ref 0.5–1.9)

## 2021-01-22 MED ORDER — VITAMIN K1 10 MG/ML IJ SOLN
2.0000 mg | Freq: Once | INTRAVENOUS | Status: AC
  Administered 2021-01-22: 2 mg via INTRAVENOUS
  Filled 2021-01-22: qty 0.2

## 2021-01-22 MED ORDER — SODIUM CHLORIDE 0.9% IV SOLUTION: Freq: Once | INTRAVENOUS | Status: DC

## 2021-01-22 MED ORDER — POTASSIUM CHLORIDE CRYS ER 20 MEQ PO TBCR
40.0000 meq | EXTENDED_RELEASE_TABLET | Freq: Once | ORAL | Status: AC
  Administered 2021-01-22: 40 meq via ORAL
  Filled 2021-01-22: qty 2

## 2021-01-22 MED ORDER — VANCOMYCIN HCL IN DEXTROSE 1-5 GM/200ML-% IV SOLN
1000.0000 mg | Freq: Once | INTRAVENOUS | Status: AC
  Administered 2021-01-22: 1000 mg via INTRAVENOUS
  Filled 2021-01-22: qty 200

## 2021-01-22 MED ORDER — SODIUM CHLORIDE 0.9% FLUSH: 3.0000 mL | Freq: Two times a day (BID) | INTRAVENOUS | Status: DC

## 2021-01-22 MED ORDER — POTASSIUM PHOSPHATES 15 MMOLE/5ML IV SOLN
20.0000 mmol | Freq: Once | INTRAVENOUS | Status: AC
  Administered 2021-01-22: 20 mmol via INTRAVENOUS
  Filled 2021-01-22: qty 6.67

## 2021-01-22 MED ORDER — SODIUM CHLORIDE 0.9% FLUSH: 3.0000 mL | INTRAVENOUS | Status: DC | PRN

## 2021-01-22 MED ORDER — SODIUM CHLORIDE 0.9 % IV SOLN
2.0000 g | Freq: Once | INTRAVENOUS | Status: AC
  Administered 2021-01-22: 2 g via INTRAVENOUS
  Filled 2021-01-22: qty 2

## 2021-01-22 MED ORDER — DARBEPOETIN ALFA 150 MCG/0.3ML IJ SOSY: 150.0000 ug | PREFILLED_SYRINGE | INTRAMUSCULAR | Status: DC

## 2021-01-22 MED ORDER — ONDANSETRON HCL 4 MG/2ML IJ SOLN
4.0000 mg | Freq: Four times a day (QID) | INTRAMUSCULAR | Status: DC | PRN
  Administered 2021-01-22 – 2021-01-24 (×3): 4 mg via INTRAVENOUS
  Filled 2021-01-22 (×2): qty 2

## 2021-01-22 MED ORDER — DEXTROSE 10 % IV SOLN: INTRAVENOUS | Status: DC

## 2021-01-22 MED ORDER — FAMOTIDINE IN NACL 20-0.9 MG/50ML-% IV SOLN
20.0000 mg | INTRAVENOUS | Status: DC
  Administered 2021-01-23 (×2): 20 mg via INTRAVENOUS
  Filled 2021-01-22 (×2): qty 50

## 2021-01-22 MED ORDER — SODIUM CHLORIDE 0.9 % IV SOLN: 2.0000 g | INTRAVENOUS | Status: DC

## 2021-01-22 MED ORDER — K PHOS MONO-SOD PHOS DI & MONO 155-852-130 MG PO TABS
250.0000 mg | ORAL_TABLET | Freq: Every day | ORAL | Status: AC
  Administered 2021-01-22: 250 mg via ORAL
  Filled 2021-01-22: qty 1

## 2021-01-22 MED ORDER — SODIUM CHLORIDE 0.9 % IV SOLN: 250.0000 mL | INTRAVENOUS | Status: DC | PRN

## 2021-01-22 NOTE — Progress Notes (Signed)
HOSPITAL MEDICINE OVERNIGHT EVENT NOTE    Notified by nursing that patient is an extremely difficult stick and despite multiple times only 1 set of blood cultures have been able to be obtained.  Patient is due for antibiotics for treatment of sepsis and is quite ill and is additionally in DIC.  I recommended that we proceed with antibiotic administration as soon as possible if we are unable to obtain a second set of blood cultures.  Vernelle Emerald  MD [facility]

## 2021-01-22 NOTE — Progress Notes (Signed)
[facility] Progress Note   Subjective:   Pt pulled out femoral catheter overnight. BP soft, Hgb and plt both lower this AM. Planned for platelets and new access tomorrow. K+ 2.9.  Pt sleeping, awakens briefly to voice and says, "I'm fine," otherwise does not answer ROS questions.   Objective Vitals:   01/21/21 2054 01/21/21 2055 01/21/21 2120 01/22/21 0300  BP: (!) 85/51 (!) 85/51 (!) 96/53 106/66  Pulse: 100 100  98  Resp: 18 18  18   Temp: 98.3 F (36.8 C) 98.3 F (36.8 C)  (!) 97.5 F (36.4 C)  TempSrc: Oral Oral  Oral  SpO2: 100% 100%  100%  Weight:      Height:       Physical Exam General: WDWN female in NAD Heart: RRR, no murmurs, rubs or gallops Lungs: Respirations unlabored. Poor cooperation with exam but no wheezing or rales ausculated Abdomen: Soft, non-distended, +BS Extremities: No edema b/l lower extremities Dialysis Access: R IJ TDC, no active bleeding at site of previous femoral cath  Additional Objective Labs: Basic Metabolic Panel: Recent Labs  Lab 01/18/21 0738 01/19/21 0247 01/20/21 0338 01/21/21 0400 01/22/21 0813  NA 136 135 139 138 142  K 3.0* 3.0* 2.7* 3.9 2.9*  CL 98 100 103 104 108  CO2 22 23 25 23 23   GLUCOSE 61* 60* 86 75 72  BUN 12 12 19 13 19   CREATININE 3.43* 3.85* 4.68* 3.14* 4.33*  CALCIUM 7.5* 7.0* 7.1* 6.9* 7.0*  PHOS 1.9* 2.2*  --   --  1.8*   Liver Function Tests: Recent Labs  Lab 01/18/21 0738 01/19/21 0247 01/22/21 0813  AST  --   --  23  ALT  --   --  21  ALKPHOS  --   --  61  BILITOT  --   --  1.8*  PROT  --   --  3.7*  ALBUMIN 2.3* 1.8* 1.7*   CBC: Recent Labs  Lab 01/06/2021 1732 01/17/21 0500 01/17/21 1327 01/18/21 0738 01/20/21 0338 01/20/21 1925 01/21/21 0400 01/22/21 0813  WBC 8.2   < > 9.1 12.1* 10.2  --  11.9* 8.7  NEUTROABS 4.0  --  7.2  --   --   --   --  5.4  HGB 6.0*   < > 6.3* 9.8* 7.1* 9.4* 9.2* 8.1*  HCT 18.0*   < > 18.9* 28.2* 20.8* 26.2* 25.8* 22.2*  MCV 84.1   < >  82.9 81.3 80.3  --  79.1* 79.0*  PLT 134*   < > 121* 120* 52*  --  39* 30*   < > = values in this interval not displayed.   Blood Culture    Component Value Date/Time   SDES URINE, RANDOM 09/05/2020 1855   SPECREQUEST  09/05/2020 1855    NONE Performed at [facility], [place] [address], [place], [place] [address]    CULT MULTIPLE SPECIES PRESENT, SUGGEST RECOLLECTION (A) 09/05/2020 1855   REPTSTATUS 09/07/2020 FINAL 09/05/2020 1855   CBG: Recent Labs  Lab 01/21/21 1148 01/21/21 1600 01/21/21 1659 01/21/21 2047 01/22/21 0747  GLUCAP 75 81 82 83 70   Iron Studies:  Recent Labs    01/22/21 0813  FERRITIN 1,123*    Studies/Results: VAS [place] ABI WITH/WO TBI  Result Date: 01/20/2021 LOWER EXTREMITY DOPPLER STUDY Indications: AVF creation. High Risk Factors: Hypertension.  Limitations: Today's exam was limited due to patient positioning, patient unable  to lie flat, patient intolerant to cuff pressure, involuntary              patient movement, bandages and poor patient cooperation. Comparison Study: No prior studies. Performing Technologist: Carlos Levering RVT  Examination Guidelines: A complete evaluation includes at minimum, Doppler waveform signals and systolic blood pressure reading at the level of bilateral brachial, anterior tibial, and posterior tibial arteries, when vessel segments are accessible. Bilateral testing is considered an integral part of a complete examination. Photoelectric Plethysmograph (PPG) waveforms and toe systolic pressure readings are included as required and additional duplex testing as needed. Limited examinations for reoccurring indications may be performed as noted.  ABI Findings: +--------+------------------+-----+---------+------------------+ Right   Rt Pressure (mmHg)IndexWaveform Comment            +--------+------------------+-----+---------+------------------+ [id number]                    triphasic                    +--------+------------------+-----+---------+------------------+ PTA                                     Unable to insonate +--------+------------------+-----+---------+------------------+ DP                             absent                      +--------+------------------+-----+---------+------------------+ +--------+------------------+-----+--------+------------------+ Left    Lt Pressure (mmHg)IndexWaveformComment            +--------+------------------+-----+--------+------------------+ Brachial                               Unable to insonate +--------+------------------+-----+--------+------------------+ PTA     105               0.93                            +--------+------------------+-----+--------+------------------+ DP      112               0.99                            +--------+------------------+-----+--------+------------------+ +-------+-----------+-----------+------------+------------+ ABI/TBIToday's ABIToday's TBIPrevious ABIPrevious TBI +-------+-----------+-----------+------------+------------+ Left   0.99                                           +-------+-----------+-----------+------------+------------+  Summary: Right: Unable to obtain ABI due to patient positioning, and poor patient cooperation. Left: Resting left ankle-brachial index is within normal range. No evidence of significant left lower extremity arterial disease.  *See table(s) above for measurements and observations.  Electronically signed by Deitra Mayo MD on 01/20/2021 at 4:11:22 PM.    Final    VAS [place] UPPER EXTREMITY ARTERIAL DUPLEX  Result Date: 01/20/2021 UPPER EXTREMITY DUPLEX STUDY Indications: Patient complains of AVF creation.  Risk Factors: Hypertension. Limitations: Patient positioning, poor patient cooperation, patient movement,              patient pain tolerance Comparison Study: 09/05/2020 Performing Technologist: Oliver Hum RVT  Examination  Guidelines: A complete  evaluation includes B-mode imaging, spectral Doppler, color Doppler, and power Doppler as needed of all accessible portions of each vessel. Bilateral testing is considered an integral part of a complete examination. Limited examinations for reoccurring indications may be performed as noted.  Right Doppler Findings: +---------------+----------+---------+--------+--------+ Site           PSV (cm/s)Waveform StenosisComments +---------------+----------+---------+--------+--------+ Subclavian Dist          triphasic                 +---------------+----------+---------+--------+--------+ Brachial Dist  74        triphasic        0.32cm   +---------------+----------+---------+--------+--------+ Radial Dist    47        triphasic        0.15cm   +---------------+----------+---------+--------+--------+ Ulnar Dist     31        triphasic        0.12cm   +---------------+----------+---------+--------+--------+   Left Doppler Findings: +-------------+----------+---------+--------+--------+ Site         PSV (cm/s)Waveform StenosisComments +-------------+----------+---------+--------+--------+ Brachial Dist89        triphasic        0.23cm   +-------------+----------+---------+--------+--------+ Radial Dist  47        triphasic        0.17     +-------------+----------+---------+--------+--------+ Ulnar Dist   28        biphasic         0.1cm    +-------------+----------+---------+--------+--------+   Electronically signed by Deitra Mayo MD on 01/20/2021 at 4:12:51 PM.    Final    VAS [place] UPPER EXT VEIN MAPPING (PRE-OP AVF)  Result Date: 01/20/2021 UPPER EXTREMITY VEIN MAPPING  Indications: Pre-access. Limitations: Patient positioning, poor patient cooperation, patient movement,              patient pain tolerance Comparison Study: No prior studies. Performing Technologist: Oliver Hum RVT  Examination Guidelines: A complete evaluation  includes B-mode imaging, spectral Doppler, color Doppler, and power Doppler as needed of all accessible portions of each vessel. Bilateral testing is considered an integral part of a complete examination. Limited examinations for reoccurring indications may be performed as noted. +-----------------+-------------+----------+--------------+ Right Cephalic   Diameter (cm)Depth (cm)   Findings    +-----------------+-------------+----------+--------------+ Shoulder             0.22        1.20                  +-----------------+-------------+----------+--------------+ Prox upper arm       0.23        1.40                  +-----------------+-------------+----------+--------------+ Mid upper arm        0.24        1.60                  +-----------------+-------------+----------+--------------+ Dist upper arm       0.18        1.40                  +-----------------+-------------+----------+--------------+ Antecubital fossa    0.19        0.40                  +-----------------+-------------+----------+--------------+ Prox forearm         0.13        1.00                  +-----------------+-------------+----------+--------------+  Mid forearm          0.09        1.10                  +-----------------+-------------+----------+--------------+ Dist forearm                            not visualized +-----------------+-------------+----------+--------------+ +-----------------+-------------+----------+--------------+ Right Basilic    Diameter (cm)Depth (cm)   Findings    +-----------------+-------------+----------+--------------+ Shoulder             0.50        1.10                  +-----------------+-------------+----------+--------------+ Mid upper arm        0.43        0.98                  +-----------------+-------------+----------+--------------+ Dist upper arm       0.23        1.20     branching     +-----------------+-------------+----------+--------------+ Antecubital fossa    0.20        1.10     branching    +-----------------+-------------+----------+--------------+ Prox forearm         0.10        0.69                  +-----------------+-------------+----------+--------------+ Mid forearm          0.06        0.49                  +-----------------+-------------+----------+--------------+ Distal forearm                          not visualized +-----------------+-------------+----------+--------------+ +-----------------+-------------+----------+--------+ Left Cephalic    Diameter (cm)Depth (cm)Findings +-----------------+-------------+----------+--------+ Shoulder             0.17        1.30            +-----------------+-------------+----------+--------+ Prox upper arm       0.13        1.20            +-----------------+-------------+----------+--------+ Mid upper arm        0.10        1.40            +-----------------+-------------+----------+--------+ Dist upper arm       0.08        0.96            +-----------------+-------------+----------+--------+ Antecubital fossa    0.06        0.52            +-----------------+-------------+----------+--------+ Prox forearm         0.16        0.63            +-----------------+-------------+----------+--------+ Mid forearm          0.15        0.69            +-----------------+-------------+----------+--------+ Dist forearm         0.09        0.33            +-----------------+-------------+----------+--------+ +-----------------+-------------+----------+--------------+ Left Basilic     Diameter (cm)Depth (cm)   Findings    +-----------------+-------------+----------+--------------+ Shoulder  0.22        1.40                  +-----------------+-------------+----------+--------------+ Mid upper arm        0.22        1.30                   +-----------------+-------------+----------+--------------+ Dist upper arm       0.18        1.20                  +-----------------+-------------+----------+--------------+ Antecubital fossa    0.20        0.87     branching    +-----------------+-------------+----------+--------------+ Prox forearm         0.06        0.66                  +-----------------+-------------+----------+--------------+ Mid forearm                             not visualized +-----------------+-------------+----------+--------------+ Distal forearm                          not visualized +-----------------+-------------+----------+--------------+ *See table(s) above for measurements and observations.  Diagnosing physician: Deitra Mayo MD Electronically signed by Deitra Mayo MD on 01/20/2021 at 4:12:32 PM.    Final    Medications: . sodium chloride    . sodium chloride    . dextrose 20 mL/hr at 01/22/21 0941   . ARIPiprazole  5 mg Oral Daily  . Chlorhexidine Gluconate Cloth  6 each Topical Q0600  . darbepoetin (ARANESP) injection - DIALYSIS  150 mcg Intravenous Q Thu-HD  . hydrocerin   Topical BID  . hydrOXYzine  25 mg Oral BID  . lipase/protease/amylase  36,000 Units Oral TID  . midodrine  10 mg Oral Once per day on Mon Wed Fri  . pantoprazole  40 mg Oral Daily  . sertraline  100 mg Oral Daily     [facility]  Assessment/Plan: # Malfunctioning HD catheter - IR recommended VVSconsult. Seen by Dr. Oneida Alar 01/19/21 who recommends upper extremity central venogram and new [facility] tomorrow. Appreciate VVS assistance. - has still in a R IJ TDC, did not work on 1/25 - R temp femoral catheter was functional but pt removed it overnight- currently without HD access  # ESRDon HD TTS -No volume overload on exam, BUN and Cr stable -Cannot do HD today- no access, but no urgent indications -Next HD tomorrow after new access  # Covid 19 -off covid precautions  per [facility] at this time.  # Anemia - CKD and possible acute component  - Improved with PRBC's (last given through HD catheter with HD on 1/22) -now appears to be stable in the 9's -will obtain outpatient [facility] records  # Developmental Delay - Note APS provided consent for HD  # Hypokalemia -Received potassium 29mEq 1/25, using 4K bath with dialysis. -K+ 2.9, giving another dose of potassium 23mEq today.  # Hypophos  - gently repleted on 1/23 -phos back down to 1.8, will gently replete again with K phos neutral  - Liberalize diet to regular with fluid restrictions once she is no longer NPO- does not need renal diet.   Anice Paganini, PA-C 01/22/2021, 9:45 AM  [facility] Pager: [phone]

## 2021-01-22 NOTE — Progress Notes (Signed)
Pharmacy Antibiotic Note  Tamara [name] is a 45 y.o. female admitted on 01/06/2021 with DIC and possible sepsis.  Pharmacy has been consulted for cefepime dosing. Patient is on dialysis.  Afebrile, WBC wnl. CXR and CT chest/abd/pelvis pending.   Plan: Cefepime 2g q24 hr  Monitor cultures, clinical status  Narrow abx as able and f/u duration    Height: 5\' 5"  (165.1 cm) Weight: 66.4 kg (146 lb 6.2 oz) IBW/kg (Calculated) : 57  Temp (24hrs), Avg:98 F (36.7 C), Min:97.5 F (36.4 C), Max:98.3 F (36.8 C)  Recent Labs  Lab 01/17/21 1327 01/18/21 0738 01/19/21 0247 01/20/21 0338 01/21/21 0400 01/22/21 0813  WBC 9.1 12.1*  --  10.2 11.9* 8.7  CREATININE  --  3.43* 3.85* 4.68* 3.14* 4.33*    Estimated Creatinine Clearance: 15.1 mL/min (A) (by C-G formula based on SCr of 4.33 mg/dL (H)).    Allergies  Allergen Reactions  . Penicillins   . Sulfa Antibiotics     Antimicrobials this admission: Cefe 1/27  >>  Vanc  1/27 >>    Microbiology results: 1/27 BCx: pend 1/27 UCx: pend  1/27 sputum: pend 1/27 MRSA PCR: pend  Thank you for allowing pharmacy to be a part of this patient's care.   Benetta Spar, PharmD, BCPS, BCCP Clinical Pharmacist  Please check AMION for all [place] phone numbers After 10:00 PM, call [place] [phone]

## 2021-01-22 NOTE — Progress Notes (Addendum)
Received pt from [facility]N. Pt placed on progressive monitor. VS and weight taken. Patient ID band placed.

## 2021-01-22 NOTE — Anesthesia Preprocedure Evaluation (Addendum)
Anesthesia Evaluation  Patient identified by MRN, date of birth, ID band Patient confused    Reviewed: Allergy & Precautions, NPO status , Patient's Chart, lab work & pertinent test results  Airway Mallampati: II  TM Distance: >3 FB Neck ROM: Full    Dental no notable dental hx. (+) Teeth Intact, Dental Advisory Given   Pulmonary neg pulmonary ROS,  covid 19+   Pulmonary exam normal breath sounds clear to auscultation       Cardiovascular Exercise Tolerance: Good hypertension, Pt. on medications + Past MI  Normal cardiovascular exam Rhythm:Regular Rate:Normal     Neuro/Psych PSYCHIATRIC DISORDERS Depression Dementia  Neuromuscular disease    GI/Hepatic GERD  Medicated,  Endo/Other  negative endocrine ROS  Renal/GU ESRF and DialysisRenal disease     Musculoskeletal negative musculoskeletal ROS (+)   Abdominal   Peds  Hematology  (+) Blood dyscrasia, anemia , PT in DIC   Anesthesia Other Findings   Reproductive/Obstetrics                           Anesthesia Physical Anesthesia Plan  ASA: III  Anesthesia Plan: General   Post-op Pain Management:    Induction: Intravenous  PONV Risk Score and Plan: 3 and Treatment may vary due to age or medical condition, Ondansetron, Dexamethasone and Midazolam  Airway Management Planned: LMA  Additional Equipment: None  Intra-op Plan:   Post-operative Plan:   Informed Consent: I have reviewed the patients History and Physical, chart, labs and discussed the procedure including the risks, benefits and alternatives for the proposed anesthesia with the patient or authorized representative who has indicated his/her understanding and acceptance.     Dental advisory given  Plan Discussed with: CRNA and Anesthesiologist  Anesthesia Plan Comments:        Anesthesia Quick Evaluation

## 2021-01-22 NOTE — Progress Notes (Addendum)
PROGRESS NOTE    Tamara [name]  WYO:[id number] DOB: [birth year] DOA: 01/13/2021 PCP: Hal Morales, DO   Brief Narrative:  45 year old with history of ESRD HD, developmental delay/diminished mental capacity, HTN, COVID-19 with ARDS February 2021, depression, anxiety admitted for malfunction of hemodialysis catheter initially at [facility] was unsuccessful therefore general surgery placed nontunneled catheter and patient was transferred to [facility] for vascular surgery evaluation.  Also incidental finding of COVID-19 infection, largely asymptomatic.   Vascular surgery, nephrology as well as hematology consulted. Likely suffering from DIC. Currently plan is to close monitor and treat DIC.  Assessment & Plan: HD catheter malfunction. Failed TPA. General surgery at [facility] placed temporary HD catheter. Patient was transferred to [facility] for vascular evaluation. Currently scheduled for [facility] placement on 01/15/2021. Patient pulled out temp HD catheter on 01/21/2021. If platelets does not improve vascular surgery may recommend attempt HD catheter placement again.  Severe thrombocytopenia. DIC Anemia of chronic kidney disease with acute component. Hematology consulted due to ongoing worsening platelets. HIT panel sent. Work-up currently supports possible diagnosis of DIC given low fibrinogen level and scattered schistocytes. Currently receiving cryoprecipitate. No role of anticoagulation for now protection currently no evidence of thrombosis. Due to severe anemia on presentation received 1 PRBC transfusion. No evidence of active bleeding.  H&H relatively stable.  Continue to monitor.  Concern for sepsis. Given patient's current diagnosis of DIC will rule out sepsis. Empirically IV cefepime. Check MRSA PCR.  If positive empirically also added vancomycin.  ESRD on HD Nephrology currently following. Receiving hemodialysis via temporary  catheter. Currently missed HD on 1/27 although no indication right now given normal volume and stable electrolytes.  Hypokalemia, hypomagnesemia, hypophosphatemia. Currently being replaced. Monitor.  Hypoglycemia From poor oral intake.  Continue to monitor.  Encourage oral diet.  On D10 drip. Monitor CBG every 4 hours.  Essential hypertension. Chronic hypotension. On midodrine 3 times a week. May need to increase the frequency to daily. Currently not on any antihypertensive medication blood pressure is actually rather soft. If the blood pressure drops patient will benefit from being on IV albumin.  COVID-19 infection, asymptomatic Initially tested positive-01/06/2021.  As documented results. Isolation discontinued Continue supportive care.  Saturating well on room air.  Chronic pancreatitis Intractable nausea and vomiting. On Creon Recently hospitalized for acute pancreatitis. Check x-ray abdomen for acute abnormality. CT abdomen ordered for tomorrow. Check lipase level as well. Currently remains NPO.  Hypothyroidism, currently sick euthyroid syndrome Free T4 elevated, TSH also elevated.  Currently no therapy.  Recheck in 4 weeks.  Acute metabolic encephalopathy Progressively confused and lethargic. CT head unremarkable. Likely consistent with DIC. Although etiology still not clear. Discontinue psychotropic medication. Progressive care level transfer. Monitor on telemetry.  Depression. On Zoloft at home. Currently on hold. Monitor.  Bilateral upper extremity bruising. Etiology not clear. Monitor for now.  Elevated INR. 1 dose of IV vitamin K. We will recheck.  DVT prophylaxis: SCDs Code Status: Full Family Communication: No family at bedside.  Attempt to reach legal guardian unsuccessful.  Will reattempt tomorrow. Status is: Inpatient  Remains inpatient appropriate because:Inpatient level of care appropriate due to severity of illness   Dispo: The  patient is from: ALF              Anticipated d/c is to: ALF              Anticipated d/c date is: More than 3 days  Patient currently is not medically stable to d/c.    Difficult to place patient No  Subjective: Appears more tired and lethargic.  No nausea no vomiting earlier during the day but later in the day had an episode of vomiting.  No abdominal pain.  More alert at the evening time.  Physical exam. General: Appear in mild distress,  Diffuse bruising of bilateral upper extremity with edema; Oral Mucosa Clear, dry. no Abnormal Neck Mass Or lumps, Conjunctiva normal  Cardiovascular: S1 and S2 Present, no Murmur, Respiratory: increased respiratory effort, Bilateral Air entry present and bilateral  Crackles, no wheezes Abdomen: Bowel Sound present, Soft and no tenderness Extremities: trace Pedal edema Neurology: Lethargic and not oriented to time, place, and person affect flat. no new focal deficit Gait not checked due to patient safety concerns    Objective: Vitals:   01/21/21 2055 01/21/21 2120 01/22/21 0300 01/22/21 1558  BP: (!) 85/51 (!) 96/53 106/66 (!) 102/58  Pulse: 100  98 97  Resp: 18  18 17   Temp: 98.3 F (36.8 C)  (!) 97.5 F (36.4 C) 97.8 F (36.6 C)  TempSrc: Oral  Oral Oral  SpO2: 100%  100% 98%  Weight:      Height:        Intake/Output Summary (Last 24 hours) at 01/22/2021 1931 Last data filed at 01/22/2021 1746 Gross per 24 hour  Intake 0 ml  Output -  Net 0 ml   Filed Weights   12/28/2020 1222 01/20/21 2300 01/21/21 0145  Weight: 70 kg 66.9 kg 66.4 kg     Data Reviewed:   CBC: Recent Labs  Lab 12/27/2020 1732 01/17/21 0500 01/17/21 1327 01/18/21 0738 01/20/21 0338 01/20/21 1925 01/21/21 0400 01/22/21 0813 01/22/21 1600  WBC 8.2   < > 9.1 12.1* 10.2  --  11.9* 8.7  --   NEUTROABS 4.0  --  7.2  --   --   --   --  5.4  --   HGB 6.0*   < > 6.3* 9.8* 7.1* 9.4* 9.2* 8.1*  --   HCT 18.0*   < > 18.9* 28.2* 20.8* 26.2* 25.8* 22.2*   --   MCV 84.1   < > 82.9 81.3 80.3  --  79.1* 79.0*  --   PLT 134*   < > 121* 120* 52*  --  39* 30* 31*   < > = values in this interval not displayed.   Basic Metabolic Panel: Recent Labs  Lab 01/18/21 0738 01/19/21 0247 01/20/21 0338 01/21/21 0400 01/22/21 0813  NA 136 135 139 138 142  K 3.0* 3.0* 2.7* 3.9 2.9*  CL 98 100 103 104 108  CO2 22 23 25 23 23   GLUCOSE 61* 60* 86 75 72  BUN 12 12 19 13 19   CREATININE 3.43* 3.85* 4.68* 3.14* 4.33*  CALCIUM 7.5* 7.0* 7.1* 6.9* 7.0*  MG  --   --  1.4* 1.5* 2.0  PHOS 1.9* 2.2*  --   --  1.8*   GFR: Estimated Creatinine Clearance: 15.1 mL/min (A) (by C-G formula based on SCr of 4.33 mg/dL (H)). Liver Function Tests: Recent Labs  Lab 01/18/21 0738 01/19/21 0247 01/22/21 0813  AST  --   --  23  ALT  --   --  21  ALKPHOS  --   --  61  BILITOT  --   --  1.8*  PROT  --   --  3.7*  ALBUMIN 2.3* 1.8* 1.7*   No  results for input(s): LIPASE, AMYLASE in the last 168 hours. Recent Labs  Lab 01/22/21 1306  AMMONIA 23   Coagulation Profile: Recent Labs  Lab 01/22/21 0813 01/22/21 1600  INR 1.7* 1.6*   Cardiac Enzymes: No results for input(s): CKTOTAL, CKMB, CKMBINDEX, TROPONINI in the last 168 hours. BNP (last 3 results) No results for input(s): PROBNP in the last 8760 hours. HbA1C: No results for input(s): HGBA1C in the last 72 hours. CBG: Recent Labs  Lab 01/21/21 1659 01/21/21 2047 01/22/21 0747 01/22/21 1140 01/22/21 1641  GLUCAP 82 83 70 75 72   Lipid Profile: No results for input(s): CHOL, HDL, LDLCALC, TRIG, CHOLHDL, LDLDIRECT in the last 72 hours. Thyroid Function Tests: Recent Labs    01/22/21 1306 01/22/21 1344  TSH 11.685*  --   FREET4  --  1.62*   Anemia Panel: Recent Labs    01/21/21 0400 01/22/21 0813 01/22/21 1344  VITAMINB12  --   --  578  FERRITIN 1,277* 1,123*  --   RETICCTPCT  --  1.3  --    Sepsis Labs: No results for input(s): PROCALCITON, LATICACIDVEN in the last 168  hours.  Recent Results (from the past 240 hour(s))  SARS Coronavirus 2 by RT PCR (hospital order, performed in [facility] hospital lab) Nasopharyngeal Nasopharyngeal Swab     Status: Abnormal   Collection Time: 01/06/2021  8:29 PM   Specimen: Nasopharyngeal Swab  Result Value Ref Range Status   SARS Coronavirus 2 POSITIVE (A) NEGATIVE Final    Comment: RESULT CALLED TO, READ BACK BY AND VERIFIED WITH: C. TURNER 1/21 @ 2232 BY S. BEARD. (NOTE) SARS-CoV-2 target nucleic acids are DETECTED  SARS-CoV-2 RNA is generally detectable in upper respiratory specimens  during the acute phase of infection.  Positive results are indicative  of the presence of the identified virus, but do not rule out bacterial infection or co-infection with other pathogens not detected by the test.  Clinical correlation with patient history and  other diagnostic information is necessary to determine patient infection status.  The expected result is negative.  Fact Sheet for Patients:   StrictlyIdeas.no   Fact Sheet for Healthcare Providers:   BankingDealers.co.za    This test is not yet approved or cleared by the [place] FDA and  has been authorized for detection and/or diagnosis of SARS-CoV-2 by FDA under an Emergency Use Authorization (EUA).  This EUA will remain in effect (meaning this te st can be used) for the duration of  the COVID-19 declaration under Section 564(b)(1) of the Act, 21 U.S.C. section 360-bbb-3(b)(1), unless the authorization is terminated or revoked sooner.  Performed at [facility], [address], [place], [place] [address]          Radiology Studies: CT HEAD WO CONTRAST  Result Date: 01/22/2021 CLINICAL DATA:  Mental status change, unknown cause EXAM: CT HEAD WITHOUT CONTRAST TECHNIQUE: Contiguous axial images were obtained from the base of the skull through the vertex without intravenous contrast. COMPARISON:  None. FINDINGS:  Brain: No acute infarct or intracranial hemorrhage. No mass lesion. No midline shift, ventriculomegaly or extra-axial fluid collection. Vascular: No hyperdense vessel or unexpected calcification. Skull: Negative for fracture or focal lesion. Sinuses/Orbits: Normal orbits. Right maxillary sinus mucous retention cyst. No mastoid effusion. Other: None. IMPRESSION: No acute intracranial process. Electronically Signed   By: Primitivo Gauze M.D.   On: 01/22/2021 14:17        Scheduled Meds: . sodium chloride   Intravenous Once  .  Chlorhexidine Gluconate Cloth  6 each Topical Q0600  . [START ON 01/24/2021] darbepoetin (ARANESP) injection - DIALYSIS  150 mcg Intravenous Q Fri-HD  . hydrocerin   Topical BID  . lipase/protease/amylase  36,000 Units Oral TID  . midodrine  10 mg Oral Once per day on Mon Wed Fri   Continuous Infusions: . sodium chloride    . sodium chloride    . ceFEPime (MAXIPIME) IV    . dextrose 20 mL/hr at 01/22/21 1608  . famotidine (PEPCID) IV    . potassium PHOSPHATE IVPB (in mmol) 20 mmol (01/22/21 1830)  . vancomycin       LOS: 4 days   Time spent= 35 mins    Berle Mull, MD [facility]  If 7PM-7AM, please contact night-coverage  01/22/2021, 7:31 PM

## 2021-01-22 NOTE — Progress Notes (Signed)
Interventional Radiology Brief Note:  IR consulted for replacement of R femoral temporary catheter after patient pulled her line overnight.   Review of chart shows patient is well known to vascular and IR services with plans for OR tomorrow with Dr. Oneida Alar.  She has a non-functioning R IJ tunneled catheter.   As requested, PA to bedside to assess patient for possible line replacement, however found patient soiled with gown and mittens removed.  She has removed her left(?) arm IV and it is tucked up under her still running. Appears there are some new skin tears due to pulling at her lines.  Bed saturated with blood and saline from IV.  Care team called to assist with cleaning.    Appears IR consult for temp catheter replacement has since been canceled.  No plans for IR intervention today.   Brynda Greathouse, MS RD PA-C

## 2021-01-22 NOTE — Consult Note (Addendum)
[facility]  [phone] [phone]    [place]  Referring MD:  Dr. Berle Mull  Reason for Referral: Thrombocytopenia  HPI: Tamara [name] is a 45 year old female with a past medical history significant for end-stage renal disease on hemodialysis, diminished mental capacity/developmental delay, history of hypertension, history of Covid with ours in February 2021, depression, anxiety.  The patient was sent to the hospital for evaluation of a clogged hemodialysis catheter.  According to records, her hemodialysis schedule was altered due to recent COVID-19 infection.  It appears as though she was positive on 01/06/2021 and was asymptomatic.  Isolation has been discontinued.  CBC on admission showed a normal CBC of 8.2, low hemoglobin of 6.0, and mildly low platelets of 134,000.  Most recent CBC prior to this admission was on 09/12/2020 and showed a hemoglobin of 10.4 and platelet count 363,000.  They showed a normal WBC of 8.7, hemoglobin 8.1, and platelets down to 30,000.  Platelets appear to be slowly drifting down this admission.  The patient has received 4 units PRBC so far this admission.  Technologist review of peripheral blood smear was performed earlier today which showed smudge cells, atypical lymphs, increased bands (greater than 20% bands), target cells, burr cells, ovalocytes.  Pathologist reviewed peripheral blood smear today for me and there was no evidence of schistocytes. HIT panel has been ordered and is currently pending.  Reticulocytes performed earlier today resulted with a reticulocyte count percent 1.3%, absolute reticulocyte count of 37.2, and immature reticulocyte fraction of 2.9%.  The patient is scheduled for central venogram and catheter tomorrow by vascular surgery.  Platelets have been ordered on call for the procedure.  When seen today, the patient is getting personal care.  The patient is for the most part nonverbal.   However, she can tell me that she is not having any pain.  She does moan out in pain at times when personal care is being performed.  No fevers documented.  Discussed with nursing staff at the bedside who states she has had a small amount of bleeding from some of her skin excoriation between her legs.  They have not noticed any other bleeding such as epistaxis, hemoptysis, hematemesis, hematuria, melena, hematochezia. Hematology was asked to see the patient to make recommendations regarding her thrombocytopenia.   Past Medical History:  Diagnosis Date  . Abnormal uterine bleeding (AUB) 01/26/2016  . Acute pancreatitis without necrosis or infection, unspecified   . Anemia in chronic kidney disease (CKD)   . Anxiety and depression    mentally slow  . Cardiac arrhythmia   . Chronic abdominal pain   . COVID-19   . Dependence on renal dialysis ([place])   . Depression   . Gastro-esophageal reflux disease without esophagitis   . Generalized anxiety disorder   . Gout   . Hematuria 01/26/2016  . Hyperlipidemia   . Hypertension   . Hypomagnesemia   . Leg pain   . Major depressive disorder   . Obesity   . Ovarian cyst 02/10/2016  . Personal history of sudden cardiac arrest   . Pseudocyst of pancreas   . PUD (peptic ulcer disease)   . Pure hypercholesterolemia   . Reflux   . Renal disorder   . S/P colonoscopy 03/15/11   friable anal canal  . Sebaceous cyst of labia 01/26/2016  . Tachycardia   . Unspecified psychosis not due to a substance or known physiological condition ([place])   . Yeast vaginitis 09/19/2013  :  Past Surgical History:  Procedure Laterality Date  . IR FLUORO GUIDE CV LINE RIGHT  12/05/2020  . IR FLUORO GUIDE CV LINE RIGHT  12/12/2020  . IR FLUORO GUIDE CV LINE RIGHT  12/29/2020  . IR INTRAVASCULAR ULTRASOUND NON CORONARY  12/29/2020  . IR PTA VENOUS ADDL EXCEPT DIALYSIS CIRCUIT  12/29/2020  . IR PTA VENOUS EXCEPT DIALYSIS CIRCUIT  12/05/2020  . IR US GUIDE VASC ACCESS RIGHT   12/12/2020  . TOOTH EXTRACTION N/A 06/03/2014   Procedure: EXTRACTION WISDOM TEETH;  Surgeon: Gae Bon, DDS;  Location: [facility];  Service: Oral Surgery;  Laterality: N/A;  . TUBAL LIGATION    :   CURRENT MEDS: Current Facility-Administered Medications  Medication Dose Route Frequency Provider Last Rate Last Admin  . 0.9 %  sodium chloride infusion  100 mL Intravenous PRN Barton Dubois, MD      . 0.9 %  sodium chloride infusion  100 mL Intravenous PRN Barton Dubois, MD      . acetaminophen (TYLENOL) tablet 650 mg  650 mg Oral Q6H PRN Damita Lack, MD   650 mg at 01/21/21 2112  . ARIPiprazole (ABILIFY) tablet 5 mg  5 mg Oral Daily Amin, Ankit Chirag, MD   5 mg at 01/22/21 0935  . Chlorhexidine Gluconate Cloth 2 % PADS 6 each  6 each Topical Q0600 Janalee Dane, PA-C   6 each at 01/22/21 0436  . [START ON 01/22/2021] Darbepoetin Alfa (ARANESP) injection 150 mcg  150 mcg Intravenous Q Fri-HD Collins, Samantha G, PA-C      . dextromethorphan-guaiFENesin (MUCINEX DM) 30-600 MG per 12 hr tablet 1 tablet  1 tablet Oral BID PRN Amin, Ankit Chirag, MD      . dextrose 10 % infusion   Intravenous Continuous Lavina Hamman, MD 20 mL/hr at 01/22/21 0941 New Bag at 01/22/21 0941  . hydrocerin (EUCERIN) cream   Topical BID Lavina Hamman, MD   Given at 01/22/21 [phone]  . lipase/protease/amylase (CREON) capsule 36,000 Units  36,000 Units Oral TID Barton Dubois, MD   36,000 Units at 01/22/21 0935  . midodrine (PROAMATINE) tablet 10 mg  10 mg Oral Once per day on Mon Wed Fri Mad[place], Carlos, MD   10 mg at 01/21/21 1036  . phytonadione (VITAMIN K) 2 mg in dextrose 5 % 50 mL IVPB  2 mg Intravenous Once Berle Mull M, MD      . potassium PHOSPHATE 20 mmol in dextrose 5 % 500 mL infusion  20 mmol Intravenous Once Lavina Hamman, MD      . senna-docusate (Senokot-S) tablet 1 tablet  1 tablet Oral QHS PRN Damita Lack, MD          Allergies  Allergen Reactions  . Penicillins   . Sulfa  Antibiotics   :  Family History  Problem Relation Age of Onset  . Colon polyps Father   . Hypertension Father   . Diabetes Father   . Seizures Mother   . Hypertension Mother   . Diabetes Mother   . Diabetes Paternal Grandmother   . Colon cancer Neg Hx   :  Social History   Socioeconomic History  . Marital status: Single    Spouse name: Not on file  . Number of children: 0  . Years of education: Not on file  . Highest education level: Not on file  Occupational History  . Occupation: diabled    Employer: NOT EMPLOYED  Tobacco Use  .  Smoking status: Never Smoker  . Smokeless tobacco: Never Used  Vaping Use  . Vaping Use: Never used  Substance and Sexual Activity  . Alcohol use: No  . Drug use: No  . Sexual activity: Not Currently    Birth control/protection: Surgical    Comment: tubal  Other Topics Concern  . Not on file  Social History Narrative  . Not on file   Social Determinants of Health   Financial Resource Strain: Not on file  Food Insecurity: Not on file  Transportation Needs: Not on file  Physical Activity: Not on file  Stress: Not on file  Social Connections: Not on file  Intimate Partner Violence: Not on file  :  REVIEW OF SYSTEMS: Unable to obtain a comprehensive review of systems due to diminished mental capacity.  Exam: Patient Vitals for the past 24 hrs:  BP Temp Temp src Pulse Resp SpO2  01/22/21 0300 106/66 (!) 97.5 F (36.4 C) Oral 98 18 100 %  01/21/21 2120 (!) 96/53 -- -- -- -- --  01/21/21 2055 (!) 85/51 98.3 F (36.8 C) Oral 100 18 100 %  01/21/21 2054 (!) 85/51 98.3 F (36.8 C) Oral 100 18 100 %    General: Chronically ill-appearing female, no distress Eyes:  no scleral icterus.   ENT: No thrush or mucositis. Lymphatics:  Negative cervical, supraclavicular or axillary adenopathy.   Respiratory: lungs were clear bilaterally without wheezing or crackles.   Cardiovascular:  Regular rate and rhythm, S1/S2, without murmur, rub or  gallop.  There was no pedal edema.   GI:  abdomen was soft, flat, nontender, nondistended, without organomegaly.    Skin: Multiple ecchymoses including a very large ecchymotic area over her right shoulder and down her right arm, petechiae noted to her bilateral arms.  Excoriation noted between her legs and on her buttocks. Neuro exam was nonfocal.  The patient is minimally interactive and unable to fully assess orientation.  LABS:  Lab Results  Component Value Date   WBC 8.7 01/22/2021   HGB 8.1 (L) 01/22/2021   HCT 22.2 (L) 01/22/2021   PLT 30 (L) 01/22/2021   GLUCOSE 72 01/22/2021   CHOL  11/14/2008    174        ATP III CLASSIFICATION:  <200     mg/dL   Desirable  200-239  mg/dL   Borderline High  >=240    mg/dL   High   TRIG 149 09/06/2020   HDL 36 (L) 11/14/2008   LDLCALC (H) 11/14/2008    120        Total Cholesterol/HDL:CHD Risk Coronary Heart Disease Risk Table                     Men   Women  1/2 Average Risk   3.4   3.3   ALT 21 01/22/2021   AST 23 01/22/2021   NA 142 01/22/2021   K 2.9 (L) 01/22/2021   CL 108 01/22/2021   CREATININE 4.33 (H) 01/22/2021   BUN 19 01/22/2021   CO2 23 01/22/2021   INR 1.7 (H) 01/22/2021    IR Fluoro Guide CV Line Right  Result Date: 12/29/2020 INDICATION: 45 year old female with end-stage renal disease and indwelling right internal jugular tunneled hemodialysis catheter presenting with catheter malfunction. EXAM: 1. Right upper extremity central venogram. 2. Intravascular ultrasound. 3. Balloon angioplasty of the SVC and right atrium. 4. Exchange of tunneled hemodialysis catheter. MEDICATIONS: Ancef 2 gm IV . The antibiotic was given  in an appropriate time interval prior to skin puncture. ANESTHESIA/SEDATION: Moderate (conscious) sedation was employed during this procedure. A total of Versed 1 mg and Fentanyl 50 mcg was administered intravenously. Moderate Sedation Time: 52 minutes. The patient's level of consciousness and vital signs  were monitored continuously by radiology nursing throughout the procedure under my direct supervision. FLUOROSCOPY TIME:  Six minutes 36 seconds (20 mGy). CONTRAST:  45 mL Omnipaque 300, intravenous COMPLICATIONS: None immediate. PROCEDURE: Informed written consent was obtained from the patient after a discussion of the risks, benefits, and alternatives to treatment. Questions regarding the procedure were encouraged and answered. The right neck and chest were prepped with chlorhexidine in a sterile fashion, and a sterile drape was applied covering the operative field. Maximum barrier sterile technique with sterile gowns and gloves were used for the procedure. A timeout was performed prior to the initiation of the procedure. The indwelling catheter was unable to aspirate from either of. Through each held, a stiff exchange length Glidewire was inserted and directed to the inferior vena cava. The catheter was removed and a 12 French sheath was placed over 1 of the wires with the tip positioned in the innominate vein. Hand injection of contrast to perform central venogram demonstrated a fibrin sheath surrounding the indwelling catheter extending into the right atrium with there is masslike filling defect compatible with conglomerate fibrin sheath versus chronic thrombus. An 8 [place], 0.035" IVUS catheter was then inserted to evaluate the central veins. This demonstrated the indwelling wire and catheter position within an echogenic fibrin sheath that extended from the innominate vein to the right atrium. There is masslike echogenic conglomerate of fibrin versus chronic appearing thrombus adherent to the wall of the right atrium which occupied approximately 30-50% of the right atrial volume. I this also was used to demarcate the superior and inferior aspects of the right atrium for adequate catheter tip positioning. A 7 mm by 4 cm Conquest balloon was then inserted into the superior aspect of the SVC and efforts to  disrupt the fibrin sheath. The balloon was insufflated under fluoroscopic observation. The I this catheter was then reinserted which demonstrated adequate disruption of the peripheral aspect of the fibrin sheath. The safety Glidewire was removed and reinserted through the sheath and attempt to drive out of the fibrin sheath and into the patent SVC, however this was unsuccessful. Therefore, additional balloon angioplasty was pursued in the right atrium with a 14 mm x 4 cm Atlas balloon. The intravascular ultrasound was then reinserted which showed improved patency of the central aspect of the fibrin sheath. A new tunneled line was then inserted after removal of the sheath measuring 19 cm tip to cuff. The tip was positioned within the right atrium. Final catheter positioning was confirmed and documented with a spot radiographic image. The catheter aspirates and flushes normally. The catheter was flushed with appropriate volume heparin dwells. The catheter exit site was secured with a 0-Silk retention suture. A sterile dressing was applied. The patient tolerated the procedure well without immediate post procedural complication. IMPRESSION: 1. Extensive fibrin sheath extending from the right innominate vein into the right atrium where conglomerate fibrin versus chronic thrombus occupies approximately 30-50% of the right atrial volume as illustrated by venogram and intravascular ultrasound. 2. Fibrin sheath disruption by balloon angioplasty of the SVC (up to 7 mm) and the right atrium (up to 14 mm). 3. Successful exchange of tunneled hemodialysis catheter will with a 19 cm catheter with its tip in the right atrium.  Catheter is ready for immediate use. Ruthann Cancer, MD [facility] Electronically Signed   By: Ruthann Cancer MD   On: 12/29/2020 17:28   DG Chest Port 1 View  Result Date: 12/31/2020 CLINICAL DATA:  Evaluate dialysis catheter EXAM: PORTABLE CHEST 1  VIEW COMPARISON:  11/16/2020 FINDINGS: Lungs are clear.  No pleural effusion or pneumothorax. The heart is normal in size. Right IJ dual lumen dialysis catheter with its distal tip at the cavoatrial junction. IMPRESSION: Right IJ dual lumen dialysis catheter with its distal tip at the cavoatrial junction. Electronically Signed   By: Julian Hy M.D.   On: 12/28/2020 16:51   VAS [place] ABI WITH/WO TBI  Result Date: 01/20/2021 LOWER EXTREMITY DOPPLER STUDY Indications: AVF creation. High Risk Factors: Hypertension.  Limitations: Today's exam was limited due to patient positioning, patient unable              to lie flat, patient intolerant to cuff pressure, involuntary              patient movement, bandages and poor patient cooperation. Comparison Study: No prior studies. Performing Technologist: Carlos Levering RVT  Examination Guidelines: A complete evaluation includes at minimum, Doppler waveform signals and systolic blood pressure reading at the level of bilateral brachial, anterior tibial, and posterior tibial arteries, when vessel segments are accessible. Bilateral testing is considered an integral part of a complete examination. Photoelectric Plethysmograph (PPG) waveforms and toe systolic pressure readings are included as required and additional duplex testing as needed. Limited examinations for reoccurring indications may be performed as noted.  ABI Findings: +--------+------------------+-----+---------+------------------+ Right   Rt Pressure (mmHg)IndexWaveform Comment            +--------+------------------+-----+---------+------------------+ [id number]                    triphasic                   +--------+------------------+-----+---------+------------------+ PTA                                     Unable to insonate +--------+------------------+-----+---------+------------------+ DP                             absent                       +--------+------------------+-----+---------+------------------+ +--------+------------------+-----+--------+------------------+ Left    Lt Pressure (mmHg)IndexWaveformComment            +--------+------------------+-----+--------+------------------+ Brachial                               Unable to insonate +--------+------------------+-----+--------+------------------+ PTA     105               0.93                            +--------+------------------+-----+--------+------------------+ DP      112               0.99                            +--------+------------------+-----+--------+------------------+ +-------+-----------+-----------+------------+------------+ ABI/TBIToday's ABIToday's TBIPrevious ABIPrevious TBI +-------+-----------+-----------+------------+------------+ Left   0.99                                           +-------+-----------+-----------+------------+------------+  Summary: Right: Unable to obtain ABI due to patient positioning, and poor patient cooperation. Left: Resting left ankle-brachial index is within normal range. No evidence of significant left lower extremity arterial disease.  *See table(s) above for measurements and observations.  Electronically signed by Deitra Mayo MD on 01/20/2021 at 4:11:22 PM.    Final    VAS [place] UPPER EXTREMITY ARTERIAL DUPLEX  Result Date: 01/20/2021 UPPER EXTREMITY DUPLEX STUDY Indications: Patient complains of AVF creation.  Risk Factors: Hypertension. Limitations: Patient positioning, poor patient cooperation, patient movement,              patient pain tolerance Comparison Study: 09/05/2020 Performing Technologist: Oliver Hum RVT  Examination Guidelines: A complete evaluation includes B-mode imaging, spectral Doppler, color Doppler, and power Doppler as needed of all accessible portions of each vessel. Bilateral testing is considered an integral part of a complete examination. Limited examinations for  reoccurring indications may be performed as noted.  Right Doppler Findings: +---------------+----------+---------+--------+--------+ Site           PSV (cm/s)Waveform StenosisComments +---------------+----------+---------+--------+--------+ Subclavian Dist          triphasic                 +---------------+----------+---------+--------+--------+ Brachial Dist  74        triphasic        0.32cm   +---------------+----------+---------+--------+--------+ Radial Dist    47        triphasic        0.15cm   +---------------+----------+---------+--------+--------+ Ulnar Dist     31        triphasic        0.12cm   +---------------+----------+---------+--------+--------+   Left Doppler Findings: +-------------+----------+---------+--------+--------+ Site         PSV (cm/s)Waveform StenosisComments +-------------+----------+---------+--------+--------+ Brachial Dist89        triphasic        0.23cm   +-------------+----------+---------+--------+--------+ Radial Dist  47        triphasic        0.17     +-------------+----------+---------+--------+--------+ Ulnar Dist   28        biphasic         0.1cm    +-------------+----------+---------+--------+--------+   Electronically signed by Deitra Mayo MD on 01/20/2021 at 4:12:51 PM.    Final    IR INTRAVASCULAR ULTRASOUND NON CORONARY  Result Date: 12/29/2020 INDICATION: 46 year old female with end-stage renal disease and indwelling right internal jugular tunneled hemodialysis catheter presenting with catheter malfunction. EXAM: 1. Right upper extremity central venogram. 2. Intravascular ultrasound. 3. Balloon angioplasty of the SVC and right atrium. 4. Exchange of tunneled hemodialysis catheter. MEDICATIONS: Ancef 2 gm IV . The antibiotic was given in an appropriate time interval prior to skin puncture. ANESTHESIA/SEDATION: Moderate (conscious) sedation was employed during this procedure. A total of Versed 1 mg and  Fentanyl 50 mcg was administered intravenously. Moderate Sedation Time: 52 minutes. The patient's level of consciousness and vital signs were monitored continuously by radiology nursing throughout the procedure under my direct supervision. FLUOROSCOPY TIME:  Six minutes 36 seconds (20 mGy). CONTRAST:  45 mL Omnipaque 300, intravenous COMPLICATIONS: None immediate. PROCEDURE: Informed written consent was obtained from the patient after a discussion of the risks, benefits, and alternatives to treatment. Questions regarding the procedure were encouraged and answered. The right neck and chest were prepped with chlorhexidine in a sterile fashion, and a sterile drape was applied covering the operative field. Maximum barrier sterile technique with sterile gowns and gloves were used for the  procedure. A timeout was performed prior to the initiation of the procedure. The indwelling catheter was unable to aspirate from either of. Through each held, a stiff exchange length Glidewire was inserted and directed to the inferior vena cava. The catheter was removed and a 12 French sheath was placed over 1 of the wires with the tip positioned in the innominate vein. Hand injection of contrast to perform central venogram demonstrated a fibrin sheath surrounding the indwelling catheter extending into the right atrium with there is masslike filling defect compatible with conglomerate fibrin sheath versus chronic thrombus. An 8 [place], 0.035" IVUS catheter was then inserted to evaluate the central veins. This demonstrated the indwelling wire and catheter position within an echogenic fibrin sheath that extended from the innominate vein to the right atrium. There is masslike echogenic conglomerate of fibrin versus chronic appearing thrombus adherent to the wall of the right atrium which occupied approximately 30-50% of the right atrial volume. I this also was used to demarcate the superior and inferior aspects of the right atrium for  adequate catheter tip positioning. A 7 mm by 4 cm Conquest balloon was then inserted into the superior aspect of the SVC and efforts to disrupt the fibrin sheath. The balloon was insufflated under fluoroscopic observation. The I this catheter was then reinserted which demonstrated adequate disruption of the peripheral aspect of the fibrin sheath. The safety Glidewire was removed and reinserted through the sheath and attempt to drive out of the fibrin sheath and into the patent SVC, however this was unsuccessful. Therefore, additional balloon angioplasty was pursued in the right atrium with a 14 mm x 4 cm Atlas balloon. The intravascular ultrasound was then reinserted which showed improved patency of the central aspect of the fibrin sheath. A new tunneled line was then inserted after removal of the sheath measuring 19 cm tip to cuff. The tip was positioned within the right atrium. Final catheter positioning was confirmed and documented with a spot radiographic image. The catheter aspirates and flushes normally. The catheter was flushed with appropriate volume heparin dwells. The catheter exit site was secured with a 0-Silk retention suture. A sterile dressing was applied. The patient tolerated the procedure well without immediate post procedural complication. IMPRESSION: 1. Extensive fibrin sheath extending from the right innominate vein into the right atrium where conglomerate fibrin versus chronic thrombus occupies approximately 30-50% of the right atrial volume as illustrated by venogram and intravascular ultrasound. 2. Fibrin sheath disruption by balloon angioplasty of the SVC (up to 7 mm) and the right atrium (up to 14 mm). 3. Successful exchange of tunneled hemodialysis catheter will with a 19 cm catheter with its tip in the right atrium. Catheter is ready for immediate use. Ruthann Cancer, MD [facility] Electronically Signed   By: Ruthann Cancer MD   On:  12/29/2020 17:28   VAS [place] UPPER EXT VEIN MAPPING (PRE-OP AVF)  Result Date: 01/20/2021 UPPER EXTREMITY VEIN MAPPING  Indications: Pre-access. Limitations: Patient positioning, poor patient cooperation, patient movement,              patient pain tolerance Comparison Study: No prior studies. Performing Technologist: Oliver Hum RVT  Examination Guidelines: A complete evaluation includes B-mode imaging, spectral Doppler, color Doppler, and power Doppler as needed of all accessible portions of each vessel. Bilateral testing is considered an integral part of a complete examination. Limited examinations for reoccurring indications may be performed as noted. +-----------------+-------------+----------+--------------+ Right Cephalic   Diameter (cm)Depth (cm)  Findings    +-----------------+-------------+----------+--------------+ Shoulder             0.22        1.20                  +-----------------+-------------+----------+--------------+ Prox upper arm       0.23        1.40                  +-----------------+-------------+----------+--------------+ Mid upper arm        0.24        1.60                  +-----------------+-------------+----------+--------------+ Dist upper arm       0.18        1.40                  +-----------------+-------------+----------+--------------+ Antecubital fossa    0.19        0.40                  +-----------------+-------------+----------+--------------+ Prox forearm         0.13        1.00                  +-----------------+-------------+----------+--------------+ Mid forearm          0.09        1.10                  +-----------------+-------------+----------+--------------+ Dist forearm                            not visualized +-----------------+-------------+----------+--------------+ +-----------------+-------------+----------+--------------+ Right Basilic    Diameter (cm)Depth (cm)   Findings     +-----------------+-------------+----------+--------------+ Shoulder             0.50        1.10                  +-----------------+-------------+----------+--------------+ Mid upper arm        0.43        0.98                  +-----------------+-------------+----------+--------------+ Dist upper arm       0.23        1.20     branching    +-----------------+-------------+----------+--------------+ Antecubital fossa    0.20        1.10     branching    +-----------------+-------------+----------+--------------+ Prox forearm         0.10        0.69                  +-----------------+-------------+----------+--------------+ Mid forearm          0.06        0.49                  +-----------------+-------------+----------+--------------+ Distal forearm                          not visualized +-----------------+-------------+----------+--------------+ +-----------------+-------------+----------+--------+ Left Cephalic    Diameter (cm)Depth (cm)Findings +-----------------+-------------+----------+--------+ Shoulder             0.17        1.30            +-----------------+-------------+----------+--------+ Prox upper arm       0.13  1.20            +-----------------+-------------+----------+--------+ Mid upper arm        0.10        1.40            +-----------------+-------------+----------+--------+ Dist upper arm       0.08        0.96            +-----------------+-------------+----------+--------+ Antecubital fossa    0.06        0.52            +-----------------+-------------+----------+--------+ Prox forearm         0.16        0.63            +-----------------+-------------+----------+--------+ Mid forearm          0.15        0.69            +-----------------+-------------+----------+--------+ Dist forearm         0.09        0.33            +-----------------+-------------+----------+--------+  +-----------------+-------------+----------+--------------+ Left Basilic     Diameter (cm)Depth (cm)   Findings    +-----------------+-------------+----------+--------------+ Shoulder             0.22        1.40                  +-----------------+-------------+----------+--------------+ Mid upper arm        0.22        1.30                  +-----------------+-------------+----------+--------------+ Dist upper arm       0.18        1.20                  +-----------------+-------------+----------+--------------+ Antecubital fossa    0.20        0.87     branching    +-----------------+-------------+----------+--------------+ Prox forearm         0.06        0.66                  +-----------------+-------------+----------+--------------+ Mid forearm                             not visualized +-----------------+-------------+----------+--------------+ Distal forearm                          not visualized +-----------------+-------------+----------+--------------+ *See table(s) above for measurements and observations.  Diagnosing physician: Deitra Mayo MD Electronically signed by Deitra Mayo MD on 01/20/2021 at 4:12:32 PM.    Final      ASSESSMENT AND PLAN:  This is a 45 year old female currently admitted due to malfunctioning hemodialysis catheter.  Recent COVID-19 infection but now out of isolation.  The patient had mild thrombocytopenia on admission with significant worsening thrombocytopenia during this admission.  Differentials include TTP, HIT, DIC, ITP, medication induced.  -Peripheral blood smear has been reviewed in conjunction with pathology who did not seen any schistocytes making microangiography less likely. -The patient has received heparin this admission with dialysis.  This was discontinued this morning.  HIT panel was sent and results are pending. Will follow up on this result.  -Will add on DIC panel today. -Medications from this  admission and medications prior to admission have been  reviewed. The patient is on Abilify which can cause thrombocytopenia in rare cases. However, this has been a long-term medication for this patient.  -Recommend monitoring daily CBC with differential and transfuse platelets for platelet count less than 20,000 or active bleeding.  Thank you for this referral.  Mikey Bussing, DNP, AGPCNP-BC, AOCNP Mon/Tues/Thurs/Fri 7am-5pm; Off Wednesdays Cell: [phone]  Attending Note  I personally saw the patient, reviewed the chart and examined the patient. The plan of care was discussed with the patient and the admitting team. I agree with the assessment and plan as documented above. Thank you very much for the consultation. Smear concerning for severe thrombocytopenia, scattered schistocytes Reviewed labs consistent with DIC, cause unclear, ? Related to recent COVID infection Will transfuse cryoprecipitate since fibrinogen is less than 60, BC today, imaging to look for any acute infection since the source of DIC is unclear. Low suspicion for HIT No role for anticoagulation in DIC unless clear DVT or PE Consider transfusion if bleeding  Or if platelets less than 20 K Discussed all the above mentioned findings with Dr Danice Goltz M.D. DIC panel daily, ordered

## 2021-01-22 NOTE — Progress Notes (Signed)
OT Cancellation Note  Patient Details Name: Tamara [name] MRN: [id number] DOB: 05-Jul-1976   Cancelled Treatment:    Reason Eval/Treat Not Completed: Medical issues which prohibited therapy (Pt having multiple bouts of bowel incontinence and had just vomited, being cleaned by NT.) Will continue to follow.   [name] 01/22/2021, 1:47 PM  [name], OTR/L Acute Rehabilitation Services Pager: [phone] Office: [phone]

## 2021-01-22 NOTE — Consult Note (Signed)
Plan for central venogram and catheter tomorrow Thrombocytopenia of unknown origin Will set up platelets for tomorrow but if counts continue to drop may need another temp cath.  Npo p midnight Type and screen Set up platelets  Ruta Hinds, MD [facility] Office: [phone]

## 2021-01-22 NOTE — Progress Notes (Addendum)
Went to check pt, noted Rt femoral HD catheter was pulled out by pt,noted some bleeding, applied pressure.On call B. Kyere,NP made aware via text page.

## 2021-01-22 NOTE — Progress Notes (Signed)
Bedside RN notified of need for LA to be drawn.  Pt is a lab stick, and difficult stick. Lab was able to draw 1 tube for cultures and initial LA, and then pt had platelets ordered and were hung. Pt only has 1 PIV and so antibiotics will be delayed d/t pt's need for platelets.

## 2021-01-22 NOTE — Consult Note (Signed)
   [facility] CM Inpatient Consult   01/22/2021  Tamara [name] 1976/04/18 [id number]   Patient screened for high risk score for unplanned readmission. Chart reviewed to assess for potential [place] [facility] community service needs. Per review, patient from [facility]. No [facility] Care Management follow up needs.   Netta Cedars, MSN, [facility] Liaison Nurse Mobile Phone [phone]  Toll free office [phone]

## 2021-01-23 ENCOUNTER — Encounter (HOSPITAL_COMMUNITY): Admission: EM | Disposition: E | Payer: Self-pay | Source: Home / Self Care | Attending: Internal Medicine

## 2021-01-23 ENCOUNTER — Inpatient Hospital Stay (HOSPITAL_COMMUNITY): Payer: Medicare Other | Admitting: Critical Care Medicine

## 2021-01-23 ENCOUNTER — Encounter (HOSPITAL_COMMUNITY): Payer: Self-pay | Admitting: Internal Medicine

## 2021-01-23 ENCOUNTER — Inpatient Hospital Stay (HOSPITAL_COMMUNITY): Payer: Medicare Other

## 2021-01-23 DIAGNOSIS — N186 End stage renal disease: Secondary | ICD-10-CM | POA: Diagnosis not present

## 2021-01-23 DIAGNOSIS — R579 Shock, unspecified: Secondary | ICD-10-CM

## 2021-01-23 DIAGNOSIS — I959 Hypotension, unspecified: Secondary | ICD-10-CM | POA: Diagnosis not present

## 2021-01-23 DIAGNOSIS — D649 Anemia, unspecified: Secondary | ICD-10-CM | POA: Diagnosis not present

## 2021-01-23 DIAGNOSIS — N185 Chronic kidney disease, stage 5: Secondary | ICD-10-CM | POA: Diagnosis not present

## 2021-01-23 HISTORY — PX: VENOGRAM: SHX5497

## 2021-01-23 HISTORY — PX: INSERTION OF DIALYSIS CATHETER: SHX1324

## 2021-01-23 HISTORY — PX: REMOVAL OF A DIALYSIS CATHETER: SHX6053

## 2021-01-23 LAB — COMPREHENSIVE METABOLIC PANEL
ALT: 33 U/L (ref 0–44)
AST: 41 U/L (ref 15–41)
Albumin: 1.7 g/dL — ABNORMAL LOW (ref 3.5–5.0)
Alkaline Phosphatase: 56 U/L (ref 38–126)
Anion gap: 16 — ABNORMAL HIGH (ref 5–15)
BUN: 21 mg/dL — ABNORMAL HIGH (ref 6–20)
CO2: 17 mmol/L — ABNORMAL LOW (ref 22–32)
Calcium: 7 mg/dL — ABNORMAL LOW (ref 8.9–10.3)
Chloride: 108 mmol/L (ref 98–111)
Creatinine, Ser: 5.26 mg/dL — ABNORMAL HIGH (ref 0.44–1.00)
GFR, Estimated: 10 mL/min — ABNORMAL LOW (ref >60)
Glucose, Bld: 90 mg/dL (ref 70–99)
Potassium: 2.9 mmol/L — ABNORMAL LOW (ref 3.5–5.1)
Sodium: 141 mmol/L (ref 135–145)
Total Bilirubin: 1.9 mg/dL — ABNORMAL HIGH (ref 0.3–1.2)
Total Protein: 3.8 g/dL — ABNORMAL LOW (ref 6.5–8.1)

## 2021-01-23 LAB — LACTIC ACID, PLASMA: Lactic Acid, Venous: 7.6 mmol/L (ref 0.5–1.9)

## 2021-01-23 LAB — FIBRINOGEN
Fibrinogen: 155 mg/dL — ABNORMAL LOW (ref 210–475)
Fibrinogen: 156 mg/dL — ABNORMAL LOW (ref 210–475)

## 2021-01-23 LAB — GLUCOSE, CAPILLARY
Glucose-Capillary: 68 mg/dL — ABNORMAL LOW (ref 70–99)
Glucose-Capillary: 119 mg/dL — ABNORMAL HIGH (ref 70–99)
Glucose-Capillary: 85 mg/dL (ref 70–99)
Glucose-Capillary: 76 mg/dL (ref 70–99)
Glucose-Capillary: 78 mg/dL (ref 70–99)
Glucose-Capillary: 85 mg/dL (ref 70–99)
Glucose-Capillary: 95 mg/dL (ref 70–99)
Glucose-Capillary: 99 mg/dL (ref 70–99)

## 2021-01-23 LAB — BLOOD GAS, VENOUS
Acid-base deficit: 6.5 mmol/L — ABNORMAL HIGH (ref 0.0–2.0)
Bicarbonate: 18.1 mmol/L — ABNORMAL LOW (ref 20.0–28.0)
Drawn by: 4117
FIO2: 100
O2 Saturation: 93.3 %
Patient temperature: 37
pCO2, Ven: 33.6 mmHg — ABNORMAL LOW (ref 44.0–60.0)
pH, Ven: 7.35 (ref 7.250–7.430)
pO2, Ven: 70 mmHg — ABNORMAL HIGH (ref 32.0–45.0)

## 2021-01-23 LAB — CBC
HCT: 20.5 % — ABNORMAL LOW (ref 36.0–46.0)
Hemoglobin: 7 g/dL — ABNORMAL LOW (ref 12.0–15.0)
MCH: 27.8 pg (ref 26.0–34.0)
MCHC: 34.1 g/dL (ref 30.0–36.0)
MCV: 81.3 fL (ref 80.0–100.0)
Platelets: 25 10*3/uL — CL (ref 150–400)
RBC: 2.52 MIL/uL — ABNORMAL LOW (ref 3.87–5.11)
RDW: 17.6 % — ABNORMAL HIGH (ref 11.5–15.5)
WBC: 6.2 10*3/uL (ref 4.0–10.5)
nRBC: 0 % (ref 0.0–0.2)

## 2021-01-23 LAB — PROTIME-INR
INR: 1.2 (ref 0.8–1.2)
Prothrombin Time: 14.9 seconds (ref 11.4–15.2)

## 2021-01-23 LAB — MRSA PCR SCREENING: MRSA by PCR: NEGATIVE

## 2021-01-23 LAB — PREPARE RBC (CROSSMATCH)

## 2021-01-23 LAB — D-DIMER, QUANTITATIVE: D-Dimer, Quant: 0.76 ug/mL-FEU — ABNORMAL HIGH (ref 0.00–0.50)

## 2021-01-23 LAB — PATHOLOGIST SMEAR REVIEW

## 2021-01-23 LAB — PROCALCITONIN: Procalcitonin: 32.93 ng/mL

## 2021-01-23 LAB — HEPARIN INDUCED PLATELET AB (HIT ANTIBODY): Heparin Induced Plt Ab: 0.118 OD (ref 0.000–0.400)

## 2021-01-23 SURGERY — INSERTION OF DIALYSIS CATHETER
Anesthesia: General | Laterality: Right

## 2021-01-23 MED ORDER — DEXTROSE 50 % IV SOLN
12.5000 g | INTRAVENOUS | Status: AC
  Administered 2021-01-23: 12.5 g via INTRAVENOUS

## 2021-01-23 MED ORDER — NOREPINEPHRINE 4 MG/250ML-% IV SOLN
0.0000 ug/min | INTRAVENOUS | Status: DC
  Administered 2021-01-23: 5 ug/min via INTRAVENOUS

## 2021-01-23 MED ORDER — KETAMINE HCL 50 MG/5ML IJ SOSY
PREFILLED_SYRINGE | INTRAMUSCULAR | Status: AC
  Filled 2021-01-23: qty 5

## 2021-01-23 MED ORDER — 0.9 % SODIUM CHLORIDE (POUR BTL) OPTIME
TOPICAL | Status: DC | PRN
  Administered 2021-01-23: 1000 mL

## 2021-01-23 MED ORDER — MIDODRINE HCL 5 MG PO TABS
10.0000 mg | ORAL_TABLET | Freq: Three times a day (TID) | ORAL | Status: DC
  Filled 2021-01-23 (×3): qty 2

## 2021-01-23 MED ORDER — HEPARIN SODIUM (PORCINE) 1000 UNIT/ML IJ SOLN
INTRAMUSCULAR | Status: AC
  Filled 2021-01-23: qty 1

## 2021-01-23 MED ORDER — NOREPINEPHRINE 4 MG/250ML-% IV SOLN
INTRAVENOUS | Status: AC
  Filled 2021-01-23: qty 250

## 2021-01-23 MED ORDER — ALBUMIN HUMAN 5 % IV SOLN
12.5000 g | Freq: Once | INTRAVENOUS | Status: DC
  Filled 2021-01-23: qty 250

## 2021-01-23 MED ORDER — FENTANYL CITRATE (PF) 100 MCG/2ML IJ SOLN
INTRAMUSCULAR | Status: DC | PRN
  Administered 2021-01-23: 50 ug via INTRAVENOUS
  Administered 2021-01-23: 25 ug via INTRAVENOUS

## 2021-01-23 MED ORDER — PROPOFOL 10 MG/ML IV BOLUS
INTRAVENOUS | Status: DC | PRN
  Administered 2021-01-23: 50 mg via INTRAVENOUS
  Administered 2021-01-23: 40 mg via INTRAVENOUS
  Administered 2021-01-23: 110 mg via INTRAVENOUS

## 2021-01-23 MED ORDER — SODIUM CHLORIDE 0.9% IV SOLUTION: Freq: Once | INTRAVENOUS | Status: DC

## 2021-01-23 MED ORDER — SODIUM CHLORIDE 0.9 % IV SOLN: 250.0000 mL | INTRAVENOUS | Status: DC

## 2021-01-23 MED ORDER — SODIUM CHLORIDE 0.9 % IV SOLN
INTRAVENOUS | Status: DC | PRN
  Administered 2021-01-23: 500 mL

## 2021-01-23 MED ORDER — DEXTROSE 50 % IV SOLN
INTRAVENOUS | Status: AC
  Filled 2021-01-23: qty 50

## 2021-01-23 MED ORDER — ALBUMIN HUMAN 25 % IV SOLN
25.0000 g | Freq: Once | INTRAVENOUS | Status: DC
  Filled 2021-01-23 (×2): qty 100

## 2021-01-23 MED ORDER — MIDAZOLAM HCL 2 MG/2ML IJ SOLN
INTRAMUSCULAR | Status: AC
  Filled 2021-01-23: qty 2

## 2021-01-23 MED ORDER — SODIUM CHLORIDE 0.9 % IV SOLN
INTRAVENOUS | Status: AC
  Filled 2021-01-23: qty 1.2

## 2021-01-23 MED ORDER — KETAMINE HCL 10 MG/ML IJ SOLN
INTRAMUSCULAR | Status: DC | PRN
  Administered 2021-01-23: 20 mg via INTRAVENOUS

## 2021-01-23 MED ORDER — DEXMEDETOMIDINE HCL IN NACL 400 MCG/100ML IV SOLN: 0.4000 ug/kg/h | INTRAVENOUS | Status: DC

## 2021-01-23 MED ORDER — LIDOCAINE 2% (20 MG/ML) 5 ML SYRINGE
INTRAMUSCULAR | Status: DC | PRN
  Administered 2021-01-23: 100 mg via INTRAVENOUS

## 2021-01-23 MED ORDER — IODIXANOL 320 MG/ML IV SOLN
INTRAVENOUS | Status: DC | PRN
  Administered 2021-01-23: 105 mL via INTRAVENOUS

## 2021-01-23 MED ORDER — METRONIDAZOLE IN NACL 5-0.79 MG/ML-% IV SOLN
500.0000 mg | Freq: Three times a day (TID) | INTRAVENOUS | Status: DC
  Administered 2021-01-23 – 2021-01-27 (×14): 500 mg via INTRAVENOUS
  Filled 2021-01-23 (×14): qty 100

## 2021-01-23 MED ORDER — PHENYLEPHRINE HCL-NACL 10-0.9 MG/250ML-% IV SOLN
INTRAVENOUS | Status: DC | PRN
  Administered 2021-01-23: 40 ug/min via INTRAVENOUS

## 2021-01-23 MED ORDER — MIDAZOLAM HCL 2 MG/2ML IJ SOLN: 1.0000 mg | Freq: Once | INTRAMUSCULAR | Status: AC

## 2021-01-23 MED ORDER — SODIUM CHLORIDE 0.9 % IV SOLN
1.0000 g | INTRAVENOUS | Status: DC
  Administered 2021-01-23 – 2021-01-26 (×4): 1 g via INTRAVENOUS
  Filled 2021-01-23 (×6): qty 1

## 2021-01-23 MED ORDER — VANCOMYCIN HCL 500 MG/100ML IV SOLN
500.0000 mg | Freq: Once | INTRAVENOUS | Status: AC
  Administered 2021-01-23: 500 mg via INTRAVENOUS
  Filled 2021-01-23: qty 100

## 2021-01-23 MED ORDER — PROPOFOL 10 MG/ML IV BOLUS
INTRAVENOUS | Status: AC
  Filled 2021-01-23: qty 40

## 2021-01-23 MED ORDER — POTASSIUM CHLORIDE 10 MEQ/50ML IV SOLN
10.0000 meq | INTRAVENOUS | Status: AC
  Administered 2021-01-23 (×2): 10 meq via INTRAVENOUS
  Filled 2021-01-23 (×2): qty 50

## 2021-01-23 MED ORDER — CHLORHEXIDINE GLUCONATE 0.12 % MT SOLN
15.0000 mL | Freq: Two times a day (BID) | OROMUCOSAL | Status: DC
  Administered 2021-01-23 – 2021-01-28 (×8): 15 mL via OROMUCOSAL
  Filled 2021-01-23 (×3): qty 15

## 2021-01-23 MED ORDER — VANCOMYCIN HCL 500 MG/100ML IV SOLN
500.0000 mg | Freq: Once | INTRAVENOUS | Status: DC
  Filled 2021-01-23: qty 100

## 2021-01-23 MED ORDER — PHENYLEPHRINE 40 MCG/ML (10ML) SYRINGE FOR IV PUSH (FOR BLOOD PRESSURE SUPPORT)
PREFILLED_SYRINGE | INTRAVENOUS | Status: DC | PRN
  Administered 2021-01-23: 120 ug via INTRAVENOUS
  Administered 2021-01-23: 80 ug via INTRAVENOUS

## 2021-01-23 MED ORDER — SODIUM CHLORIDE 0.9 % IV BOLUS
250.0000 mL | Freq: Once | INTRAVENOUS | Status: AC
  Administered 2021-01-23: 250 mL via INTRAVENOUS

## 2021-01-23 MED ORDER — SODIUM CHLORIDE 0.9 % IV BOLUS: 1000.0000 mL | Freq: Once | INTRAVENOUS | Status: DC

## 2021-01-23 MED ORDER — MIDAZOLAM HCL 2 MG/2ML IJ SOLN
INTRAMUSCULAR | Status: AC
  Administered 2021-01-23: 1 mg via INTRAVENOUS
  Filled 2021-01-23: qty 4

## 2021-01-23 MED ORDER — FENTANYL CITRATE (PF) 250 MCG/5ML IJ SOLN
INTRAMUSCULAR | Status: AC
  Filled 2021-01-23: qty 5

## 2021-01-23 MED ORDER — ORAL CARE MOUTH RINSE
15.0000 mL | Freq: Two times a day (BID) | OROMUCOSAL | Status: DC
  Administered 2021-01-26 – 2021-01-28 (×5): 15 mL via OROMUCOSAL

## 2021-01-23 MED ORDER — MIDAZOLAM HCL 5 MG/5ML IJ SOLN
INTRAMUSCULAR | Status: DC | PRN
  Administered 2021-01-23: 2 mg via INTRAVENOUS

## 2021-01-23 MED ORDER — HEPARIN SODIUM (PORCINE) 1000 UNIT/ML IJ SOLN
INTRAMUSCULAR | Status: DC | PRN
  Administered 2021-01-23: 6200 [IU] via INTRAVENOUS

## 2021-01-23 SURGICAL SUPPLY — 61 items
ADH SKN CLS APL DERMABOND .7 (GAUZE/BANDAGES/DRESSINGS) ×2
APL PRP STRL LF DISP 70% ISPRP (MISCELLANEOUS) ×4
BAG BANDED W/RUBBER/TAPE 36X54 (MISCELLANEOUS) ×3 IMPLANT
BAG DECANTER FOR FLEXI CONT (MISCELLANEOUS) ×2 IMPLANT
BAG EQP BAND 135X91 W/RBR TAPE (MISCELLANEOUS) ×2
BAG SNAP BAND KOVER 36X36 (MISCELLANEOUS) ×3 IMPLANT
BIOPATCH RED 1 DISK 7.0 (GAUZE/BANDAGES/DRESSINGS) ×3 IMPLANT
CANISTER SUCT 3000ML PPV (MISCELLANEOUS) ×2 IMPLANT
CATH PALINDROME-P 19CM W/VT (CATHETERS) IMPLANT
CATH PALINDROME-P 23CM W/VT (CATHETERS) IMPLANT
CATH PALINDROME-P 28CM W/VT (CATHETERS) IMPLANT
CATH STRAIGHT 5FR 65CM (CATHETERS) IMPLANT
CHLORAPREP W/TINT 26 (MISCELLANEOUS) ×4 IMPLANT
COVER BACK TABLE 60X90IN (DRAPES) ×3 IMPLANT
COVER DOME SNAP 22 D (MISCELLANEOUS) ×3 IMPLANT
COVER PROBE W GEL 5X96 (DRAPES) ×3 IMPLANT
COVER SURGICAL LIGHT HANDLE (MISCELLANEOUS) ×3 IMPLANT
COVER WAND RF STERILE (DRAPES) ×2 IMPLANT
DECANTER SPIKE VIAL GLASS SM (MISCELLANEOUS) IMPLANT
DERMABOND ADVANCED (GAUZE/BANDAGES/DRESSINGS) ×1
DERMABOND ADVANCED .7 DNX12 (GAUZE/BANDAGES/DRESSINGS) IMPLANT
DRAPE C-ARM 42X72 X-RAY (DRAPES) ×2 IMPLANT
DRAPE CHEST BREAST 15X10 FENES (DRAPES) ×3 IMPLANT
DRAPE FEMORAL ANGIO 80X135IN (DRAPES) ×2 IMPLANT
DRSG COVADERM 4X6 (GAUZE/BANDAGES/DRESSINGS) ×1 IMPLANT
DRSG PAD ABDOMINAL 8X10 ST (GAUZE/BANDAGES/DRESSINGS) ×1 IMPLANT
ELECT REM PT RETURN 9FT ADLT (ELECTROSURGICAL)
ELECTRODE REM PT RTRN 9FT ADLT (ELECTROSURGICAL) IMPLANT
GAUZE 4X4 16PLY RFD (DISPOSABLE) ×2 IMPLANT
GLOVE BIO SURGEON STRL SZ7.5 (GLOVE) ×3 IMPLANT
GOWN STRL REUS W/ TWL LRG LVL3 (GOWN DISPOSABLE) ×6 IMPLANT
GOWN STRL REUS W/TWL LRG LVL3 (GOWN DISPOSABLE) ×9
KIT BASIN OR (CUSTOM PROCEDURE TRAY) ×3 IMPLANT
KIT PALINDROME-P 55CM (CATHETERS) ×1 IMPLANT
KIT TURNOVER KIT B (KITS) ×3 IMPLANT
NDL 18GX1X1/2 (RX/OR ONLY) (NEEDLE) ×2 IMPLANT
NDL HYPO 25GX1X1/2 BEV (NEEDLE) ×2 IMPLANT
NEEDLE 18GX1X1/2 (RX/OR ONLY) (NEEDLE) ×6 IMPLANT
NEEDLE HYPO 25GX1X1/2 BEV (NEEDLE) ×3 IMPLANT
NS IRRIG 1000ML POUR BTL (IV SOLUTION) ×3 IMPLANT
PACK SURGICAL SETUP 50X90 (CUSTOM PROCEDURE TRAY) ×3 IMPLANT
PAD ARMBOARD 7.5X6 YLW CONV (MISCELLANEOUS) ×6 IMPLANT
PROTECTION STATION PRESSURIZED (MISCELLANEOUS)
SET MICROPUNCTURE 5F STIFF (MISCELLANEOUS) ×1 IMPLANT
SHEATH PINNACLE 5F 10CM (SHEATH) ×3 IMPLANT
STATION PROTECTION PRESSURIZED (MISCELLANEOUS) ×2 IMPLANT
STOPCOCK MORSE 400PSI 3WAY (MISCELLANEOUS) ×2 IMPLANT
SUT ETHILON 3 0 PS 1 (SUTURE) ×3 IMPLANT
SUT VICRYL 4-0 PS2 18IN ABS (SUTURE) ×3 IMPLANT
SYR 10ML LL (SYRINGE) ×10 IMPLANT
SYR 20ML LL LF (SYRINGE) ×8 IMPLANT
SYR 5ML LL (SYRINGE) ×2 IMPLANT
SYR CONTROL 10ML LL (SYRINGE) ×2 IMPLANT
TOWEL GREEN STERILE (TOWEL DISPOSABLE) ×6 IMPLANT
TOWEL GREEN STERILE FF (TOWEL DISPOSABLE) ×3 IMPLANT
TRAY FOLEY MTR SLVR 16FR STAT (SET/KITS/TRAYS/PACK) IMPLANT
TUBING CIL FLEX 10 FLL-RA (TUBING) ×2 IMPLANT
UNDERPAD 30X36 HEAVY ABSORB (UNDERPADS AND DIAPERS) IMPLANT
WATER STERILE IRR 1000ML POUR (IV SOLUTION) ×3 IMPLANT
WIRE AMPLATZ SS-J .035X180CM (WIRE) ×1 IMPLANT
WIRE BENTSON .035X145CM (WIRE) ×1 IMPLANT

## 2021-01-23 NOTE — Op Note (Addendum)
Procedure: 1.  Bilateral central venogram  2.  Ultrasound right groin insertion of right femoral palindrome catheter 55 cm  3.  Removal of right internal jugular vein dialysis catheter  Preoperative diagnosis: End-stage renal disease  Postoperative diagnosis: Same  Anesthesia: General  Assistant: Nurse  Operative findings: 1.  Bilateral patent central veins  2.  55 cm palindrome catheter right femoral vein with tip in right atrium  Operative details: After obtaining informed consent from the patient's guardian, patient was taken the operating.  The patient was placed in supine position operating table.  After induction of general anesthesia and placement of a laryngeal mask patient's left forearm IV was accessed and under fluoroscopy a contrast angiogram was obtained.  This showed a patent axillary subclavian and innominate vein on the left side.  There was some reflux of contrast up into the proximal right internal jugular vein.  The innominate vein was not completely well opacified but did appear patent.  I was unable to see any significant narrowing.  At this point the IV in the right hand was accessed and a similar contrast angiogram was performed.  These were all done with hand injections.  The right axillary vein subclavian vein and innominate vein is patent.  There is flow of contrast into the superior vena cava.  There is a dialysis catheter within the superior vena cava.  I did not detect any significant narrowing on the right side.  Since the patient's platelet count was less than 50 I elected not to place a additional catheter into the upper central circulation especially in light of the fact that the current right internal jugular vein catheter was nonfunctioning I did not think changing this over a wire would improve her current situation.  At this point sterile prep and drape was performed of both groins and anterior thighs.  Ultrasound was used to identify the right common  femoral vein.  This had normal compressibility and respiratory variation.  Micropuncture needle was used to cannulate the right common femoral vein and the micropuncture wire advanced into the right femoral system.  The dilator and wire was then removed and an 035 J-wire advanced all the way up to the right atrium.  Micropuncture sheath was placed over this.  Sequential dilation was then performed with a 5 French sheath followed by 12 14 and 16 French dilators with peel-away sheath of a 55 cm antegrade palindrome catheter.  An additional incision was placed on the anterior thigh and the catheter tunneled subcutaneously up to the insertion site and placed through the peel-away sheath up into the right atrium.  Initially the tip of the catheter was slightly kinked and with some rotation and pulling back I was able to unkink this and make sure that the tip was in the mid right atrium.  The catheter was sutured to the skin and the cuff of the catheter was underneath the skin about 1 cm.  The catheter was noted to flush and draw easily.  Sterile dressing was applied to the catheter.  Attention was then turned to the patient's right internal jugular vein tunneled catheter.  The stitch on the skin was removed and the catheter removed completely with gentle traction.  Hemostasis was obtained with direct pressure for 20 minutes.  The skin exit site was closed with a nylon stitch.  The patient tolerated procedure well and there were no complications.  The instrument sponge and needle count was correct end of the case.  Patient was taken the  recovery room in stable condition.  Operative management: The patient appears to have patent central veins in her upper extremities.  She would be a candidate for an upper arm AV graft at some point when she is more medically stable.  If she requires further tunneled dialysis catheters we could most likely return to the jugular veins once her platelet count is improved.  Ruta Hinds, MD [facility] Office: [phone]  Attempted to call pt [place].  [place], MD [facility] [place] Office: [phone]

## 2021-01-23 NOTE — Transfer of Care (Signed)
Immediate Anesthesia Transfer of Care Note  Patient: Tamara [name]  Procedure(s) Performed: INSERTION OF RIGHT FEMORAL TUNNELED DIALYSIS CATHETER (Right ) REMOVAL OF RIGHT CHEST TUNNELED DIALYSIS CATHETER (Right ) BILATERAL CENTRAL VENOGRAM (Bilateral )  Patient Location: PACU  Anesthesia Type:General  Level of Consciousness: drowsy  Airway & Oxygen Therapy: Patient Spontanous Breathing and Patient connected to nasal cannula oxygen  Post-op Assessment: Report given to RN, Post -op Vital signs reviewed and stable and Patient moving all extremities  Post vital signs: Reviewed and stable  Last Vitals:  Vitals Value Taken Time  BP 134/72 01/25/2021 1454  Temp    Pulse 83 01/17/2021 1455  Resp 11 01/11/2021 1457  SpO2 100 % 01/14/2021 1455  Vitals shown include unvalidated device data.  Last Pain:  Vitals:   01/20/2021 1122  TempSrc: Oral  PainSc:       Patients Stated Pain Goal: 0 (94/50/38 [id number])  Complications: No complications documented.

## 2021-01-23 NOTE — Progress Notes (Signed)
Called to give report to [facility]

## 2021-01-23 NOTE — Consult Note (Signed)
NAME:  Tamara [name], MRN:  [id number], DOB:  [birth year], LOS: 5 ADMISSION DATE:  01/01/2021, CONSULTATION DATE: 01/03/2021 REFERRING MD: [facility], CHIEF COMPLAINT: Hypotension  Brief History:  45 year old female with a plethora of health issues including end-stage renal disease bacteremia hypotension and was admitted for further evaluation and treatment and replacement of hemodialysis catheter.  History of Present Illness:  45 year old female who is developmentally challenged and has a past medical history of Covid in 2000 21 February and recently 01/13/2020.  She is end-stage renal disease her hemodialysis catheter right tunneled became nonfunctional.  She was noted to be Covid positive at that time and was transferred to the hospital for further evaluation and treatment.  Femoral hemodialysis catheter placed by interventional radiology for which she subsequently removed herself.  The past medical history is consistent with end-stage renal disease with nonfunctioning hemodialysis catheter in right history of Covid with development ARDS.  She developed increasing hypotension on 01/22/2021 proved refractory to treatment pulmonary critical care was called 12/29/2020 for further evaluation and treatment.  She was scheduled to go to the operating room today 01/11/2021 for placement of new catheter.  Since her fibrinogen is less than 60 with elevated INR seemed appropriate for the OR to replace her hemodialysis catheter rather than placing another temporary dialysis catheter.  She may need CRRT.  Following operation to be transferred to intensive care unit for further evaluation and treatment.       Past Medical History:   Past Medical History:  Diagnosis Date  . Abnormal uterine bleeding (AUB) 01/26/2016  . Acute pancreatitis without necrosis or infection, unspecified   . Anemia in chronic kidney disease (CKD)   . Anxiety and depression    mentally slow  . Cardiac arrhythmia   . Chronic abdominal pain    . COVID-19   . Dependence on renal dialysis ([place])   . Depression   . Gastro-esophageal reflux disease without esophagitis   . Generalized anxiety disorder   . Gout   . Hematuria 01/26/2016  . Hyperlipidemia   . Hypertension   . Hypomagnesemia   . Leg pain   . Major depressive disorder   . Obesity   . Ovarian cyst 02/10/2016  . Personal history of sudden cardiac arrest   . Pseudocyst of pancreas   . PUD (peptic ulcer disease)   . Pure hypercholesterolemia   . Reflux   . Renal disorder   . S/P colonoscopy 03/15/11   friable anal canal  . Sebaceous cyst of labia 01/26/2016  . Tachycardia   . Unspecified psychosis not due to a substance or known physiological condition ([place])   . Yeast vaginitis 09/19/2013    Significant Hospital Events:  Significant hypertension 01/24/2021 with elevated procalcitonin lactic acid elevated presumed bacteremia sepsis  Consults:  Neuro critical care Renal Medical oncology hematology  Procedures:    Significant Diagnostic Tests:    Micro Data:  01/22/2021 blood cultures x2  Antimicrobials:   12/28/2020 cefepime>> 12/27/2020 Flagyl>> 01/22/2021 vancomycin>> Interim History / Subjective:  45 year old developmentally challenged female with decreased level of consciousness and hypotension in the setting of suspected bacteremia.  Transfer to intensive care unit following OR placement of new hemodialysis catheter.  Objective   Blood pressure (!) 87/49, pulse 97, temperature (!) 97.4 F (36.3 C), temperature source Oral, resp. rate 16, height 5\' 5"  (1.651 m), weight 67.2 kg, SpO2 92 %.        Intake/Output Summary (Last 24 hours) at 01/03/2021 1030 Last data filed at  01/25/2021 0335 Gross per 24 hour  Intake 874.7 ml  Output -  Net 874.7 ml   Filed Weights   01/21/21 0145 01/22/21 2323 01/25/2021 0622  Weight: 66.4 kg 67.6 kg 67.2 kg    Examination: General: Obese female who is lethargic HENT: No JVD lymphadenopathy is  appreciated Lungs: Decreased breath sounds throughout Cardiovascular: Heart sounds are regular Abdomen: Obese soft positive bowel sounds Extremities: Right upper extremities with ecchymosis swelling, lower extremities with muscular wasting Neuro: Difficult to arouse, does orientate with strong verbal motivation GU: Annular  Resolved Hospital Problem list     Assessment & Plan:  Hypotension in the setting of end-stage renal disease, suspected bacteremia from clogged hemodialysis catheter.  Questionable component of sepsis with elevated procalcitonin and lactic acid. Transferred to intensive care 25 g of albumin x1 Gentle fluid hydration Peripheral Levophed drip as needed to keep systolic blood pressure greater than 90 Broad-spectrum antibiotics for presumed bacteremia  End-stage renal disease with nonfunctioning right IJ tunneled catheter. Femoral catheter was pulled out by patient. She is to go to the OR 01/16/2021 for placement of hemodialysis tunnel catheter by vein vascular specialist. Per renal Surgical line placement per vein and vascular surgeon Potassium is low therefore no urgent dialysis She may need CRRT due to hypotension  Questionable component of DIC fibrinogen levels less than 60 Appreciate input from medical oncology Continue to monitor Care with invasive procedure  Presumed bacteremia Vancomycin cefepime Culture is able  Developmentally challenged Require sitter close observation  Best practice (evaluated daily)  Diet: npo Pain/Anxiety/Delirium protocol (if indicated):ams VAP protocol (if indicated): na DVT prophylaxis: na GI prophylaxis: ppi Glucose control: ssi Mobility: Bedrest Disposition:Tx to icu 1/28  Goals of Care:  Last date of multidisciplinary goals of care discussion: Family and staff present:  Summary of discussion:  Follow up goals of care discussion due: per primary Code Status: full  Labs   CBC: Recent Labs  Lab 12/27/2020 1732  01/17/21 0500 01/17/21 1327 01/18/21 0738 01/20/21 0338 01/20/21 1925 01/21/21 0400 01/22/21 0813 01/22/21 1600  WBC 8.2   < > 9.1 12.1* 10.2  --  11.9* 8.7  --   NEUTROABS 4.0  --  7.2  --   --   --   --  5.4  --   HGB 6.0*   < > 6.3* 9.8* 7.1* 9.4* 9.2* 8.1*  --   HCT 18.0*   < > 18.9* 28.2* 20.8* 26.2* 25.8* 22.2*  --   MCV 84.1   < > 82.9 81.3 80.3  --  79.1* 79.0*  --   PLT 134*   < > 121* 120* 52*  --  39* 30* 31*   < > = values in this interval not displayed.    Basic Metabolic Panel: Recent Labs  Lab 01/18/21 0738 01/19/21 0247 01/20/21 0338 01/21/21 0400 01/22/21 0813  NA 136 135 139 138 142  K 3.0* 3.0* 2.7* 3.9 2.9*  CL 98 100 103 104 108  CO2 22 23 25 23 23   GLUCOSE 61* 60* 86 75 72  BUN 12 12 19 13 19   CREATININE 3.43* 3.85* 4.68* 3.14* 4.33*  CALCIUM 7.5* 7.0* 7.1* 6.9* 7.0*  MG  --   --  1.4* 1.5* 2.0  PHOS 1.9* 2.2*  --   --  1.8*   GFR: Estimated Creatinine Clearance: 15.1 mL/min (A) (by C-G formula based on SCr of 4.33 mg/dL (H)). Recent Labs  Lab 01/18/21 [phone] 01/20/21 0338 01/21/21 0400 01/22/21 0813 01/22/21  2126  PROCALCITON  --   --   --   --  31.65  WBC 12.1* 10.2 11.9* 8.7  --   LATICACIDVEN  --   --   --   --  6.0*    Liver Function Tests: Recent Labs  Lab 01/18/21 0738 01/19/21 0247 01/22/21 0813  AST  --   --  23  ALT  --   --  21  ALKPHOS  --   --  61  BILITOT  --   --  1.8*  PROT  --   --  3.7*  ALBUMIN 2.3* 1.8* 1.7*   Recent Labs  Lab 01/22/21 2126  LIPASE 15   Recent Labs  Lab 01/22/21 1306  AMMONIA 23    ABG    Component Value Date/Time   TCO2 24 12/29/2020 1223     Coagulation Profile: Recent Labs  Lab 01/22/21 0813 01/22/21 1600  INR 1.7* 1.6*    Cardiac Enzymes: No results for input(s): CKTOTAL, CKMB, CKMBINDEX, TROPONINI in the last 168 hours.  HbA1C: No results found for: HGBA1C  CBG: Recent Labs  Lab 01/22/21 2009 12/28/2020 0128 01/12/2021 0203 01/09/2021 0414 01/09/2021 0845   GLUCAP 111* 68* 119* 85 76    Review of Systems:   na  Past Medical History:  She,  has a past medical history of Abnormal uterine bleeding (AUB) (01/26/2016), Acute pancreatitis without necrosis or infection, unspecified, Anemia in chronic kidney disease (CKD), Anxiety and depression, Cardiac arrhythmia, Chronic abdominal pain, COVID-19, Dependence on renal dialysis ([place]), Depression, Gastro-esophageal reflux disease without esophagitis, Generalized anxiety disorder, Gout, Hematuria (01/26/2016), Hyperlipidemia, Hypertension, Hypomagnesemia, Leg pain, Major depressive disorder, Obesity, Ovarian cyst (02/10/2016), Personal history of sudden cardiac arrest, Pseudocyst of pancreas, PUD (peptic ulcer disease), Pure hypercholesterolemia, Reflux, Renal disorder, S/P colonoscopy (03/15/11), Sebaceous cyst of labia (01/26/2016), Tachycardia, Unspecified psychosis not due to a substance or known physiological condition ([place]), and Yeast vaginitis (09/19/2013).   Surgical History:   Past Surgical History:  Procedure Laterality Date  . IR FLUORO GUIDE CV LINE RIGHT  12/05/2020  . IR FLUORO GUIDE CV LINE RIGHT  12/12/2020  . IR FLUORO GUIDE CV LINE RIGHT  12/29/2020  . IR INTRAVASCULAR ULTRASOUND NON CORONARY  12/29/2020  . IR PTA VENOUS ADDL EXCEPT DIALYSIS CIRCUIT  12/29/2020  . IR PTA VENOUS EXCEPT DIALYSIS CIRCUIT  12/05/2020  . IR US GUIDE VASC ACCESS RIGHT  12/12/2020  . TOOTH EXTRACTION N/A 06/03/2014   Procedure: EXTRACTION WISDOM TEETH;  Surgeon: Gae Bon, DDS;  Location: [facility];  Service: Oral Surgery;  Laterality: N/A;  . TUBAL LIGATION       Social History:   reports that she has never smoked. She has never used smokeless tobacco. She reports that she does not drink alcohol and does not use drugs.   Family History:  Her family history includes Colon polyps in her father; Diabetes in her father, mother, and paternal grandmother; Hypertension in her father and mother; Seizures in her mother.  There is no history of Colon cancer.   Allergies Allergies  Allergen Reactions  . Penicillins     Tolerated cefazolin and ceftriaxone in past  . Sulfa Antibiotics      Home Medications  Prior to Admission medications   Medication Sig Start Date End Date Taking? Authorizing Provider  allopurinol (ZYLOPRIM) 300 MG tablet Take 300 mg by mouth daily.  09/05/13  Yes [provider]  ARIPiprazole (ABILIFY) 5 MG tablet Take 5 mg by mouth  daily.   Yes [provider]  dicyclomine (BENTYL) 10 MG capsule Take 10 mg by mouth 4 (four) times daily -  before meals and at bedtime.   Yes [provider]  hydrOXYzine (VISTARIL) 25 MG capsule Take 25 mg by mouth 2 (two) times daily.   Yes [provider]  omeprazole (PRILOSEC) 20 MG capsule Take 20 mg by mouth daily.   Yes [provider]  ondansetron (ZOFRAN) 4 MG tablet Take 4 mg by mouth every 8 (eight) hours as needed. 12/19/20  Yes [provider]  Pancrelipase, Lip-Prot-Amyl, (CREON) 24000-76000 units CPEP Take 2 capsules by mouth in the morning, at noon, in the evening, and at bedtime.   Yes [provider]  polyethylene glycol powder (GLYCOLAX/MIRALAX) 17 GM/SCOOP powder Take 0.5 Containers by mouth.   Yes [provider]  sertraline (ZOLOFT) 100 MG tablet Take 100 mg by mouth daily. 01/15/21  Yes [provider]  acetaminophen (TYLENOL) 325 MG tablet Take 650 mg by mouth 4 (four) times daily as needed for moderate pain.     [provider]  ALPRAZolam Duanne Moron) 0.5 MG tablet Take 0.5 mg by mouth 3 (three) times daily as needed. Anxiety    [provider]  amLODIPine Besy-Benazepril HCl (LOTREL PO) Take 1 capsule by mouth daily. Patient not taking: No sig reported    [provider]  cetirizine (ZYRTEC) 10 MG tablet Take 10 mg by mouth daily.    [provider]  chlorthalidone (HYGROTON) 25 MG tablet Take 25 mg by mouth daily.     [provider]  cloNIDine (CATAPRES) 0.2 MG tablet Take 0.2 mg by mouth 3 (three) times daily.    [provider]  COLCRYS 0.6 MG tablet Take 0.6 mg by mouth 2 (two) times daily.  09/15/13   [provider]  colestipol (COLESTID) 1 g tablet Take 1 g by mouth in the morning, at noon, and at bedtime.    [provider]  diclofenac Sodium (VOLTAREN) 1 % GEL Apply topically. 10/23/20   [provider]  doxycycline (VIBRAMYCIN) 100 MG capsule Take 100 mg by mouth 2 (two) times daily. 11/19/20   [provider]  DULoxetine (CYMBALTA) 60 MG capsule Take 60 mg by mouth at bedtime.    [provider]  epoetin alfa (EPOGEN) 4000 UNIT/ML injection Inject into the skin.    [provider]  furosemide (LASIX) 40 MG tablet Take 40 mg by mouth daily.    [provider]  HYDROcodone-acetaminophen (NORCO/VICODIN) 5-325 MG per tablet Take 1 tablet by mouth 4 (four) times daily.  12/13/14   [provider]  lactulose (CHRONULAC) 10 GM/15ML solution Take 20 g by mouth at bedtime.    [provider]  medroxyPROGESTERone (PROVERA) 10 MG tablet TAKE 1 TABLET ONCE DAILY FOR 2 WEEKS. REPEAT EVERY 3 MONTHS Patient taking differently: Take 10 mg by mouth See admin instructions. Take 1 tablet for 2 weeks, repeat every 3 months 12/23/19   [name], MD  Melatonin CR 3 MG TBCR Take by mouth.    [provider]  metoCLOPramide (REGLAN) 5 MG tablet Take 5 mg by mouth 3 (three) times daily as needed for nausea or vomiting.  09/03/20   [provider]  metoprolol (LOPRESSOR) 100 MG tablet Take 100 mg by mouth 2 (two) times daily.  11/29/14   [provider]  metroNIDAZOLE (FLAGYL) 500 MG tablet Take 500 mg by mouth 2 (two) times daily.  11/19/20   [provider]  midodrine (PROAMATINE) 10 MG tablet Take 10 mg by mouth 3 (three) times a week.    [provider]  mirtazapine (REMERON) 15  MG tablet Take 15 mg by mouth at bedtime. 08/27/20   [provider]  ondansetron (ZOFRAN-ODT) 4 MG disintegrating tablet Take 4 mg by mouth every 8 (eight) hours as needed for nausea or vomiting. Patient not taking: No sig reported    [provider]  oxyCODONE-acetaminophen (PERCOCET/ROXICET) 5-325 MG tablet Take 2 tablets by mouth every 6 (six) hours as needed for moderate pain.  09/02/20   [provider]     Critical care time: 25 min    Richardson Landry Minor ACNP Acute Care Nurse Practitioner [place] Please consult Amion 01/08/2021, 10:31 AM

## 2021-01-23 NOTE — Progress Notes (Signed)
Notified provider of need to order repeat lactic acid. ° °

## 2021-01-23 NOTE — Progress Notes (Signed)
[facility] Progress Note Patient Name: Tamara [name] DOB: January 13, 1976 MRN: [id number]   Date of Service  12/28/2020  HPI/Events of Note  K+ 2.9, Creatinine 5.26, GFR 10. ESRD-DD, It's not clear when her next dialysis is scheduled to occur.  eICU Interventions  KCL 20 meq iv x 1 followed by BMP check.        Kerry Kass Aqua Denslow 01/13/2021, 8:16 PM

## 2021-01-23 NOTE — Anesthesia Postprocedure Evaluation (Signed)
Anesthesia Post Note  Patient: Tamara [name] Men  Procedure(s) Performed: INSERTION OF RIGHT FEMORAL TUNNELED DIALYSIS CATHETER (Right ) REMOVAL OF RIGHT CHEST TUNNELED DIALYSIS CATHETER (Right ) BILATERAL CENTRAL VENOGRAM (Bilateral )     Patient location during evaluation: PACU Anesthesia Type: General Level of consciousness: awake and alert Pain management: pain level controlled Vital Signs Assessment: post-procedure vital signs reviewed and stable Respiratory status: spontaneous breathing, nonlabored ventilation, respiratory function stable and patient connected to nasal cannula oxygen Cardiovascular status: blood pressure returned to baseline and stable Postop Assessment: no apparent nausea or vomiting Anesthetic complications: no   No complications documented.  Last Vitals:  Vitals:   01/15/2021 1537 01/14/2021 1539  BP: (!) 83/47 (!) 83/48  Pulse: 80 78  Resp: 16 15  Temp:  (!) 36.2 C  SpO2: 100% 100%    Last Pain:  Vitals:   01/18/2021 1454  TempSrc:   PainSc: [place]

## 2021-01-23 NOTE — Progress Notes (Addendum)
Informed patient's legal Guardian Tamara [name] that patient has been transferred from [facility] to [facility].   Celestia Khat

## 2021-01-23 NOTE — Progress Notes (Addendum)
Lacretia Nicks, RN from [facility] about patient's lactic acid and blood cultures collection status. Phlebotomy, had attempted to collect labs right after shift change with no success, They advised patient is a difficult stick and that they will have another phlebotomist come to attempt collection again. 8:52 pm, phlebotomist was at the bedside attempting to collect labs. 1 tube for cultures and lactic acid were collected and patient had platelet at bedside ready to be transfused.Patient had only one peripheral IV with platelets hunged for transfusion. Another IV placed at this time after blood cultures collection, will proceed to start antibiotics. Will continue to monitor.   Aldine Chakraborty,RN.

## 2021-01-23 NOTE — Progress Notes (Signed)
Notified Dr. Cyd Silence that only 1 tube for cultures and initial lactic acid have been collected on patient. Advised to proceed with antibiotics. Will continue to monitor.   Shanyiah Conde,RN.

## 2021-01-23 NOTE — Anesthesia Procedure Notes (Signed)
Procedure Name: LMA Insertion Date/Time: 01/22/2021 12:54 PM Performed by: Trinna Post., CRNA Pre-anesthesia Checklist: Patient identified, Emergency Drugs available, Suction available, Patient being monitored and Timeout performed Patient Re-evaluated:Patient Re-evaluated prior to induction Oxygen Delivery Method: Circle system utilized Preoxygenation: Pre-oxygenation with 100% oxygen Induction Type: IV induction LMA: LMA inserted LMA Size: 4.0 Number of attempts: 1 Placement Confirmation: positive ETCO2 and breath sounds checked- equal and bilateral Tube secured with: Tape Dental Injury: Teeth and Oropharynx as per pre-operative assessment

## 2021-01-23 NOTE — Care Management Important Message (Signed)
Important Message  Patient Details  Name: Tamara [name] MRN: [id number] Date of Birth: [birth year]   Medicare Important Message Given:  Yes Brianna NT gave pt. their IM letter.      Holli Humbles Smith 01/18/2021, 12:57 PM

## 2021-01-23 NOTE — Progress Notes (Signed)
[facility] Progress Note  Patient: Tamara [name]    OFB:[id number]  DOA: 12/29/2020     Date of Service: the patient was seen and examined on 12/27/2020  Brief hospital course: 45 year old with history of ESRD HD, developmental delay/diminished mental capacity, HTN, COVID-19 with ARDS February 2021, depression, anxiety admitted for malfunction of hemodialysis catheter initially at [facility] was unsuccessful therefore general surgery placed nontunneled catheter and patient was transferred to [facility] for vascular surgery evaluation. Also incidental finding of COVID-19 infection, largely asymptomatic. Vascular surgery, nephrology as well as hematology consulted. Currently PCCM consulted due to worsening hemodynamics. Currently plan is transfer to ICU, care per PCCM.  Assessment and Plan: HD catheter malfunction. Failed TPA. General surgery at [facility] placed temporary HD catheter. Patient was transferred to [facility] for vascular evaluation. Patient pulled out temp HD catheter on 01/21/2021. Vascular surgery right groin temp HD catheter placement on 1/28. TDC and AV graft recommended in future when platelets are stable.  Severe thrombocytopenia. DIC Anemia of chronic kidney disease with acute component. Hematology consulted due to ongoing worsening platelets. HIT panel sent. Work-up currently supports possible diagnosis of DIC given low fibrinogen level and scattered schistocytes. Received cryoprecipitate. No role of anticoagulation for now  currently no evidence of thrombosis. Transfuse for hemoglobin less than 7. No evidence of active bleeding.  H&H relatively stable.  Continue to monitor.  Sepsis. Shock/hypotension. Given current diagnosis of DIC patient was empirically started on IV cefepime on 01/22/2021. Cultures were so far negative. Lactic acid was elevated to 6. Repeat lactic acid were not able to be obtained due to poor  IV access despite multiple phlebotomy attempt. Early in the morning blood pressure started dropping as well. Patient was given IV fluid bolus as well as IV albumin bolus. Midodrine was changed to 3 times daily. PCCM was consulted for a line placement as well as IV access for labs. MRSA PCR was negative. Flagyl and vancomycin was added as well. Patient currently transferred to ICU for further care and started on IV pressors peripherally.  ESRD on HD Nephrology currently following. Receiving hemodialysis via temporary catheter. Currently missed HD Although no indication right now given normal volume and stable electrolytes. May need CRRT given hypotension.  Hypokalemia, hypomagnesemia, hypophosphatemia. Currently being replaced. Monitor.  Hypoglycemia From poor oral intake.  Continue to monitor. On D10 drip. Monitor CBG every 4 hours.  Essential hypertension. Chronic hypotension. On midodrine 3 times a week. Will ncrease the frequency to 3 times daily. Currently not on any antihypertensive medication  COVID-19 infection, asymptomatic Initially tested positive-01/06/2021. Isolation discontinued Continue supportive care.  Saturating well on room air.  Chronic pancreatitis Intractable nausea and vomiting. On Creon.  Hold Recently hospitalized for acute pancreatitis. Currently remains NPO. Lipase level unremarkable. X-ray abdomen negative for any acute intra-abdominal pathology. CT chest abdomen pelvis ordered.  Hypothyroidism, currently sick euthyroid syndrome Free T4 elevated, TSH also elevated.  Currently no therapy.  Recheck in 4 weeks.  Acute metabolic encephalopathy Progressively confused and lethargic. CT head unremarkable. Likely consistent with DIC. Although etiology still not clear. Discontinue psychotropic medication. Monitor on telemetry.  Depression. On Zoloft at home. Currently on hold. Monitor.  Bilateral upper extremity bruising. Etiology  not clear. Monitor for now.  Elevated INR. 1 dose of IV vitamin K.  Goals of care conversation. Patient has a legal guardian with [place]. Any procedures or any intervention that requires a consent DHS must be notified.  Diet:  N.p.o. due to mental status DVT Prophylaxis:   SCDs Start: 01/17/21 0001    Advance goals of care discussion: Full code  Family Communication: no family was present at bedside, at the time of interview.  Discussed with patient's legal guardian on the phone. Opportunity was given to ask question and all questions were answered satisfactorily.   Disposition:  Status is: Inpatient  Remains inpatient appropriate because:Hemodynamically unstable   Dispo: The patient is from: Home              Anticipated d/c is to: to be determined              Anticipated d/c date is: > 3 days              Patient currently is not medically stable to d/c.   Difficult to place patient No  Subjective: Patient remains confused and lethargic.  Able to follow commands but unable to answer questions appropriately.  No nausea no vomiting.  No abdominal pain.  No acute events overnight.  Required a sitter overnight.  Physical Exam:  General: Appear in moderate distress, bilateral erythema of her upper extremity, no rash; Oral Mucosa Clear, dry. no Abnormal Neck Mass Or lumps, Conjunctiva normal  Cardiovascular: S1 and S2 Present, no Murmur, Respiratory: good respiratory effort, Bilateral Air entry present and faint Crackles, no wheezes Abdomen: Bowel Sound present, Soft and no tenderness Extremities: trace Pedal edema Neurology: lethargic and not oriented to time, place, and person affect flat in affect. no new focal deficit Gait not checked due to patient safety concerns  Vitals:   01/15/2021 1830 01/09/2021 1845 01/22/2021 1900 01/02/2021 1915  BP: (!) 144/78 129/74 136/75 128/77  Pulse: 78 72 71 71  Resp: 14 14 14 19   Temp:      TempSrc:      SpO2: 100% 100% 100% 96%  Weight:       Height:        Intake/Output Summary (Last 24 hours) at 01/21/2021 1926 Last data filed at 01/04/2021 1900 Gross per 24 hour  Intake 1726.56 ml  Output 30 ml  Net 1696.56 ml   Filed Weights   01/21/21 0145 01/22/21 2323 01/20/2021 0622  Weight: 66.4 kg 67.6 kg 67.2 kg    Data Reviewed: I have personally reviewed and interpreted daily labs, tele strips, imaging. I reviewed all nursing notes, pharmacy notes, vitals, pertinent old records I have discussed plan of care as described above with RN and patient/family.  CBC: Recent Labs  Lab 01/17/21 1327 01/18/21 0738 01/20/21 0338 01/20/21 1925 01/21/21 0400 01/22/21 0813 01/22/21 1600 01/22/2021 1314  WBC 9.1 12.1* 10.2  --  11.9* 8.7  --  6.2  NEUTROABS 7.2  --   --   --   --  5.4  --   --   HGB 6.3* 9.8* 7.1* 9.4* 9.2* 8.1*  --  7.0*  HCT 18.9* 28.2* 20.8* 26.2* 25.8* 22.2*  --  20.5*  MCV 82.9 81.3 80.3  --  79.1* 79.0*  --  81.3  PLT 121* 120* 52*  --  39* 30* 31* 25*   Basic Metabolic Panel: Recent Labs  Lab 01/18/21 0738 01/19/21 0247 01/20/21 0338 01/21/21 0400 01/22/21 0813 01/09/2021 1735  NA 136 135 139 138 142 141  K 3.0* 3.0* 2.7* 3.9 2.9* 2.9*  CL 98 100 103 104 108 108  CO2 22 23 25 23 23  17*  GLUCOSE 61* 60* 86 75 72 90  BUN 12 12  19 13 19  21*  CREATININE 3.43* 3.85* 4.68* 3.14* 4.33* 5.26*  CALCIUM 7.5* 7.0* 7.1* 6.9* 7.0* 7.0*  MG  --   --  1.4* 1.5* 2.0  --   PHOS 1.9* 2.2*  --   --  1.8*  --     Studies: DG Chest Port 1 View  Result Date: 12/30/2020 CLINICAL DATA:  Dialysis catheter placement EXAM: PORTABLE CHEST 1 VIEW COMPARISON:  01/15/2021 FINDINGS: Interval removal of the right internal jugular dialysis catheter and placement of dialysis catheter from below. This passes through the right atrium and into the superior vena cava or right innominate vein. Lungs are clear. Heart is normal size. No effusions. IMPRESSION: Interval placement of dialysis catheter from lower extremity which passes  through the right heart and is located in the upper SVC or right innominate vein. Electronically Signed   By: Rolm Baptise M.D.   On: 01/19/2021 15:34   DG Abd Portable 1V  Result Date: 01/22/2021 CLINICAL DATA:  45 year old female with nausea vomiting. EXAM: PORTABLE ABDOMEN - 1 VIEW COMPARISON:  CT abdomen pelvis dated 09/05/2020. FINDINGS: No bowel dilatation or evidence of obstruction. No free air or radiopaque calculi. The osseous structures and soft tissues are unremarkable. IMPRESSION: Negative. Electronically Signed   By: Anner Crete M.D.   On: 01/22/2021 19:32   HYBRID OR IMAGING ([facility] ONLY)  Result Date: 01/09/2021 There is no interpretation for this exam.  This order is for images obtained during a surgical procedure.  Please See "Surgeries" Tab for more information regarding the procedure.    Scheduled Meds: . sodium chloride   Intravenous Once  . sodium chloride   Intravenous Once  . sodium chloride   Intravenous Once  . Chlorhexidine Gluconate Cloth  6 each Topical Q0600  . darbepoetin (ARANESP) injection - DIALYSIS  150 mcg Intravenous Q Fri-HD  . hydrocerin   Topical BID  . midodrine  10 mg Oral TID   Continuous Infusions: . sodium chloride    . sodium chloride    . sodium chloride    . albumin human    . ceFEPime (MAXIPIME) IV    . dexmedetomidine (PRECEDEX) IV infusion    . dextrose 20 mL/hr at 01/16/2021 1900  . famotidine (PEPCID) IV 250 mL/hr at 01/01/2021 1015  . metronidazole 500 mg (01/17/2021 1922)  . norepinephrine    . norepinephrine (LEVOPHED) Adult infusion 3 mcg/min (01/19/2021 1900)   PRN Meds: sodium chloride, sodium chloride, acetaminophen, dextromethorphan-guaiFENesin, ondansetron (ZOFRAN) IV  Time spent: 35 minutes  Author: Berle Mull, MD [facility] 01/01/2021   To reach On-call, see care teams to locate the attending and reach out via [email or web address]. Between 7PM-7AM, please contact night-coverage If you still have difficulty reaching  the attending provider, please page the [facility] (Director on Call) for [facility] on amion for assistance.

## 2021-01-23 NOTE — Progress Notes (Signed)
Pt is in procedure and will retry at another time.    01/15/2021 1400  PT Visit Information  Reason Eval/Treat Not Completed Patient at procedure or test/unavailable   Mee Hives, PT MS Acute Rehab Dept. Number: [place] [facility] [place]

## 2021-01-23 NOTE — Progress Notes (Signed)
Renal Navigator received secure message from patient's outpatient HD clinic/[facility] that they cannot take a patient with a femoral catheter. Navigator confirmed that this is a Dance movement psychotherapist. Navigator updated Nephrologist/Dr. Jonnie Finner. Navigator will follow.  Alphonzo Cruise, [place] Renal Navigator [phone]

## 2021-01-23 NOTE — Progress Notes (Signed)
Pt had some shock post procedure and is in ICU now.  Labs, CXR , O2 requirement are okay, no indication for urgent HD, we have cancelled HD plans for tonight. Will reassess tomorrow.   Kelly Splinter, MD 01/01/2021, 4:31 PM

## 2021-01-23 NOTE — Progress Notes (Addendum)
Pharmacy Antibiotic Note  Tamara [name] is a 45 y.o. female admitted on 01/10/2021 with DIC and possible sepsis.  Pharmacy has been consulted for cefepime/vancomycin dosing. Patient is on dialysis.  Had temp HD catheter removed on 01/21/2021 - now plan for [facility] placement today. Will plan for HD after. No documented uop. WBC 8.7, LA 6, CRP 2.5, PCT 31.65.   Received 1g IV vancomycin on 1/27@2256 . Will give an additional dose today to make total loading dose of 1500 mg.  Plan: Will order vancomycin 500 mg IV once for total of 1500 mg loading dose - will order 750 mg IV with HD  Cefepime 1 g q24 hr given plan for continued HD  Monitor cultures, clinical status  Narrow abx as able and f/u duration    Height: 5\' 5"  (165.1 cm) Weight: 67.2 kg (148 lb 2.4 oz) IBW/kg (Calculated) : 57  Temp (24hrs), Avg:97.9 F (36.6 C), Min:97.4 F (36.3 C), Max:98.5 F (36.9 C)  Recent Labs  Lab 01/17/21 1327 01/18/21 0738 01/19/21 0247 01/20/21 0338 01/21/21 0400 01/22/21 0813 01/22/21 2126  WBC 9.1 12.1*  --  10.2 11.9* 8.7  --   CREATININE  --  3.43* 3.85* 4.68* 3.14* 4.33*  --   LATICACIDVEN  --   --   --   --   --   --  6.0*    Estimated Creatinine Clearance: 15.1 mL/min (A) (by C-G formula based on SCr of 4.33 mg/dL (H)).    Allergies  Allergen Reactions  . Penicillins     Tolerated cefazolin and ceftriaxone in past  . Sulfa Antibiotics     Antimicrobials this admission: Cefe 1/27  >>  Vanc 1/27 >>   Microbiology results: 1/27 BCx: pend 1/27 UCx: pend  1/27 sputum: pend 1/27 MRSA PCR: neg  Thank you for allowing pharmacy to be a part of this patient's care.  Antonietta Jewel, PharmD, [place] Clinical Pharmacist  Phone: [phone] 01/16/2021 8:23 AM  Please check AMION for all [place] phone numbers After 10:00 PM, call [place] [phone]

## 2021-01-23 NOTE — Interval H&P Note (Signed)
History and Physical Interval Note:  01/11/2021 12:11 PM  Tamara [name] Men  has presented today for surgery, with the diagnosis of ESRD.  The various methods of treatment have been discussed with the patient and family. After consideration of risks, benefits and other options for treatment, the patient has consented to  Procedure(s): INSERTION OF TUNNELED DIALYSIS CATHETER (N/A) REMOVAL OF A TUNNELED DIALYSIS CATHETER (N/A) BILATERAL CENTRAL VENOGRAM (Bilateral) as a surgical intervention.  The patient's history has been reviewed, patient examined, no change in status, stable for surgery.  I have reviewed the patient's chart and labs.  Questions were answered to the patient's satisfaction.    Patient with thrombocytopenia unknown etiology thought to be primarily sepsis related.  She is combative and we are unable to do any procedures on her without the assistance of general anesthesia.  She needs bilateral central venogram for access planning purposes.  This would not require her platelet count to be normalized.  She should probably also have her tunneled dialysis catheter removed since it is not functioning and certainly could be a nidus of sepsis.  Her temporary femoral catheter is currently functioning and we will consider whether or not to swap this out for a tunneled catheter based on her central venogram findings.  However, with her thrombocytopenia most likely we will leave this catheter in place for now as long as it is continuing to function.  Ruta Hinds, MD [facility] Office: [place]

## 2021-01-23 NOTE — Progress Notes (Signed)
Tamara [name]   DOB:1976-10-22   EQ#:[id number]   WLN#:[id number]   Assessment and Plan  This is a 45 year old female currently admitted due to malfunctioning hemodialysis catheter now found to have DIC of unclear etiology. She received cryo yesterday since fibrinogen is 60. DIC panel today with fibrinogen of 155, no indication for cryo or FFP today. Coagulopathy is better, INR normal. Blood cx drawn to identify any potential infection. Systemic imaging ordered to look for any potential cause of DIC since patient is not at capacity to give any good history. We will review labs and imaging as they are available. Platelet count of 25 K today, no bleeding noted. Please consider obtaining DIC panel daily for monitoring  [name], MD 01/16/2021  3:54 PM   Subjective: She had her dialysis catheter placed today No oozing from the lines. She is unable to tell anything, sleepy from the procedure  Objective:   Vitals:   01/26/2021 1537 01/03/2021 1539  BP: (!) 83/47 (!) 83/48  Pulse: 80 78  Resp: 16 15  Temp:    SpO2: 100% 100%    Body mass index is 24.65 kg/m.  Intake/Output Summary (Last 24 hours) at 01/16/2021 1554 Last data filed at 01/01/2021 1450 Gross per 24 hour  Intake 1124.7 ml  Output 30 ml  Net 1094.7 ml     Sclerae unicteric  Lungs clear -- no rales or rhonchi  Heart regular rate and rhythm  Abdomen benign  Skin: Diffuse ecchymosis on the right shoulder is now clearing. No new ecchymosis noted. No oozing from line BLE swelling stable to improved.  CBG (last 3)  Recent Labs    01/11/2021 0203 01/08/2021 0414 01/22/2021 0845  GLUCAP 119* 85 76     Labs:  Lab Results  Component Value Date   WBC 6.2 01/07/2021   HGB 7.0 (L) 01/04/2021   HCT 20.5 (L) 01/12/2021   MCV 81.3 01/24/2021   PLT 25 (LL) 01/18/2021   NEUTROABS 5.4 01/22/2021    @LASTCHEMISTRY @  Urine Studies No results for input(s): UHGB, CRYS in the last 72 hours.  Invalid input(s): UACOL,  UAPR, USPG, UPH, UTP, UGL, UKET, UBIL, UNIT, UROB, [place], UEPI, UWBC, [place], [place]  Basic Metabolic Panel: Recent Labs  Lab 01/18/21 [id number] 01/19/21 0247 01/20/21 0338 01/21/21 0400 01/22/21 0813  NA 136 135 139 138 142  K 3.0* 3.0* 2.7* 3.9 2.9*  CL 98 100 103 104 108  CO2 22 23 25 23 23   GLUCOSE 61* 60* 86 75 72  BUN 12 12 19 13 19   CREATININE 3.43* 3.85* 4.68* 3.14* 4.33*  CALCIUM 7.5* 7.0* 7.1* 6.9* 7.0*  MG  --   --  1.4* 1.5* 2.0  PHOS 1.9* 2.2*  --   --  1.8*   GFR Estimated Creatinine Clearance: 15.1 mL/min (A) (by C-G formula based on SCr of 4.33 mg/dL (H)). Liver Function Tests: Recent Labs  Lab 01/18/21 0738 01/19/21 0247 01/22/21 0813  AST  --   --  23  ALT  --   --  21  ALKPHOS  --   --  61  BILITOT  --   --  1.8*  PROT  --   --  3.7*  ALBUMIN 2.3* 1.8* 1.7*   Recent Labs  Lab 01/22/21 2126  LIPASE 15   Recent Labs  Lab 01/22/21 1306  AMMONIA 23   Coagulation profile Recent Labs  Lab 01/22/21 0813 01/22/21 1600 01/11/2021 1314  INR 1.7* 1.6* 1.2  CBC: Recent Labs  Lab 01/09/2021 1732 01/17/21 0500 01/17/21 1327 01/18/21 0738 01/20/21 0338 01/20/21 1925 01/21/21 0400 01/22/21 0813 01/22/21 1600 01/08/2021 1314  WBC 8.2   < > 9.1 12.1* 10.2  --  11.9* 8.7  --  6.2  NEUTROABS 4.0  --  7.2  --   --   --   --  5.4  --   --   HGB 6.0*   < > 6.3* 9.8* 7.1* 9.4* 9.2* 8.1*  --  7.0*  HCT 18.0*   < > 18.9* 28.2* 20.8* 26.2* 25.8* 22.2*  --  20.5*  MCV 84.1   < > 82.9 81.3 80.3  --  79.1* 79.0*  --  81.3  PLT 134*   < > 121* 120* 52*  --  39* 30* 31* 25*   < > = values in this interval not displayed.   Cardiac Enzymes: No results for input(s): CKTOTAL, CKMB, CKMBINDEX, TROPONINI in the last 168 hours. BNP: Invalid input(s): POCBNP CBG: Recent Labs  Lab 01/22/21 2009 01/01/2021 0128 01/15/2021 0203 01/14/2021 0414 01/22/2021 0845  GLUCAP 111* 68* 119* 85 76   D-Dimer Recent Labs    01/22/21 1600 01/01/2021 1343  DDIMER  0.85* 0.76*   Hgb A1c No results for input(s): HGBA1C in the last 72 hours. Lipid Profile No results for input(s): CHOL, HDL, LDLCALC, TRIG, CHOLHDL, LDLDIRECT in the last 72 hours. Thyroid function studies Recent Labs    01/22/21 1306  TSH 11.685*   Anemia work up Recent Labs    01/21/21 0400 01/22/21 0813 01/22/21 1344  VITAMINB12  --   --  578  FERRITIN 1,277* 1,123*  --   RETICCTPCT  --  1.3  --    Microbiology Recent Results (from the past 240 hour(s))  SARS Coronavirus 2 by RT PCR (hospital order, performed in [facility] hospital lab) Nasopharyngeal Nasopharyngeal Swab     Status: Abnormal   Collection Time: 01/04/2021  8:29 PM   Specimen: Nasopharyngeal Swab  Result Value Ref Range Status   SARS Coronavirus 2 POSITIVE (A) NEGATIVE Final    Comment: RESULT CALLED TO, READ BACK BY AND VERIFIED WITH: C. TURNER 1/21 @ 2232 BY S. BEARD. (NOTE) SARS-CoV-2 target nucleic acids are DETECTED  SARS-CoV-2 RNA is generally detectable in upper respiratory specimens  during the acute phase of infection.  Positive results are indicative  of the presence of the identified virus, but do not rule out bacterial infection or co-infection with other pathogens not detected by the test.  Clinical correlation with patient history and  other diagnostic information is necessary to determine patient infection status.  The expected result is negative.  Fact Sheet for Patients:   StrictlyIdeas.no   Fact Sheet for Healthcare Providers:   BankingDealers.co.za    This test is not yet approved or cleared by the [place] FDA and  has been authorized for detection and/or diagnosis of SARS-CoV-2 by FDA under an Emergency Use Authorization (EUA).  This EUA will remain in effect (meaning this te st can be used) for the duration of  the COVID-19 declaration under Section 564(b)(1) of the Act, 21 U.S.C. section 360-bbb-3(b)(1), unless the  authorization is terminated or revoked sooner.  Performed at [facility], [address], [place], [place] [address]   MRSA PCR Screening     Status: None   Collection Time: 01/22/21  9:48 PM   Specimen: Nasopharyngeal  Result Value Ref Range Status   MRSA by PCR NEGATIVE NEGATIVE Final  Comment:        The GeneXpert MRSA Assay (FDA approved for NASAL specimens only), is one component of a comprehensive MRSA colonization surveillance program. It is not intended to diagnose MRSA infection nor to guide or monitor treatment for MRSA infections. Performed at [facility], [place] [address], [place],  [address]       Studies:  CT HEAD WO CONTRAST  Result Date: 01/22/2021 CLINICAL DATA:  Mental status change, unknown cause EXAM: CT HEAD WITHOUT CONTRAST TECHNIQUE: Contiguous axial images were obtained from the base of the skull through the vertex without intravenous contrast. COMPARISON:  None. FINDINGS: Brain: No acute infarct or intracranial hemorrhage. No mass lesion. No midline shift, ventriculomegaly or extra-axial fluid collection. Vascular: No hyperdense vessel or unexpected calcification. Skull: Negative for fracture or focal lesion. Sinuses/Orbits: Normal orbits. Right maxillary sinus mucous retention cyst. No mastoid effusion. Other: None. IMPRESSION: No acute intracranial process. Electronically Signed   By: Primitivo Gauze M.D.   On: 01/22/2021 14:17   DG Chest Port 1 View  Result Date: 01/16/2021 CLINICAL DATA:  Dialysis catheter placement EXAM: PORTABLE CHEST 1 VIEW COMPARISON:  01/18/2021 FINDINGS: Interval removal of the right internal jugular dialysis catheter and placement of dialysis catheter from below. This passes through the right atrium and into the superior vena cava or right innominate vein. Lungs are clear. Heart is normal size. No effusions. IMPRESSION: Interval placement of dialysis catheter from lower extremity which passes through the right  heart and is located in the upper SVC or right innominate vein. Electronically Signed   By: Rolm Baptise M.D.   On: 12/31/2020 15:34   DG Abd Portable 1V  Result Date: 01/22/2021 CLINICAL DATA:  45 year old female with nausea vomiting. EXAM: PORTABLE ABDOMEN - 1 VIEW COMPARISON:  CT abdomen pelvis dated 09/05/2020. FINDINGS: No bowel dilatation or evidence of obstruction. No free air or radiopaque calculi. The osseous structures and soft tissues are unremarkable. IMPRESSION: Negative. Electronically Signed   By: Anner Crete M.D.   On: 01/22/2021 19:32   HYBRID OR IMAGING ([facility] ONLY)  Result Date: 01/22/2021 There is no interpretation for this exam.  This order is for images obtained during a surgical procedure.  Please See "Surgeries" Tab for more information regarding the procedure.

## 2021-01-23 NOTE — Progress Notes (Addendum)
CRITICAL VALUE ALERT  Critical Value: Lactic acid - 6  Date & Time Notied: 01/22/21 @ 2241   Provider Notified: Kennon Holter, NP notified  Orders Received/Actions taken: No new orders.

## 2021-01-23 NOTE — Progress Notes (Addendum)
Hypoglycemic Event  CBG: 68  Treatment: Dextrose 50% IV 12.5 g  Symptoms: None  Follow-up CBG:  Time: 0251 CBG Result: 119  Possible Reasons for Event: NPO  Comments/MD notified: N/A    Suzan Garibaldi

## 2021-01-23 NOTE — Progress Notes (Signed)
   01/17/2021 0137  Assess: MEWS Score  Temp 98 F (36.7 C)  BP 101/66  Pulse Rate (!) 115  ECG Heart Rate (!) 116  Resp 16  Level of Consciousness Alert  SpO2 92 %  O2 Device Room Air  Patient Activity (if Appropriate) In bed  Assess: MEWS Score  MEWS Temp 0  MEWS Systolic 0  MEWS Pulse 2  MEWS RR 0  MEWS LOC 0  MEWS Score 2  MEWS Score Color Yellow  Assess: if the MEWS score is Yellow or Red  Were vital signs taken at a resting state? Yes  Focused Assessment No change from prior assessment  Early Detection of Sepsis Score *See Row Information* Medium  MEWS guidelines implemented *See Row Information* No, previously yellow, continue vital signs every 4 hours  Treat  MEWS Interventions Administered scheduled meds/treatments  Escalate  MEWS: Escalate Yellow: discuss with charge nurse/RN and consider discussing with provider and RRT  Notify: Charge Nurse/RN  Name of Charge Nurse/RN Notified Jequetta RN  Date Charge Nurse/RN Notified 01/09/2021  Time Charge Nurse/RN Notified 0138  Document  Patient Outcome Other (Comment) (RN remains at bedside)  Progress note created (see row info) Yes

## 2021-01-23 NOTE — Procedures (Signed)
Central Venous Catheter Insertion Procedure Note  Tamara [name]  [id number]  May 05, 1976  Date:01/03/2021  Time:5:47 PM   Provider Performing:Shanikia Kernodle B Margert Edsall   Procedure: Insertion of Non-tunneled Central Venous Catheter(36556) with US guidance (07680)   Indication(s) Medication administration and Difficult access  Consent Unable to obtain consent due to emergent nature of procedure.  Anesthesia Topical only with 1% lidocaine   Timeout Verified patient identification, verified procedure, site/side was marked, verified correct patient position, special equipment/implants available, medications/allergies/relevant history reviewed, required imaging and test results available.  Sterile Technique Maximal sterile technique including full sterile barrier drape, hand hygiene, sterile gown, sterile gloves, mask, hair covering, sterile ultrasound probe cover (if used).  Procedure Description Area of catheter insertion was cleaned with chlorhexidine and draped in sterile fashion.  With real-time ultrasound guidance a central venous catheter was placed into the left femoral vein. Nonpulsatile blood flow and easy flushing noted in all ports.  The catheter was sutured in place and sterile dressing applied.  Complications/Tolerance None; patient tolerated the procedure well. Chest X-ray is ordered to verify placement for internal jugular or subclavian cannulation.   Chest x-ray is not ordered for femoral cannulation.  EBL Minimal  Specimen(s) None

## 2021-01-23 NOTE — Progress Notes (Signed)
There was a question that came up about the patient not being on precautions. Patient is no longer on airborne/contact precautions per MD clinical documentation. IP has been notified and agrees with this since pt originally was positive on 01/06/21 per documentation.

## 2021-01-23 NOTE — Progress Notes (Signed)
CBG 78. 

## 2021-01-23 NOTE — Progress Notes (Addendum)
[name] Progress Note   Subjective: Seen in room, oriented to person only. Now with safety sitter at bedside. No C/Os. Denies SOB. Labs ordered, not done yet. Plans to go to OR today per Dr. Oneida Alar. May get temp catheter again if platelets fall further.  SBP in 80s-100s since MN.   Objective Vitals:   01/08/2021 0033 12/27/2020 0056 01/10/2021 0137 01/08/2021 0622  BP: (!) 82/57 (!) 84/45 101/66 (!) 87/49  Pulse: (!) 109 (!) 104 (!) 115 97  Resp:  18 16 13   Temp: 97.9 F (36.6 C) 98.5 F (36.9 C) 98 F (36.7 C) (!) 97.4 F (36.3 C)  TempSrc: Oral Oral Oral Oral  SpO2: 98% 100% 92% 92%  Weight:    67.2 kg  Height:       Physical Exam General:Chronically ill appearing female, looks older than stated age, in NAD Heart: S1,S2 No M/R/G SR on monitor Lungs: CTAB decreased in bases Abdomen: Soft, active BS Extremities: Trace BLE edema Dialysis Access: RIJ [facility]     Additional Objective Labs: Basic Metabolic Panel: Recent Labs  Lab 01/18/21 0738 01/19/21 0247 01/20/21 0338 01/21/21 0400 01/22/21 0813  NA 136 135 139 138 142  K 3.0* 3.0* 2.7* 3.9 2.9*  CL 98 100 103 104 108  CO2 22 23 25 23 23   GLUCOSE 61* 60* 86 75 72  BUN 12 12 19 13 19   CREATININE 3.43* 3.85* 4.68* 3.14* 4.33*  CALCIUM 7.5* 7.0* 7.1* 6.9* 7.0*  PHOS 1.9* 2.2*  --   --  1.8*   Liver Function Tests: Recent Labs  Lab 01/18/21 0738 01/19/21 0247 01/22/21 0813  AST  --   --  23  ALT  --   --  21  ALKPHOS  --   --  61  BILITOT  --   --  1.8*  PROT  --   --  3.7*  ALBUMIN 2.3* 1.8* 1.7*   Recent Labs  Lab 01/22/21 2126  LIPASE 15   CBC: Recent Labs  Lab 01/06/2021 1732 01/17/21 0500 01/17/21 1327 01/18/21 0738 01/20/21 0338 01/20/21 1925 01/21/21 0400 01/22/21 0813 01/22/21 1600  WBC 8.2   < > 9.1 12.1* 10.2  --  11.9* 8.7  --   NEUTROABS 4.0  --  7.2  --   --   --   --  5.4  --   HGB 6.0*   < > 6.3* 9.8* 7.1* 9.4* 9.2* 8.1*  --   HCT 18.0*   < > 18.9* 28.2* 20.8* 26.2*  25.8* 22.2*  --   MCV 84.1   < > 82.9 81.3 80.3  --  79.1* 79.0*  --   PLT 134*   < > 121* 120* 52*  --  39* 30* 31*   < > = values in this interval not displayed.   Blood Culture    Component Value Date/Time   SDES URINE, RANDOM 09/05/2020 1855   SPECREQUEST  09/05/2020 1855    NONE Performed at [facility], [place] [address], [place], [place] [address]    CULT MULTIPLE SPECIES PRESENT, SUGGEST RECOLLECTION (A) 09/05/2020 1855   REPTSTATUS 09/07/2020 FINAL 09/05/2020 1855    Cardiac Enzymes: No results for input(s): CKTOTAL, CKMB, CKMBINDEX, TROPONINI in the last 168 hours. CBG: Recent Labs  Lab 01/22/21 1641 01/22/21 2009 01/06/2021 0128 01/21/2021 0203 01/19/2021 0414  GLUCAP 72 111* 68* 119* 85   Iron Studies:  Recent Labs    01/22/21 0813  FERRITIN 1,123*   @  lablastinr3@ Studies/Results: CT HEAD WO CONTRAST  Result Date: 01/22/2021 CLINICAL DATA:  Mental status change, unknown cause EXAM: CT HEAD WITHOUT CONTRAST TECHNIQUE: Contiguous axial images were obtained from the base of the skull through the vertex without intravenous contrast. COMPARISON:  None. FINDINGS: Brain: No acute infarct or intracranial hemorrhage. No mass lesion. No midline shift, ventriculomegaly or extra-axial fluid collection. Vascular: No hyperdense vessel or unexpected calcification. Skull: Negative for fracture or focal lesion. Sinuses/Orbits: Normal orbits. Right maxillary sinus mucous retention cyst. No mastoid effusion. Other: None. IMPRESSION: No acute intracranial process. Electronically Signed   By: Primitivo Gauze M.D.   On: 01/22/2021 14:17   DG Abd Portable 1V  Result Date: 01/22/2021 CLINICAL DATA:  45 year old female with nausea vomiting. EXAM: PORTABLE ABDOMEN - 1 VIEW COMPARISON:  CT abdomen pelvis dated 09/05/2020. FINDINGS: No bowel dilatation or evidence of obstruction. No free air or radiopaque calculi. The osseous structures and soft tissues are unremarkable. IMPRESSION:  Negative. Electronically Signed   By: Anner Crete M.D.   On: 01/22/2021 19:32   Medications: . sodium chloride    . sodium chloride    . ceFEPime (MAXIPIME) IV    . dextrose 20 mL/hr at 01/10/2021 0335  . famotidine (PEPCID) IV Stopped (01/06/2021 0141)  . sodium chloride     . sodium chloride   Intravenous Once  . Chlorhexidine Gluconate Cloth  6 each Topical Q0600  . darbepoetin (ARANESP) injection - DIALYSIS  150 mcg Intravenous Q Fri-HD  . hydrocerin   Topical BID  . lipase/protease/amylase  36,000 Units Oral TID  . midodrine  10 mg Oral Once per day on Mon Wed Fri     [facility]  Assessment/Plan: # Malfunctioning HD catheter - IR recommended VVSconsult. Seen by Dr. Oneida Alar 01/19/21 who recommends upper extremity central venogram and new [facility] tomorrow.AppreciateVVSassistance. - has still in a R IJ TDC,did not work on 1/25 - R temp femoral catheter was functional but pt removed it overnight- currently without HD access  # Possible sepsis. W/U per primary. Elevated lactic acid. BC pending. PCCM consulted. Vanc/Cefepime. Per primary.   # ESRDon HD TTS -Trace BLE edema. Stopped KVO IVF. No Labs yet.  -HD today after new access placed but no urgent indications -May need to transititon to CRRT D/T hypotension.   # Covid 19 -off covid precautions per [facility] at this time.  # Anemia - CKD and possible acute component  - Improved with PRBC's (last given through HD catheter with HD on 1/22). No labs today.  -now appears to be stable in the 9's -will obtain outpatient ESA records  # Developmental Delay - Note APS provided consent for HD  # Hypokalemia -Received potassium 76mEq1/25, using 4K bath with dialysis. -K+ 2.9, giving another dose of potassium 39mEq 01/22/21. No labs yet.   # Hypophos  - gently repleted on 1/23 -phos back down to 1.8, will gently replete again with K phos neutral  - Liberalize diet to regular with fluid  restrictions once she is no longer NPO- does not need renal diet.    Riyad Keena H. Phi Avans NP-C 12/31/2020, 8:29 AM  [employer] [phone]

## 2021-01-23 NOTE — Progress Notes (Signed)
PCCM Update: Patient arrived to 104M after going to the IR for right femoral tunneled dialysis catheter. She remained hypotensive and was started on peripheral levophed. A left femoral CVL catheter was placed under sterile technique.  Blood cultures were sent along with repeat labs.  We will continue broad spectrum antibiotics  I updated her mother at the bedside.    > 35 minutes spent on re-assessing the patient  Tamara Jackson, MD [place] Office: [phone]   See Amion for Pager Details

## 2021-01-24 ENCOUNTER — Encounter (HOSPITAL_COMMUNITY): Payer: Self-pay | Admitting: Vascular Surgery

## 2021-01-24 ENCOUNTER — Inpatient Hospital Stay (HOSPITAL_COMMUNITY): Payer: Medicare Other

## 2021-01-24 DIAGNOSIS — R6250 Unspecified lack of expected normal physiological development in childhood: Secondary | ICD-10-CM

## 2021-01-24 DIAGNOSIS — I82409 Acute embolism and thrombosis of unspecified deep veins of unspecified lower extremity: Secondary | ICD-10-CM

## 2021-01-24 DIAGNOSIS — D65 Disseminated intravascular coagulation [defibrination syndrome]: Secondary | ICD-10-CM

## 2021-01-24 DIAGNOSIS — Z66 Do not resuscitate: Secondary | ICD-10-CM

## 2021-01-24 DIAGNOSIS — N186 End stage renal disease: Secondary | ICD-10-CM | POA: Diagnosis not present

## 2021-01-24 LAB — PREPARE PLATELET PHERESIS
Unit division: 0
Unit division: 0

## 2021-01-24 LAB — BPAM RBC
Blood Product Expiration Date: 202202122359
Blood Product Expiration Date: 202202212359
ISSUE DATE / TIME: 202201251154
ISSUE DATE / TIME: 202201282122
Unit Type and Rh: 5100
Unit Type and Rh: 5100

## 2021-01-24 LAB — BPAM PLATELET PHERESIS
Blood Product Expiration Date: 202201282359
Blood Product Expiration Date: 202201292359
ISSUE DATE / TIME: 202201281639
ISSUE DATE / TIME: 202201281948
Unit Type and Rh: 6200
Unit Type and Rh: 6200

## 2021-01-24 LAB — COMPREHENSIVE METABOLIC PANEL
ALT: 33 U/L (ref 0–44)
AST: 37 U/L (ref 15–41)
Albumin: 1.8 g/dL — ABNORMAL LOW (ref 3.5–5.0)
Alkaline Phosphatase: 56 U/L (ref 38–126)
Anion gap: 18 — ABNORMAL HIGH (ref 5–15)
BUN: 20 mg/dL (ref 6–20)
CO2: 16 mmol/L — ABNORMAL LOW (ref 22–32)
Calcium: 7 mg/dL — ABNORMAL LOW (ref 8.9–10.3)
Chloride: 105 mmol/L (ref 98–111)
Creatinine, Ser: 5.12 mg/dL — ABNORMAL HIGH (ref 0.44–1.00)
GFR, Estimated: 10 mL/min — ABNORMAL LOW (ref >60)
Glucose, Bld: 123 mg/dL — ABNORMAL HIGH (ref 70–99)
Potassium: 3.1 mmol/L — ABNORMAL LOW (ref 3.5–5.1)
Sodium: 139 mmol/L (ref 135–145)
Total Bilirubin: 2 mg/dL — ABNORMAL HIGH (ref 0.3–1.2)
Total Protein: 4 g/dL — ABNORMAL LOW (ref 6.5–8.1)

## 2021-01-24 LAB — PREPARE CRYOPRECIPITATE
Unit division: 0
Unit division: 0

## 2021-01-24 LAB — CBC WITH DIFFERENTIAL/PLATELET
Abs Immature Granulocytes: 0.02 10*3/uL (ref 0.00–0.07)
Basophils Absolute: 0 10*3/uL (ref 0.0–0.1)
Basophils Relative: 0 %
Eosinophils Absolute: 0.1 10*3/uL (ref 0.0–0.5)
Eosinophils Relative: 1 %
HCT: 22.9 % — ABNORMAL LOW (ref 36.0–46.0)
Hemoglobin: 8.5 g/dL — ABNORMAL LOW (ref 12.0–15.0)
Immature Granulocytes: 0 %
Lymphocytes Relative: 26 %
Lymphs Abs: 2.1 10*3/uL (ref 0.7–4.0)
MCH: 30.1 pg (ref 26.0–34.0)
MCHC: 37.1 g/dL — ABNORMAL HIGH (ref 30.0–36.0)
MCV: 81.2 fL (ref 80.0–100.0)
Monocytes Absolute: 0.2 10*3/uL (ref 0.1–1.0)
Monocytes Relative: 2 %
Neutro Abs: 5.8 10*3/uL (ref 1.7–7.7)
Neutrophils Relative %: 71 %
Platelets: 84 10*3/uL — ABNORMAL LOW (ref 150–400)
RBC: 2.82 MIL/uL — ABNORMAL LOW (ref 3.87–5.11)
RDW: 16.3 % — ABNORMAL HIGH (ref 11.5–15.5)
WBC: 8.3 10*3/uL (ref 4.0–10.5)
nRBC: 0 % (ref 0.0–0.2)

## 2021-01-24 LAB — BASIC METABOLIC PANEL
Anion gap: 16 — ABNORMAL HIGH (ref 5–15)
BUN: 20 mg/dL (ref 6–20)
CO2: 17 mmol/L — ABNORMAL LOW (ref 22–32)
Calcium: 7 mg/dL — ABNORMAL LOW (ref 8.9–10.3)
Chloride: 106 mmol/L (ref 98–111)
Creatinine, Ser: 4.99 mg/dL — ABNORMAL HIGH (ref 0.44–1.00)
GFR, Estimated: 10 mL/min — ABNORMAL LOW (ref >60)
Glucose, Bld: 135 mg/dL — ABNORMAL HIGH (ref 70–99)
Potassium: 3.3 mmol/L — ABNORMAL LOW (ref 3.5–5.1)
Sodium: 139 mmol/L (ref 135–145)

## 2021-01-24 LAB — MAGNESIUM: Magnesium: 1.8 mg/dL (ref 1.7–2.4)

## 2021-01-24 LAB — GLUCOSE, CAPILLARY
Glucose-Capillary: 128 mg/dL — ABNORMAL HIGH (ref 70–99)
Glucose-Capillary: 84 mg/dL (ref 70–99)
Glucose-Capillary: 82 mg/dL (ref 70–99)
Glucose-Capillary: 91 mg/dL (ref 70–99)
Glucose-Capillary: 94 mg/dL (ref 70–99)
Glucose-Capillary: 85 mg/dL (ref 70–99)

## 2021-01-24 LAB — BPAM CRYOPRECIPITATE
Blood Product Expiration Date: 202201280042
Blood Product Expiration Date: 202201280042
ISSUE DATE / TIME: 202201272040
ISSUE DATE / TIME: 202201280028
Unit Type and Rh: 5100
Unit Type and Rh: 5100

## 2021-01-24 LAB — RETICULOCYTES
Immature Retic Fract: 3.7 % (ref 2.3–15.9)
RBC.: 2.89 MIL/uL — ABNORMAL LOW (ref 3.87–5.11)
Retic Count, Absolute: 28.9 10*3/uL (ref 19.0–186.0)
Retic Ct Pct: 1 % (ref 0.4–3.1)

## 2021-01-24 LAB — TYPE AND SCREEN
ABO/RH(D): O POS
Antibody Screen: NEGATIVE
Unit division: 0
Unit division: 0

## 2021-01-24 LAB — D-DIMER, QUANTITATIVE: D-Dimer, Quant: 0.75 ug/mL-FEU — ABNORMAL HIGH (ref 0.00–0.50)

## 2021-01-24 LAB — PROTIME-INR
INR: 1.3 — ABNORMAL HIGH (ref 0.8–1.2)
Prothrombin Time: 15.6 seconds — ABNORMAL HIGH (ref 11.4–15.2)

## 2021-01-24 LAB — LACTATE DEHYDROGENASE: LDH: 550 U/L — ABNORMAL HIGH (ref 98–192)

## 2021-01-24 LAB — FERRITIN: Ferritin: 1293 ng/mL — ABNORMAL HIGH (ref 11–307)

## 2021-01-24 LAB — C-REACTIVE PROTEIN: CRP: 3 mg/dL — ABNORMAL HIGH (ref <1.0)

## 2021-01-24 LAB — PROCALCITONIN: Procalcitonin: 30.09 ng/mL

## 2021-01-24 LAB — FIBRINOGEN: Fibrinogen: 151 mg/dL — ABNORMAL LOW (ref 210–475)

## 2021-01-24 LAB — APTT: aPTT: 39 seconds — ABNORMAL HIGH (ref 24–36)

## 2021-01-24 MED ORDER — SODIUM CHLORIDE 0.9% FLUSH
10.0000 mL | Freq: Two times a day (BID) | INTRAVENOUS | Status: DC
  Administered 2021-01-24 – 2021-01-25 (×2): 10 mL
  Administered 2021-01-25: 22:00:00 30 mL
  Administered 2021-01-26 – 2021-01-28 (×5): 10 mL

## 2021-01-24 MED ORDER — FAMOTIDINE 20 MG PO TABS: 20.0000 mg | ORAL_TABLET | Freq: Every day | ORAL | Status: DC

## 2021-01-24 MED ORDER — LEVOTHYROXINE SODIUM 25 MCG PO TABS: 25.0000 ug | ORAL_TABLET | Freq: Every day | ORAL | Status: DC

## 2021-01-24 MED ORDER — SODIUM CHLORIDE 0.9% FLUSH
10.0000 mL | INTRAVENOUS | Status: DC | PRN
  Administered 2021-01-26: 23:00:00 10 mL

## 2021-01-24 MED ORDER — WHITE PETROLATUM EX OINT
TOPICAL_OINTMENT | CUTANEOUS | Status: AC
  Filled 2021-01-24: qty 28.35

## 2021-01-24 MED ORDER — FAMOTIDINE IN NACL 20-0.9 MG/50ML-% IV SOLN
20.0000 mg | INTRAVENOUS | Status: DC
  Administered 2021-01-24 – 2021-01-26 (×3): 20 mg via INTRAVENOUS
  Filled 2021-01-24 (×4): qty 50

## 2021-01-24 NOTE — Progress Notes (Signed)
Looks good after HD catheter exchange / venogram. Will need permanent access in future, but only once medically improved. Will check back in Monday.  Tamara [name]. Stanford Breed, MD [facility] Phone Number: [phone] 01/24/2021 10:23 AM

## 2021-01-24 NOTE — Progress Notes (Signed)
VASCULAR LAB    Right lower extremity venous duplex has been performed.  See CV proc for preliminary results.   Khaalid Lefkowitz, RVT 01/24/2021, 6:51 PM

## 2021-01-24 NOTE — Progress Notes (Signed)
eLink Physician-Brief Progress Note Patient Name: Tamara [name] DOB: [birth year] MRN: [id number]   Date of Service  01/24/2021  HPI/Events of Note  Patient refusing PO medications.   eICU Interventions  Will change Pepcid PO to Pepcid IV.      Intervention Category Major Interventions: Other:  Lysle Dingwall 01/24/2021, 9:46 PM

## 2021-01-24 NOTE — Progress Notes (Signed)
Labs reviewed Platelet count, INR, APTT and fibrinogen all are improving. Primary team treating her for possible sepsis with concern for bacteremia Will continue to follow her periodically Please consider CBC, CMP, fibrinogen, PT, PTT daily for [place] to monitor the status of the DIC.  Xyon Lukasik M.D.

## 2021-01-24 NOTE — Progress Notes (Signed)
[facility] Progress Note   Subjective: Seen in room, pt is confused, hands in mittens. Able to follow simple commands.   Objective Vitals:   01/24/21 1000 01/24/21 1015 01/24/21 1030 01/24/21 1045  BP: (!) 101/56 108/72 (!) 86/39 (!) 91/47  Pulse: 93 98 99 93  Resp: 18 11 (!) 22 14  Temp:      TempSrc:      SpO2: 100% 100% 100% 100%  Weight:      Height:       Physical Exam General:Chronically ill appearing female, looks older than stated age, in NAD Heart: S1,S2 No M/R/G SR on monitor Lungs: CTAB decreased in bases Abdomen: Soft, active BS Extremities: Trace BLE edema Dialysis Access: RIJ TDC    [facility]ialysis:[facility]  Assessment/Plan: # Malfunctioning HD catheter - IR recommended VVSconsult. Seen by VVS who did bilat venogram and insertion of R femoral TDC (R IJ TDC removed).AppreciateVVSassistance.  # Possible sepsis. W/U per primary. Elevated lactic acid. BC pending. PCCM consulted. Vanc/Cefepime. Off pressors today.   # ESRDon HD TTS - had HD 1/22 and 1/25. No HD today, wait until BP stabilizes. Will reassess daily.   # Covid 19 -off covid precautions per [facility] at this time.  # Anemia - CKD and possible acute component  - Improved with PRBC's (last given through HD catheter with HD on 1/22). No labs today.  -now appears to be stable in the 9's -will obtain outpatient ESA records  # Developmental Delay  # Hypophos  - gently repleted on 1/23 -phos back down to 1.8, will gently replete again with K phos neutral  - Liberalize diet to regular with fluid restrictions once she is no longer NPO- does not need renal diet.   # DNR - pt pulled the temp HD cath out. Pt has no idea what dialysis is. She is poor candidate for aggressive interventions. Recommend code status be changed to DNR. Will d/w CCM MD. If declines significantly further would recommend transition to comfort care.    Kelly Splinter, MD 01/24/2021, 11:38  AM         Additional Objective Labs: Basic Metabolic Panel: Recent Labs  Lab 01/18/21 3846 01/19/21 0247 01/20/21 6599 01/22/21 0813 01/22/2021 1735 01/24/21 0014 01/24/21 0322  NA 136 135   < > 142 141 139 139  K 3.0* 3.0*   < > 2.9* 2.9* 3.3* 3.1*  CL 98 100   < > 108 108 106 105  CO2 22 23   < > 23 17* 17* 16*  GLUCOSE 61* 60*   < > 72 90 135* 123*  BUN 12 12   < > 19 21* 20 20  CREATININE 3.43* 3.85*   < > 4.33* 5.26* 4.99* 5.12*  CALCIUM 7.5* 7.0*   < > 7.0* 7.0* 7.0* 7.0*  PHOS 1.9* 2.2*  --  1.8*  --   --   --    < > = values in this interval not displayed.   Liver Function Tests: Recent Labs  Lab 01/22/21 0813 01/02/2021 1735 01/24/21 0322  AST 23 41 37  ALT 21 33 33  ALKPHOS 61 56 56  BILITOT 1.8* 1.9* 2.0*  PROT 3.7* 3.8* 4.0*  ALBUMIN 1.7* 1.7* 1.8*   Recent Labs  Lab 01/22/21 2126  LIPASE 15   CBC: Recent Labs  Lab 01/17/21 1327 01/18/21 0738 01/20/21 0338 01/20/21 1925 01/21/21 0400 01/22/21 0813 01/22/21 1600 01/21/2021 1314 01/24/21 0322  WBC 9.1   < > 10.2  --  11.9* 8.7  --  6.2 8.3  NEUTROABS 7.2  --   --   --   --  5.4  --   --  5.8  HGB 6.3*   < > 7.1*   < > 9.2* 8.1*  --  7.0* 8.5*  HCT 18.9*   < > 20.8*   < > 25.8* 22.2*  --  20.5* 22.9*  MCV 82.9   < > 80.3  --  79.1* 79.0*  --  81.3 81.2  PLT 121*   < > 52*  --  39* 30* 31* 25* 84*   < > = values in this interval not displayed.   Blood Culture    Component Value Date/Time   SDES URINE, RANDOM 09/05/2020 1855   SPECREQUEST  09/05/2020 1855    NONE Performed at [facility], [place] [address] [place], [place] [address]    CULT MULTIPLE SPECIES PRESENT, SUGGEST RECOLLECTION (A) 09/05/2020 1855   REPTSTATUS 09/07/2020 FINAL 09/05/2020 1855    Cardiac Enzymes: No results for input(s): CKTOTAL, CKMB, CKMBINDEX, TROPONINI in the last 168 hours. CBG: Recent Labs  Lab 01/13/2021 1741 01/04/2021 1914 01/07/2021 2305 01/24/21 0330 01/24/21 0719  GLUCAP 85 95 99 128* 84    Iron Studies:  Recent Labs    01/24/21 0322  FERRITIN 1,293*   @lablastinr3 @ Studies/Results: CT HEAD WO CONTRAST  Result Date: 01/22/2021 CLINICAL DATA:  Mental status change, unknown cause EXAM: CT HEAD WITHOUT CONTRAST TECHNIQUE: Contiguous axial images were obtained from the base of the skull through the vertex without intravenous contrast. COMPARISON:  None. FINDINGS: Brain: No acute infarct or intracranial hemorrhage. No mass lesion. No midline shift, ventriculomegaly or extra-axial fluid collection. Vascular: No hyperdense vessel or unexpected calcification. Skull: Negative for fracture or focal lesion. Sinuses/Orbits: Normal orbits. Right maxillary sinus mucous retention cyst. No mastoid effusion. Other: None. IMPRESSION: No acute intracranial process. Electronically Signed   By: Primitivo Gauze M.D.   On: 01/22/2021 14:17   DG Chest Port 1 View  Result Date: 12/28/2020 CLINICAL DATA:  Dialysis catheter placement EXAM: PORTABLE CHEST 1 VIEW COMPARISON:  01/02/2021 FINDINGS: Interval removal of the right internal jugular dialysis catheter and placement of dialysis catheter from below. This passes through the right atrium and into the superior vena cava or right innominate vein. Lungs are clear. Heart is normal size. No effusions. IMPRESSION: Interval placement of dialysis catheter from lower extremity which passes through the right heart and is located in the upper SVC or right innominate vein. Electronically Signed   By: Rolm Baptise M.D.   On: 01/11/2021 15:34   DG Abd Portable 1V  Result Date: 01/22/2021 CLINICAL DATA:  45 year old female with nausea vomiting. EXAM: PORTABLE ABDOMEN - 1 VIEW COMPARISON:  CT abdomen pelvis dated 09/05/2020. FINDINGS: No bowel dilatation or evidence of obstruction. No free air or radiopaque calculi. The osseous structures and soft tissues are unremarkable. IMPRESSION: Negative. Electronically Signed   By: Anner Crete M.D.   On: 01/22/2021 19:32    HYBRID OR IMAGING ([facility] ONLY)  Result Date: 01/12/2021 There is no interpretation for this exam.  This order is for images obtained during a surgical procedure.  Please See "Surgeries" Tab for more information regarding the procedure.   Medications: . sodium chloride    . ceFEPime (MAXIPIME) IV Stopped (01/22/2021 2125)  . dextrose 20 mL/hr at 01/24/21 0900  . metronidazole 500 mg (01/24/21 1011)   . chlorhexidine  15 mL Mouth Rinse BID  .  Chlorhexidine Gluconate Cloth  6 each Topical Q0600  . darbepoetin (ARANESP) injection - DIALYSIS  150 mcg Intravenous Q Fri-HD  . famotidine  20 mg Oral QHS  . hydrocerin   Topical BID  . levothyroxine  25 mcg Oral Q0600  . mouth rinse  15 mL Mouth Rinse q12n4p  . midodrine  10 mg Oral TID  . sodium chloride flush  10-40 mL Intracatheter Q12H

## 2021-01-24 NOTE — Progress Notes (Signed)
Regular diet ordered for patient, but patient refusing to eat. Patient also refusing sips of sprite or meds with apple sauce. Dr. Halford Chessman notified.

## 2021-01-24 NOTE — Progress Notes (Signed)
NAME:  Tamara [name], MRN:  [id number], DOB:  1976-12-20, LOS: 6 ADMISSION DATE:  01/02/2021, CONSULTATION DATE: 01/26/2021 REFERRING MD: [facility], CHIEF COMPLAINT: Hypotension  Brief History:  45 yo female with hx of developmental delay and ward of the state is dialysis dependent and had non-functioning Rt hemodialysis catheter.  She had Rt femoral hemodialysis catheter placed which she pulled out.  She developed hypotension on 1/27 and transferred to ICU on 1/28.  She had replacement of Rt femoral catheter in the OR on 1/28.    Past Medical History:  COVID 49 in February 2021 and January 2022, Pancreatitis, ESRD, Anxiety and depression, GERD, Gout, HLD, HTN, Cardiac arrest, PUD  Significant Hospital Events:  1/28 Transfer to ICU, removal of Rt IJ dialysis catheter and placement of Rt femoral palindrome catheter 1/29 off levophed, transfer to floor bed  Consults:  Nephrology Hematology Vascular surgery  Procedures:  Rt femoral palindrome catheter 1/28 >> Lt femoral CVL 1/28 >>   Significant Diagnostic Tests:  CT head 1/27 >> no acute process  Micro Data:  MRSA PCR 1/27 >> negative Blood 1/28 >>   Antimicrobials:  Cefepime 1/27 >> Vancomycin 1/27 >> Flagyl 1/27 >>  Interim History / Subjective:  Answers yes/no to questions.  Off levophed.  Objective   Blood pressure (!) 91/47, pulse 96, temperature 98.4 F (36.9 C), temperature source Oral, resp. rate 17, height 5\' 5"  (1.651 m), weight 67.2 kg, SpO2 100 %.        Intake/Output Summary (Last 24 hours) at 01/24/2021 1009 Last data filed at 01/24/2021 0900 Gross per 24 hour  Intake 2200.37 ml  Output 30 ml  Net 2170.37 ml   Filed Weights   01/21/21 0145 01/22/21 2323 01/12/2021 0622  Weight: 66.4 kg 67.6 kg 67.2 kg    Examination:  General - sleepy Eyes - pupils reactive ENT - no sinus tenderness, no stridor Cardiac - regular rate/rhythm, no murmur Chest - equal breath sounds b/l, no wheezing or rales Abdomen -  soft, non tender, + bowel sounds Extremities - ecchymosis throughout Rt arm Skin - no rashes Neuro - wakes up easily, follows simple commands, moves extremities   Resolved Hospital Problem list     Assessment & Plan:   Chronic hypotension. - off levophed 1/29 - goal MAP > 60 - resume midodrine  ESRD with clogged Rt IJ HD catheter s/p removal and replacement with Rt femoral HD catheter. - agree she is not candidate for CRRT given her overall clinical status  - nephrology and vascular surgery following  Possible sepsis with concern for bacteremia. - has elevated procalcitonin - f/u blood culture - continue ABx  Anemia, thrombocytopenia with concern for DIC. - hematology consulted - f/u CBC, DIC panel, LDH  Hypothyroidism. - TSH 15.227 from 01/19/21 - synthroid  Developmentally delayed with significant cognitive deficit. Hx of anxiety and depression. - sitter at bedside  Goals of care. - ward of state - given her very poor functional status I agree that intubation and mechanical ventilation or cardiac resuscitation in the event of cardiac arrest would represent futile care - DNR/DNI  D/w Dr. Sheron Nightingale practice (evaluated daily)  Diet: Regular diet DVT prophylaxis: SCDs GI prophylaxis: pepcid Mobility: Bedrest Disposition: med surg  To [facility] 1/30 and PCCM off.  Labs    CMP Latest Ref Rng & Units 01/24/2021 01/24/2021 01/13/2021  Glucose 70 - 99 mg/dL 123(H) 135(H) 90  BUN 6 - 20 mg/dL 20 20 21(H)  Creatinine 0.44 -  1.00 mg/dL 5.12(H) 4.99(H) 5.26(H)  Sodium 135 - 145 mmol/L 139 139 141  Potassium 3.5 - 5.1 mmol/L 3.1(L) 3.3(L) 2.9(L)  Chloride 98 - 111 mmol/L 105 106 108  CO2 22 - 32 mmol/L 16(L) 17(L) 17(L)  Calcium 8.9 - 10.3 mg/dL 7.0(L) 7.0(L) 7.0(L)  Total Protein 6.5 - 8.1 g/dL 4.0(L) - 3.8(L)  Total Bilirubin 0.3 - 1.2 mg/dL 2.0(H) - 1.9(H)  Alkaline Phos 38 - 126 U/L 56 - 56  AST 15 - 41 U/L 37 - 41  ALT 0 - 44 U/L 33 - 33    CBC Latest Ref  Rng & Units 01/24/2021 01/25/2021 01/22/2021  WBC 4.0 - 10.5 K/uL 8.3 6.2 -  Hemoglobin 12.0 - 15.0 g/dL 8.5(L) 7.0(L) -  Hematocrit 36.0 - 46.0 % 22.9(L) 20.5(L) -  Platelets 150 - 400 K/uL 84(L) 25(LL) 31(L)    CBG (last 3)  Recent Labs    01/21/2021 2305 01/24/21 0330 01/24/21 0719  GLUCAP 99 128* 84    Signature:  Chesley Mires, MD [place] Pager - [phone] 01/24/2021, 10:09 AM

## 2021-01-25 DIAGNOSIS — D649 Anemia, unspecified: Secondary | ICD-10-CM | POA: Diagnosis not present

## 2021-01-25 LAB — PROCALCITONIN: Procalcitonin: 22.44 ng/mL

## 2021-01-25 LAB — GLUCOSE, CAPILLARY
Glucose-Capillary: 109 mg/dL — ABNORMAL HIGH (ref 70–99)
Glucose-Capillary: 96 mg/dL (ref 70–99)
Glucose-Capillary: 90 mg/dL (ref 70–99)
Glucose-Capillary: 88 mg/dL (ref 70–99)
Glucose-Capillary: 87 mg/dL (ref 70–99)

## 2021-01-25 LAB — COMPREHENSIVE METABOLIC PANEL
ALT: 32 U/L (ref 0–44)
AST: 27 U/L (ref 15–41)
Albumin: 1.7 g/dL — ABNORMAL LOW (ref 3.5–5.0)
Alkaline Phosphatase: 61 U/L (ref 38–126)
Anion gap: 17 — ABNORMAL HIGH (ref 5–15)
BUN: 22 mg/dL — ABNORMAL HIGH (ref 6–20)
CO2: 15 mmol/L — ABNORMAL LOW (ref 22–32)
Calcium: 7 mg/dL — ABNORMAL LOW (ref 8.9–10.3)
Chloride: 107 mmol/L (ref 98–111)
Creatinine, Ser: 5.56 mg/dL — ABNORMAL HIGH (ref 0.44–1.00)
GFR, Estimated: 9 mL/min — ABNORMAL LOW (ref >60)
Glucose, Bld: 128 mg/dL — ABNORMAL HIGH (ref 70–99)
Potassium: 2.9 mmol/L — ABNORMAL LOW (ref 3.5–5.1)
Sodium: 139 mmol/L (ref 135–145)
Total Bilirubin: 1.8 mg/dL — ABNORMAL HIGH (ref 0.3–1.2)
Total Protein: 3.7 g/dL — ABNORMAL LOW (ref 6.5–8.1)

## 2021-01-25 LAB — CBC WITH DIFFERENTIAL/PLATELET
Abs Immature Granulocytes: 0.02 10*3/uL (ref 0.00–0.07)
Basophils Absolute: 0 10*3/uL (ref 0.0–0.1)
Basophils Relative: 0 %
Eosinophils Absolute: 0 10*3/uL (ref 0.0–0.5)
Eosinophils Relative: 1 %
HCT: 23.1 % — ABNORMAL LOW (ref 36.0–46.0)
Hemoglobin: 8.2 g/dL — ABNORMAL LOW (ref 12.0–15.0)
Immature Granulocytes: 0 %
Lymphocytes Relative: 22 %
Lymphs Abs: 1.2 10*3/uL (ref 0.7–4.0)
MCH: 29 pg (ref 26.0–34.0)
MCHC: 35.5 g/dL (ref 30.0–36.0)
MCV: 81.6 fL (ref 80.0–100.0)
Monocytes Absolute: 0.1 10*3/uL (ref 0.1–1.0)
Monocytes Relative: 3 %
Neutro Abs: 4.2 10*3/uL (ref 1.7–7.7)
Neutrophils Relative %: 74 %
Platelets: 42 10*3/uL — ABNORMAL LOW (ref 150–400)
RBC: 2.83 MIL/uL — ABNORMAL LOW (ref 3.87–5.11)
RDW: 16.8 % — ABNORMAL HIGH (ref 11.5–15.5)
WBC: 5.7 10*3/uL (ref 4.0–10.5)
nRBC: 0 % (ref 0.0–0.2)

## 2021-01-25 LAB — DIC (DISSEMINATED INTRAVASCULAR COAGULATION)PANEL
D-Dimer, Quant: 1.02 ug/mL-FEU — ABNORMAL HIGH (ref 0.00–0.50)
Fibrinogen: 134 mg/dL — ABNORMAL LOW (ref 210–475)
INR: 1.4 — ABNORMAL HIGH (ref 0.8–1.2)
Platelets: 41 10*3/uL — ABNORMAL LOW (ref 150–400)
Prothrombin Time: 16.6 seconds — ABNORMAL HIGH (ref 11.4–15.2)
Smear Review: NONE SEEN
aPTT: 43 seconds — ABNORMAL HIGH (ref 24–36)

## 2021-01-25 LAB — LACTATE DEHYDROGENASE: LDH: 548 U/L — ABNORMAL HIGH (ref 98–192)

## 2021-01-25 MED ORDER — ACETAMINOPHEN 650 MG RE SUPP
650.0000 mg | Freq: Four times a day (QID) | RECTAL | Status: DC | PRN
  Administered 2021-01-25: 650 mg via RECTAL
  Filled 2021-01-25: qty 1

## 2021-01-25 MED ORDER — POTASSIUM CHLORIDE 10 MEQ/50ML IV SOLN
10.0000 meq | INTRAVENOUS | Status: AC
  Administered 2021-01-25 (×3): 10 meq via INTRAVENOUS
  Filled 2021-01-25 (×3): qty 50

## 2021-01-25 MED ORDER — ACETAMINOPHEN 650 MG RE SUPP: 650.0000 mg | RECTAL | Status: DC | PRN

## 2021-01-25 MED ORDER — POTASSIUM CHLORIDE 10 MEQ/50ML IV SOLN: 10.0000 meq | INTRAVENOUS | Status: DC

## 2021-01-25 MED ORDER — ACETAMINOPHEN 325 MG PO TABS: 650.0000 mg | ORAL_TABLET | Freq: Four times a day (QID) | ORAL | Status: DC | PRN

## 2021-01-25 MED ORDER — RESOURCE THICKENUP CLEAR PO POWD
ORAL | Status: DC | PRN
  Filled 2021-01-25: qty 125

## 2021-01-25 MED ORDER — VANCOMYCIN VARIABLE DOSE PER UNSTABLE RENAL FUNCTION (PHARMACIST DOSING): Status: DC

## 2021-01-25 NOTE — Progress Notes (Signed)
[facility] Progress Note  Patient: Tamara [name]    IPJ:[id number]  DOA: 01/07/2021     Date of Service: the patient was seen and examined on 01/25/2021  Brief hospital course: 45 year old with history of ESRD HD, developmental delay/diminished mental capacity, HTN, COVID-19 with ARDS February 2021, depression, anxiety admitted for malfunction of hemodialysis catheter initially at [facility] was unsuccessful therefore general surgery placed nontunneled catheter and patient was transferred to [facility] for vascular surgery evaluation. Also incidental finding of COVID-19 infection, largely asymptomatic. Vascular surgery, nephrology as well as hematology consulted. PCCM consulted due to worsening hemodynamics.  Transfer to ICU and was given peripheral pressors.  ICU attending as well as nephrology recommend DNR. Currently transferred back to hospitalist service. Plan is to continue engage with goals of care conversation with legal guardian and continue current care.  Per ICU and nephrology further escalation of care will be futile.  Assessment and Plan: Severe thrombocytopenia. DIC Anemia of chronic kidney disease with acute component. Hematology consulted due to ongoing worsening platelets. HIT panel unremarkable. Work-up currently supports possible diagnosis of DIC given low fibrinogen level and scattered schistocytes. Received cryoprecipitate. No role of anticoagulation for now  currently no evidence of thrombosis. Transfuse for hemoglobin less than 7.  Sepsis. Shock/hypotension. Hypothermia Given current diagnosis of DIC patient was empirically started on IV cefepime on 01/22/2021. Cultures were so far negative. Lactic acid was elevated to 6. Repeat lactic acid were not able to be obtained due to poor IV access despite multiple phlebotomy attempt. PCCM was consulted for a line placement as well as IV access for labs. MRSA PCR was negative. Flagyl and  vancomycin was added as well. Currently on [facility]. Continue with IV antibiotics.  ESRD on HD Nephrology currently following. Receiving hemodialysis via temporary catheter. Currently missed HD but no indication right now. Per nephrology outpatient hemodialysis is not recommended for long-term given her lack of understanding of hemodialysis as well as multiple comorbidities.  Hypokalemia, hypomagnesemia, hypophosphatemia. Currently being replaced. Monitor.  Hypoglycemia From poor oral intake.  Continue to monitor. On D10 drip. Monitor CBG every 4 hours.  Essential hypertension. Chronic hypotension. On midodrine 3 times a week. Will ncrease the frequency to 3 times daily. Currently not on any antihypertensive medication  COVID-19 infection, asymptomatic Initially tested positive-01/06/2021. Isolation discontinued Continue supportive care.  Saturating well on room air.  Chronic pancreatitis Intractable nausea and vomiting. On Creon.  Hold  Hypothyroidism, currently sick euthyroid syndrome Free T4 elevated, TSH also elevated.  Currently no therapy. Recheck in 4 weeks.  Acute metabolic encephalopathy Progressively confused and lethargic. CT head unremarkable. Likely consistent with DIC. Although etiology still not clear. Discontinue psychotropic medication. Monitor on telemetry. Patient's diet was advanced by ICU to regular diet although at present patient is not able to follow commands and therefore not safe to swallow his regular consistency diet. We will initiate dysphagia 1 diet and follow-up on speech therapy recommendation.  Depression. On Zoloft at home. Currently on hold. Monitor.  Bilateral upper extremity bruising. Etiology not clear. Monitor for now.  Elevated INR. 1 dose of IV vitamin K.  Goals of care conversation. Patient has a legal guardian with [place]. Any procedures or any intervention that requires a consent DHS must be  notified. Discussed with social worker on call for [facility] and recommended the patient is a poor candidate for hemodialysis outpatient and currently with multiple comorbidities with ongoing poor p.o. intake with very poor prognosis. Recommended transitioning  to comfort care for this patient.  Diet: Dysphagia diet secondary to mental status. DVT Prophylaxis:   SCDs Start: 01/17/21 0001    Advance goals of care discussion: Full code  Family Communication: no family was present at bedside, at the time of interview.  Discussed with patient's legal guardian on the phone. Opportunity was given to ask question and all questions were answered satisfactorily.   Disposition:  Status is: Inpatient  Remains inpatient appropriate because:Hemodynamically unstable   Dispo: The patient is from: Home              Anticipated d/c is to: to be determined              Anticipated d/c date is: > 3 days              Patient currently is not medically stable to d/c.   Difficult to place patient No  Subjective: Remains confused and lethargic.  Unable to follow command but unable to answer questions appropriately.  No acute events overnight but currently on Bair hugger.  Physical Exam:  General: Appear in mild distress, bilateral upper extremity erythema rash; Oral Mucosa Clear, moist. no Abnormal Neck Mass Or lumps, Conjunctiva normal  Cardiovascular: S1 and S2 Present, no Murmur, Respiratory: Normal respiratory effort, Bilateral Air entry present and bilateral  Crackles, no wheezes Abdomen: Bowel Sound present, Soft and no tenderness Extremities: trace Pedal edema Neurology: Not alert, lethargic, not oriented to time, place, and person, affect flat, no deficit Gait not checked due to patient safety concerns   Vitals:   01/25/21 1600 01/25/21 1700 01/25/21 1800 01/25/21 1900  BP: 115/66 (!) 103/58 (!) 108/58 (!) 95/54  Pulse: 89 89 (!) 106 100  Resp: 12 14 11 17   Temp:      TempSrc:      SpO2: 100%  100% 97% 99%  Weight:      Height:        Intake/Output Summary (Last 24 hours) at 01/25/2021 1959 Last data filed at 01/25/2021 1900 Gross per 24 hour  Intake 986.85 ml  Output -  Net 986.85 ml   Filed Weights   01/21/21 0145 01/22/21 2323 01/16/2021 0622  Weight: 66.4 kg 67.6 kg 67.2 kg    Data Reviewed: I have personally reviewed and interpreted daily labs, tele strips, imaging. I reviewed all nursing notes, pharmacy notes, vitals, pertinent old records I have discussed plan of care as described above with RN and patient/family.  CBC: Recent Labs  Lab 01/21/21 0400 01/22/21 0813 01/22/21 1600 01/11/2021 1314 01/24/21 0322 01/25/21 0250  WBC 11.9* 8.7  --  6.2 8.3 5.7  NEUTROABS  --  5.4  --   --  5.8 4.2  HGB 9.2* 8.1*  --  7.0* 8.5* 8.2*  HCT 25.8* 22.2*  --  20.5* 22.9* 23.1*  MCV 79.1* 79.0*  --  81.3 81.2 81.6  PLT 39* 30* 31* 25* 84* 41*  42*   Basic Metabolic Panel: Recent Labs  Lab 01/19/21 0247 01/20/21 0338 01/21/21 0400 01/22/21 0813 01/15/2021 1735 01/24/21 0014 01/24/21 0322 01/25/21 0250  NA 135 139 138 142 141 139 139 139  K 3.0* 2.7* 3.9 2.9* 2.9* 3.3* 3.1* 2.9*  CL 100 103 104 108 108 106 105 107  CO2 23 25 23 23  17* 17* 16* 15*  GLUCOSE 60* 86 75 72 90 135* 123* 128*  BUN 12 19 13 19  21* 20 20 22*  CREATININE 3.85* 4.68* 3.14* 4.33* 5.26* 4.99* 5.12*  5.56*  CALCIUM 7.0* 7.1* 6.9* 7.0* 7.0* 7.0* 7.0* 7.0*  MG  --  1.4* 1.5* 2.0  --   --  1.8  --   PHOS 2.2*  --   --  1.8*  --   --   --   --     Studies: No results found.  Scheduled Meds: . chlorhexidine  15 mL Mouth Rinse BID  . Chlorhexidine Gluconate Cloth  6 each Topical Q0600  . darbepoetin (ARANESP) injection - DIALYSIS  150 mcg Intravenous Q Fri-HD  . hydrocerin   Topical BID  . levothyroxine  25 mcg Oral Q0600  . mouth rinse  15 mL Mouth Rinse q12n4p  . midodrine  10 mg Oral TID  . sodium chloride flush  10-40 mL Intracatheter Q12H  . vancomycin variable dose per unstable  renal function (pharmacist dosing)   Does not apply See admin instructions   Continuous Infusions: . sodium chloride    . ceFEPime (MAXIPIME) IV Stopped (01/24/21 1957)  . dextrose 20 mL/hr at 01/25/21 1900  . famotidine (PEPCID) IV Stopped (01/24/21 2319)  . metronidazole 500 mg (01/25/21 1944)   PRN Meds: acetaminophen, dextromethorphan-guaiFENesin, ondansetron (ZOFRAN) IV, Resource ThickenUp Clear, sodium chloride flush  Time spent: 35 minutes  Author: Berle Mull, MD [facility] 01/25/2021   To reach On-call, see care teams to locate the attending and reach out via [email or web address]. Between 7PM-7AM, please contact night-coverage If you still have difficulty reaching the attending provider, please page the [facility] (Director on Call) for [facility] on amion for assistance.

## 2021-01-25 NOTE — Progress Notes (Signed)
[facility] Progress Note   Subjective: Seen in room, confused, no /co.   Objective Vitals:   01/25/21 0545 01/25/21 0600 01/25/21 0615 01/25/21 0700  BP: (!) 99/53 (!) 97/51 (!) 89/53   Pulse: 77 78 76   Resp: 13 14 11    Temp:    (!) 93 F (33.9 C)  TempSrc:    Axillary  SpO2: 100% 100% 100%   Weight:      Height:       Physical Exam General:Chronically ill appearing female, looks older than stated age, in NAD Heart: S1,S2 No M/R/G SR on monitor Lungs: CTAB decreased in bases Abdomen: Soft, active BS Extremities: Trace BLE edema Dialysis Access: RIJ TDC    [facility]  Assessment/Plan: # Malfunctioning HD catheter - IR recommended VVSconsult. Seen by VVS who did bilat venogram and insertion of R femoral TDC (R IJ TDC removed).AppreciateVVSassistance.  # Goals of care - ward of state - she has no idea what dialysis is  - given her very poor functional status I agree that intubation and mechanical ventilation or cardiac resuscitation in the event of cardiac arrest would represent futile care - DNR / DNI - I also agree that dialysis is not indicated for this patient, have d/w primary MD - recommend consult pall care and stop dialysis  # Possible sepsis. W/U per primary. Elevated lactic acid. BC pending. PCCM consulted. Vanc/Cefepime. Off pressors today.   # ESRDon HD TTS - had HD 1/22 and 1/25. Labs remain stable, no indication for acute HD.   # Covid 19 -off covid precautions per [facility] at this time.  # Anemia - CKD and possible acute component  - Improved with PRBC's  -now appears to be stable in the 9's -will obtain outpatient ESA records  # Developmental Delay  # Hypophos  - gently repleted on 1/23 -phos back down to 1.8, will gently replete again with K phos neutral  - Liberalize diet to regular with fluid restrictions once she is no longer NPO- does not need renal diet.    Kelly Splinter, MD 01/25/2021,  7:59 AM         Additional Objective Labs: Basic Metabolic Panel: Recent Labs  Lab 01/19/21 0247 01/20/21 9735 01/22/21 0813 12/28/2020 1735 01/24/21 0014 01/24/21 0322 01/25/21 0250  NA 135   < > 142   < > 139 139 139  K 3.0*   < > 2.9*   < > 3.3* 3.1* 2.9*  CL 100   < > 108   < > 106 105 107  CO2 23   < > 23   < > 17* 16* 15*  GLUCOSE 60*   < > 72   < > 135* 123* 128*  BUN 12   < > 19   < > 20 20 22*  CREATININE 3.85*   < > 4.33*   < > 4.99* 5.12* 5.56*  CALCIUM 7.0*   < > 7.0*   < > 7.0* 7.0* 7.0*  PHOS 2.2*  --  1.8*  --   --   --   --    < > = values in this interval not displayed.   Liver Function Tests: Recent Labs  Lab 12/30/2020 1735 01/24/21 0322 01/25/21 0250  AST 41 37 27  ALT 33 33 32  ALKPHOS 56 56 61  BILITOT 1.9* 2.0* 1.8*  PROT 3.8* 4.0* 3.7*  ALBUMIN 1.7* 1.8* 1.7*   Recent Labs  Lab 01/22/21 2126  LIPASE 15  CBC: Recent Labs  Lab 01/21/21 0400 01/22/21 0813 01/22/21 1600 01/05/2021 1314 01/24/21 0322 01/25/21 0250  WBC 11.9* 8.7  --  6.2 8.3 5.7  NEUTROABS  --  5.4  --   --  5.8 4.2  HGB 9.2* 8.1*  --  7.0* 8.5* 8.2*  HCT 25.8* 22.2*  --  20.5* 22.9* 23.1*  MCV 79.1* 79.0*  --  81.3 81.2 81.6  PLT 39* 30*   < > 25* 84* 41*  42*   < > = values in this interval not displayed.   Blood Culture    Component Value Date/Time   SDES BLOOD SITE NOT SPECIFIED 01/07/2021 2118   SPECREQUEST  01/05/2021 2118    BOTTLES DRAWN AEROBIC AND ANAEROBIC Blood Culture adequate volume   CULT  01/10/2021 2118    NO GROWTH < 24 HOURS Performed at [facility] [address], [place], [place] [address]    REPTSTATUS PENDING 01/14/2021 2118    Cardiac Enzymes: No results for input(s): CKTOTAL, CKMB, CKMBINDEX, TROPONINI in the last 168 hours. CBG: Recent Labs  Lab 01/24/21 1551 01/24/21 1914 01/24/21 2312 01/25/21 0254 01/25/21 0708  GLUCAP 91 94 85 109* 96   Iron Studies:  Recent Labs    01/24/21 0322  FERRITIN 1,293*    @lablastinr3 @ Studies/Results: DG Chest Port 1 View  Result Date: 01/26/2021 CLINICAL DATA:  Dialysis catheter placement EXAM: PORTABLE CHEST 1 VIEW COMPARISON:  12/31/2020 FINDINGS: Interval removal of the right internal jugular dialysis catheter and placement of dialysis catheter from below. This passes through the right atrium and into the superior vena cava or right innominate vein. Lungs are clear. Heart is normal size. No effusions. IMPRESSION: Interval placement of dialysis catheter from lower extremity which passes through the right heart and is located in the upper SVC or right innominate vein. Electronically Signed   By: Rolm Baptise M.D.   On: 01/16/2021 15:34   VAS [place] LOWER EXTREMITY VENOUS (DVT)  Result Date: 01/24/2021  Lower Venous DVT Study Limitations: Bandages and Dialysis access in proximal right thigh, patient cooperation. Comparison Study: No prior study Performing Technologist: Sharion Dove RVS  Examination Guidelines: A complete evaluation includes B-mode imaging, spectral Doppler, color Doppler, and power Doppler as needed of all accessible portions of each vessel. Bilateral testing is considered an integral part of a complete examination. Limited examinations for reoccurring indications may be performed as noted. The reflux portion of the exam is performed with the patient in reverse Trendelenburg.  +---------+---------------+---------+-----------+----------+-------------------+ RIGHT    CompressibilityPhasicitySpontaneityPropertiesThrombus Aging      +---------+---------------+---------+-----------+----------+-------------------+ CFV                                                   Not well visualized +---------+---------------+---------+-----------+----------+-------------------+ SFJ                                                   Not well visualized +---------+---------------+---------+-----------+----------+-------------------+ FV Prox  Full                                                              +---------+---------------+---------+-----------+----------+-------------------+  FV Mid   Full                                                             +---------+---------------+---------+-----------+----------+-------------------+ FV DistalFull           Yes      Yes                                      +---------+---------------+---------+-----------+----------+-------------------+ PFV                                                   Not well visualized +---------+---------------+---------+-----------+----------+-------------------+ POP                     Yes      Yes                                      +---------+---------------+---------+-----------+----------+-------------------+ PTV      Full                                                             +---------+---------------+---------+-----------+----------+-------------------+ PERO     Full                                                             +---------+---------------+---------+-----------+----------+-------------------+   +----+---------------+---------+-----------+----------+--------------+ LEFTCompressibilityPhasicitySpontaneityPropertiesThrombus Aging +----+---------------+---------+-----------+----------+--------------+ CFV Full           Yes      Yes                                 +----+---------------+---------+-----------+----------+--------------+     Summary: RIGHT: - There is no evidence of deep vein thrombosis in the lower extremity. However, portions of this examination were limited- see technologist comments above.  LEFT: - No evidence of common femoral vein obstruction.  *See table(s) above for measurements and observations.    Preliminary    HYBRID OR IMAGING ([facility] ONLY)  Result Date: 01/24/2021 There is no interpretation for this exam.  This order is for images obtained during a surgical procedure.  Please See  "Surgeries" Tab for more information regarding the procedure.   Medications: . sodium chloride    . ceFEPime (MAXIPIME) IV Stopped (01/24/21 1957)  . dextrose 20 mL/hr at 01/25/21 0600  . famotidine (PEPCID) IV Stopped (01/24/21 2319)  . metronidazole Stopped (01/25/21 0347)   . chlorhexidine  15 mL Mouth Rinse BID  . Chlorhexidine Gluconate Cloth  6 each Topical Q0600  . darbepoetin (ARANESP) injection - DIALYSIS  150 mcg Intravenous  Q Fri-HD  . hydrocerin   Topical BID  . levothyroxine  25 mcg Oral Q0600  . mouth rinse  15 mL Mouth Rinse q12n4p  . midodrine  10 mg Oral TID  . sodium chloride flush  10-40 mL Intracatheter Q12H

## 2021-01-25 NOTE — Progress Notes (Signed)
DIC labs reviewed stable mostly except for decreasing fibrinogen. Platelets at 40 K No indication for transfusion unless she is acutely bleeding. HIT ab negative. No indication for cryo Again the reason for DIC is still not clear in this patient. We will continue to monitor.

## 2021-01-26 DIAGNOSIS — D649 Anemia, unspecified: Secondary | ICD-10-CM | POA: Diagnosis not present

## 2021-01-26 LAB — GLUCOSE, CAPILLARY
Glucose-Capillary: 79 mg/dL (ref 70–99)
Glucose-Capillary: 79 mg/dL (ref 70–99)
Glucose-Capillary: 81 mg/dL (ref 70–99)
Glucose-Capillary: 81 mg/dL (ref 70–99)
Glucose-Capillary: 84 mg/dL (ref 70–99)
Glucose-Capillary: 84 mg/dL (ref 70–99)
Glucose-Capillary: 87 mg/dL (ref 70–99)

## 2021-01-26 LAB — CBC WITH DIFFERENTIAL/PLATELET
Abs Immature Granulocytes: 0.03 10*3/uL (ref 0.00–0.07)
Basophils Absolute: 0 10*3/uL (ref 0.0–0.1)
Basophils Relative: 0 %
Eosinophils Absolute: 0 10*3/uL (ref 0.0–0.5)
Eosinophils Relative: 1 %
HCT: 25.1 % — ABNORMAL LOW (ref 36.0–46.0)
Hemoglobin: 8.2 g/dL — ABNORMAL LOW (ref 12.0–15.0)
Immature Granulocytes: 0 %
Lymphocytes Relative: 24 %
Lymphs Abs: 1.6 10*3/uL (ref 0.7–4.0)
MCH: 27.2 pg (ref 26.0–34.0)
MCHC: 32.7 g/dL (ref 30.0–36.0)
MCV: 83.4 fL (ref 80.0–100.0)
Monocytes Absolute: 0.2 10*3/uL (ref 0.1–1.0)
Monocytes Relative: 3 %
Neutro Abs: 4.9 10*3/uL (ref 1.7–7.7)
Neutrophils Relative %: 72 %
Platelets: 45 10*3/uL — ABNORMAL LOW (ref 150–400)
RBC: 3.01 MIL/uL — ABNORMAL LOW (ref 3.87–5.11)
RDW: 17.6 % — ABNORMAL HIGH (ref 11.5–15.5)
WBC: 6.8 10*3/uL (ref 4.0–10.5)
nRBC: 0 % (ref 0.0–0.2)

## 2021-01-26 LAB — COMPREHENSIVE METABOLIC PANEL
ALT: 34 U/L (ref 0–44)
AST: 23 U/L (ref 15–41)
Albumin: 1.8 g/dL — ABNORMAL LOW (ref 3.5–5.0)
Alkaline Phosphatase: 71 U/L (ref 38–126)
Anion gap: 19 — ABNORMAL HIGH (ref 5–15)
BUN: 23 mg/dL — ABNORMAL HIGH (ref 6–20)
CO2: 13 mmol/L — ABNORMAL LOW (ref 22–32)
Calcium: 7.1 mg/dL — ABNORMAL LOW (ref 8.9–10.3)
Chloride: 109 mmol/L (ref 98–111)
Creatinine, Ser: 5.92 mg/dL — ABNORMAL HIGH (ref 0.44–1.00)
GFR, Estimated: 8 mL/min — ABNORMAL LOW (ref >60)
Glucose, Bld: 93 mg/dL (ref 70–99)
Potassium: 3.3 mmol/L — ABNORMAL LOW (ref 3.5–5.1)
Sodium: 141 mmol/L (ref 135–145)
Total Bilirubin: 1.6 mg/dL — ABNORMAL HIGH (ref 0.3–1.2)
Total Protein: 4 g/dL — ABNORMAL LOW (ref 6.5–8.1)

## 2021-01-26 LAB — MAGNESIUM: Magnesium: 1.7 mg/dL (ref 1.7–2.4)

## 2021-01-26 NOTE — Progress Notes (Signed)
Physical Therapy Treatment Patient Details Name: Tamara [name] MRN: [id number] DOB: [birth year] Today's Date: 01/26/2021    History of Present Illness Pt is a 45 y.o. SNF resident admitted 01/15/2021 with clogged R subclavian HD catheter; pt incidentally tested (+) COVID-19. S/p placement of non tunneled R femoral catheter 1/22. And tunneled Rt fem catheter on 1/28. PMH includes ESRD (on HD), developmental delay, HTN, COVID ARDS (01/2020), depression, axniety.    PT Comments    Pt non responsive throughout session with eyes closed and limited AAROM of bil UE with PROM initiation. Pt not following commands and no verbalization throughout. Total assist to slide toward [facility] and reposition trunk. Will continue to attempt progression as medical stability and participate allow.     Follow Up Recommendations  SNF;Supervision for mobility/OOB     Equipment Recommendations  None recommended by PT    Recommendations for Other Services       Precautions / Restrictions Precautions Precautions: Fall;Other (comment) Precaution Comments: Rt tunneled HD cath, Left fem triple lumen    Mobility  Bed Mobility               General bed mobility comments: did not attempt based on pt lethargy  Transfers                    Ambulation/Gait                 Stairs             Wheelchair Mobility    Modified Rankin (Stroke Patients Only)       Balance                                            Cognition Arousal/Alertness: Lethargic Behavior During Therapy: Flat affect                                   General Comments: pt with eyes closed throughout and minor response to movement of limbs, no verbalization      Exercises General Exercises - Upper Extremity Shoulder Flexion: Both;Supine;10 reps;AAROM Elbow Flexion: AAROM;Both;10 reps;Supine (varied from aAROm to PROM) Elbow Extension: AAROM;Both;10 reps;Supine (varied from  PROM to [facility]) General Exercises - Lower Extremity Ankle Circles/Pumps: PROM;Both;10 reps;Supine Short Arc Quad: PROM;Both;10 reps;Supine Hip ABduction/ADduction: PROM;Both;10 reps;Supine Hip Flexion/Marching: PROM;Both;10 reps;Supine    General Comments        Pertinent Vitals/Pain Pain Assessment:  (CPOT= 1)    Home Living                      Prior Function            PT Goals (current goals can now be found in the care plan section) Progress towards PT goals: Not progressing toward goals - comment    Frequency    Min 2X/week      PT Plan Current plan remains appropriate    Co-evaluation              AM-PAC PT "6 Clicks" Mobility   Outcome Measure  Help needed turning from your back to your side while in a flat bed without using bedrails?: Total Help needed moving from lying on your back to sitting on the side of a flat bed without using bedrails?:  Total Help needed moving to and from a bed to a chair (including a wheelchair)?: Total Help needed standing up from a chair using your arms (e.g., wheelchair or bedside chair)?: Total Help needed to walk in hospital room?: Total Help needed climbing 3-5 steps with a railing? : Total 6 Click Score: 6    End of Session   Activity Tolerance: Patient limited by lethargy Patient left: in bed;with call bell/phone within reach;with bed alarm set Nurse Communication: Mobility status;Need for lift equipment PT Visit Diagnosis: Unsteadiness on feet (R26.81);Muscle weakness (generalized) (M62.81);Difficulty in walking, not elsewhere classified (R26.2)     Time: 4696-2952 PT Time Calculation (min) (ACUTE ONLY): 11 min  Charges:  $Therapeutic Exercise: 8-22 mins                     Yarnell Arvidson P, PT Acute Rehabilitation Services Pager: [phone] Office: [phone]    Sandy Salaam Iker Nuttall 01/26/2021, 8:47 AM

## 2021-01-26 NOTE — Progress Notes (Signed)
Tamara [name]   DOB:07/05/1976   XK#:[id number]   HFW#:[id number]  Assessment and Plan  This is a 45 year old female currently admitted due to malfunctioning hemodialysis catheter now found to have DIC of unclear etiology. DIC panel today with fibrinogen of 134, INR of 1.4, PTT of 43 seconds, platelet count of 45 K, no indication for cryo or FFP today. Blood cx drawn to identify any potential infection, no growth, she is on empiric cipro and flagyl given suspicion for intra abdominal infection since this was her chief complaint. Please consider obtaining DIC panel daily for monitoring while inpatient. Agree to consider comfort care if no improvement in clinical status.  Tamara Pike, MD 12/27/2020  3:54 PM  Subjective:   Pt not responsive at the time of my visit. Nursing team was trying to obtain rectal temperature. No change in ecchymosis,   Objective:  Vitals:   01/26/21 1600 01/26/21 1615  BP: (!) 111/55   Pulse: 95   Resp: 12   Temp:  (!) 94.5 F (34.7 C)  SpO2: 99%     Body mass index is 24.65 kg/m.  Intake/Output Summary (Last 24 hours) at 01/26/2021 1638 Last data filed at 01/26/2021 1600 Gross per 24 hour  Intake 1088.26 ml  Output --  Net 1088.26 ml     Pt in no acute distress, confused.             Ecchymosis on arm is about the same. No new ecchymosis.             NO oozing from lines. Trace edema extremities.  CBG (last 3)  Recent Labs    01/26/21 0734 01/26/21 1204 01/26/21 1614  GLUCAP 81 87 79     Labs:  Lab Results  Component Value Date   WBC 6.8 01/26/2021   HGB 8.2 (L) 01/26/2021   HCT 25.1 (L) 01/26/2021   MCV 83.4 01/26/2021   PLT 45 (L) 01/26/2021   NEUTROABS 4.9 01/26/2021    Urine Studies No results for input(s): UHGB, CRYS in the last 72 hours.  Invalid input(s): UACOL, UAPR, USPG, UPH, UTP, UGL, UKET, UBIL, UNIT, UROB, ULEU, UEPI, UWBC, URBC, [place], [place], [place], [place]  Basic Metabolic Panel: Recent Labs  Lab 01/20/21 [phone]  01/21/21 0400 01/22/21 0813 01/10/2021 1735 01/24/21 0014 01/24/21 0322 01/25/21 0250 01/26/21 0325  NA 139 138 142 141 139 139 139 141  K 2.7* 3.9 2.9* 2.9* 3.3* 3.1* 2.9* 3.3*  CL 103 104 108 108 106 105 107 109  CO2 25 23 23  17* 17* 16* 15* 13*  GLUCOSE 86 75 72 90 135* 123* 128* 93  BUN 19 13 19  21* 20 20 22* 23*  CREATININE 4.68* 3.14* 4.33* 5.26* 4.99* 5.12* 5.56* 5.92*  CALCIUM 7.1* 6.9* 7.0* 7.0* 7.0* 7.0* 7.0* 7.1*  MG 1.4* 1.5* 2.0  --   --  1.8  --  1.7  PHOS  --   --  1.8*  --   --   --   --   --    GFR Estimated Creatinine Clearance: 11 mL/min (A) (by C-G formula based on SCr of 5.92 mg/dL (H)). Liver Function Tests: Recent Labs  Lab 01/22/21 0813 01/24/2021 1735 01/24/21 0322 01/25/21 0250 01/26/21 0325  AST 23 41 37 27 23  ALT 21 33 33 32 34  ALKPHOS 61 56 56 61 71  BILITOT 1.8* 1.9* 2.0* 1.8* 1.6*  PROT 3.7* 3.8* 4.0* 3.7* 4.0*  ALBUMIN 1.7* 1.7* 1.8* 1.7* 1.8*  Recent Labs  Lab 01/22/21 2126  LIPASE 15   Recent Labs  Lab 01/22/21 1306  AMMONIA 23   Coagulation profile Recent Labs  Lab 01/22/21 0813 01/22/21 1600 01/26/2021 1314 01/24/21 1115 01/25/21 0250  INR 1.7* 1.6* 1.2 1.3* 1.4*    CBC: Recent Labs  Lab 01/22/21 0813 01/22/21 1600 12/30/2020 1314 01/24/21 0322 01/25/21 0250 01/26/21 0325  WBC 8.7  --  6.2 8.3 5.7 6.8  NEUTROABS 5.4  --   --  5.8 4.2 4.9  HGB 8.1*  --  7.0* 8.5* 8.2* 8.2*  HCT 22.2*  --  20.5* 22.9* 23.1* 25.1*  MCV 79.0*  --  81.3 81.2 81.6 83.4  PLT 30* 31* 25* 84* 41*  42* 45*   Cardiac Enzymes: No results for input(s): CKTOTAL, CKMB, CKMBINDEX, TROPONINI in the last 168 hours. BNP: Invalid input(s): POCBNP CBG: Recent Labs  Lab 01/25/21 2337 01/26/21 0306 01/26/21 0734 01/26/21 1204 01/26/21 1614  GLUCAP 84 84 81 87 79   D-Dimer Recent Labs    01/24/21 0322 01/25/21 0250  DDIMER 0.75* 1.02*   Hgb A1c No results for input(s): HGBA1C in the last 72 hours. Lipid Profile No results for  input(s): CHOL, HDL, LDLCALC, TRIG, CHOLHDL, LDLDIRECT in the last 72 hours. Thyroid function studies No results for input(s): TSH, T4TOTAL, T3FREE, THYROIDAB in the last 72 hours.  Invalid input(s): FREET3 Anemia work up [employer]    01/24/21 0322  FERRITIN 1,293*  RETICCTPCT 1.0   Microbiology Recent Results (from the past 240 hour(s))  SARS Coronavirus 2 by RT PCR (hospital order, performed in [facility] hospital lab) Nasopharyngeal Nasopharyngeal Swab     Status: Abnormal   Collection Time: 01/11/2021  8:29 PM   Specimen: Nasopharyngeal Swab  Result Value Ref Range Status   SARS Coronavirus 2 POSITIVE (A) NEGATIVE Final    Comment: RESULT CALLED TO, READ BACK BY AND VERIFIED WITH: C. TURNER 1/21 @ 2232 BY S. BEARD. (NOTE) SARS-CoV-2 target nucleic acids are DETECTED  SARS-CoV-2 RNA is generally detectable in upper respiratory specimens  during the acute phase of infection.  Positive results are indicative  of the presence of the identified virus, but do not rule out bacterial infection or co-infection with other pathogens not detected by the test.  Clinical correlation with patient history and  other diagnostic information is necessary to determine patient infection status.  The expected result is negative.  Fact Sheet for Patients:   StrictlyIdeas.no   Fact Sheet for Healthcare Providers:   BankingDealers.co.za    This test is not yet approved or cleared by the [place] FDA and  has been authorized for detection and/or diagnosis of SARS-CoV-2 by FDA under an Emergency Use Authorization (EUA).  This EUA will remain in effect (meaning this te st can be used) for the duration of  the COVID-19 declaration under Section 564(b)(1) of the Act, 21 U.S.C. section 360-bbb-3(b)(1), unless the authorization is terminated or revoked sooner.  Performed at [facility], [address] [place], [place] [address]   Culture, blood  (x 2)     Status: None (Preliminary result)   Collection Time: 01/22/21  9:26 PM   Specimen: BLOOD RIGHT HAND  Result Value Ref Range Status   Specimen Description BLOOD RIGHT HAND  Final   Special Requests AEROBIC BOTTLE ONLY Blood Culture adequate volume  Final   Culture   Final    NO GROWTH 3 DAYS Performed at [facility], [place] [address],  [place], [place] [address]    Report Status PENDING  Incomplete  Culture, blood (x 2)     Status: None (Preliminary result)   Collection Time: 01/22/21  9:26 PM   Specimen: BLOOD RIGHT ARM  Result Value Ref Range Status   Specimen Description BLOOD RIGHT ARM  Final   Special Requests AEROBIC BOTTLE ONLY Blood Culture adequate volume  Final   Culture   Final    NO GROWTH 3 DAYS Performed at [facility], [address], [place], [place] [address]    Report Status PENDING  Incomplete  MRSA PCR Screening     Status: None   Collection Time: 01/22/21  9:48 PM   Specimen: Nasopharyngeal  Result Value Ref Range Status   MRSA by PCR NEGATIVE NEGATIVE Final    Comment:        The GeneXpert MRSA Assay (FDA approved for NASAL specimens only), is one component of a comprehensive MRSA colonization surveillance program. It is not intended to diagnose MRSA infection nor to guide or monitor treatment for MRSA infections. Performed at [facility], [place] [address], [place], [place] [address]   Culture, blood (single)     Status: None (Preliminary result)   Collection Time: 01/08/2021  9:18 PM   Specimen: BLOOD  Result Value Ref Range Status   Specimen Description BLOOD SITE NOT SPECIFIED  Final   Special Requests   Final    BOTTLES DRAWN AEROBIC AND ANAEROBIC Blood Culture adequate volume   Culture   Final    NO GROWTH 3 DAYS Performed at [facility], [address], [place],  [address]    Report Status PENDING  Incomplete      Studies:  VAS [place] LOWER EXTREMITY VENOUS (DVT)  Result Date: 01/25/2021  Lower  Venous DVT Study Limitations: Bandages and Dialysis access in proximal right thigh, patient cooperation. Comparison Study: No prior study Performing Technologist: Sharion Dove RVS  Examination Guidelines: A complete evaluation includes B-mode imaging, spectral Doppler, color Doppler, and power Doppler as needed of all accessible portions of each vessel. Bilateral testing is considered an integral part of a complete examination. Limited examinations for reoccurring indications may be performed as noted. The reflux portion of the exam is performed with the patient in reverse Trendelenburg.  +---------+---------------+---------+-----------+----------+-------------------+ RIGHT    CompressibilityPhasicitySpontaneityPropertiesThrombus Aging      +---------+---------------+---------+-----------+----------+-------------------+ CFV                                                   Not well visualized +---------+---------------+---------+-----------+----------+-------------------+ SFJ                                                   Not well visualized +---------+---------------+---------+-----------+----------+-------------------+ FV Prox  Full                                                             +---------+---------------+---------+-----------+----------+-------------------+ FV Mid   Full                                                             +---------+---------------+---------+-----------+----------+-------------------+  FV DistalFull           Yes      Yes                                      +---------+---------------+---------+-----------+----------+-------------------+ PFV                                                   Not well visualized +---------+---------------+---------+-----------+----------+-------------------+ POP                     Yes      Yes                                       +---------+---------------+---------+-----------+----------+-------------------+ PTV      Full                                                             +---------+---------------+---------+-----------+----------+-------------------+ PERO     Full                                                             +---------+---------------+---------+-----------+----------+-------------------+   +----+---------------+---------+-----------+----------+--------------+ LEFTCompressibilityPhasicitySpontaneityPropertiesThrombus Aging +----+---------------+---------+-----------+----------+--------------+ CFV Full           Yes      Yes                                 +----+---------------+---------+-----------+----------+--------------+     Summary: RIGHT: - There is no evidence of deep vein thrombosis in the lower extremity. However, portions of this examination were limited- see technologist comments above.  LEFT: - No evidence of common femoral vein obstruction.  *See table(s) above for measurements and observations. Electronically signed by Jamelle Haring on 01/25/2021 at 5:53:40 PM.    Final

## 2021-01-26 NOTE — Progress Notes (Signed)
Patient ID: Tamara [name], female   DOB: 05-27-1976, 45 y.o.   MRN: [id number] S: Pt remains confused and minimally cooperative with swallowing test at bedside. O:BP (!) 100/56   Pulse 89   Temp (!) 97.4 F (36.3 C) (Axillary)   Resp 13   Ht 5\' 5"  (1.651 m)   Wt 67.2 kg   SpO2 99%   BMI 24.65 kg/m   Intake/Output Summary (Last 24 hours) at 01/26/2021 0957 Last data filed at 01/26/2021 0900 Gross per 24 hour  Intake 1087.45 ml  Output -  Net 1087.45 ml   Intake/Output: I/O last 3 completed shifts: In: 1612.5 [I.V.:720; IV Piggyback:892.6] Out: -   Intake/Output this shift:  Total I/O In: 40.1 [I.V.:40.1] Out: -  Weight change:  Gen: chronically ill-appearing, confused CVS: RRR Resp: cta Abd: +BS, soft, NT/ND Ext: trace edema, right femoral HD catheter  Recent Labs  Lab 01/21/21 0400 01/22/21 0813 01/20/2021 1735 01/24/21 0014 01/24/21 0322 01/25/21 0250 01/26/21 0325  NA 138 142 141 139 139 139 141  K 3.9 2.9* 2.9* 3.3* 3.1* 2.9* 3.3*  CL 104 108 108 106 105 107 109  CO2 23 23 17* 17* 16* 15* 13*  GLUCOSE 75 72 90 135* 123* 128* 93  BUN 13 19 21* 20 20 22* 23*  CREATININE 3.14* 4.33* 5.26* 4.99* 5.12* 5.56* 5.92*  ALBUMIN  --  1.7* 1.7*  --  1.8* 1.7* 1.8*  CALCIUM 6.9* 7.0* 7.0* 7.0* 7.0* 7.0* 7.1*  PHOS  --  1.8*  --   --   --   --   --   AST  --  23 41  --  37 27 23  ALT  --  21 33  --  33 32 34   Liver Function Tests: Recent Labs  Lab 01/24/21 0322 01/25/21 0250 01/26/21 0325  AST 37 27 23  ALT 33 32 34  ALKPHOS 56 61 71  BILITOT 2.0* 1.8* 1.6*  PROT 4.0* 3.7* 4.0*  ALBUMIN 1.8* 1.7* 1.8*   Recent Labs  Lab 01/22/21 2126  LIPASE 15   Recent Labs  Lab 01/22/21 1306  AMMONIA 23   CBC: Recent Labs  Lab 01/22/21 0813 01/22/21 1600 01/26/2021 1314 01/24/21 0322 01/25/21 0250 01/26/21 0325  WBC 8.7  --  6.2 8.3 5.7 6.8  NEUTROABS 5.4  --   --  5.8 4.2 4.9  HGB 8.1*  --  7.0* 8.5* 8.2* 8.2*  HCT 22.2*  --  20.5* 22.9* 23.1* 25.1*  MCV  79.0*  --  81.3 81.2 81.6 83.4  PLT 30*   < > 25* 84* 41*  42* 45*   < > = values in this interval not displayed.   Cardiac Enzymes: No results for input(s): CKTOTAL, CKMB, CKMBINDEX, TROPONINI in the last 168 hours. CBG: Recent Labs  Lab 01/25/21 1117 01/25/21 1601 01/25/21 1918 01/26/21 0306 01/26/21 0734  GLUCAP 90 88 87 84 81    Iron Studies:  Recent Labs    01/24/21 0322  FERRITIN 1,293*   Studies/Results: VAS [place] LOWER EXTREMITY VENOUS (DVT)  Result Date: 01/25/2021  Lower Venous DVT Study Limitations: Bandages and Dialysis access in proximal right thigh, patient cooperation. Comparison Study: No prior study Performing Technologist: Sharion Dove RVS  Examination Guidelines: A complete evaluation includes B-mode imaging, spectral Doppler, color Doppler, and power Doppler as needed of all accessible portions of each vessel. Bilateral testing is considered an integral part of a complete examination. Limited examinations for reoccurring indications  may be performed as noted. The reflux portion of the exam is performed with the patient in reverse Trendelenburg.  +---------+---------------+---------+-----------+----------+-------------------+ RIGHT    CompressibilityPhasicitySpontaneityPropertiesThrombus Aging      +---------+---------------+---------+-----------+----------+-------------------+ CFV                                                   Not well visualized +---------+---------------+---------+-----------+----------+-------------------+ SFJ                                                   Not well visualized +---------+---------------+---------+-----------+----------+-------------------+ FV Prox  Full                                                             +---------+---------------+---------+-----------+----------+-------------------+ FV Mid   Full                                                              +---------+---------------+---------+-----------+----------+-------------------+ FV DistalFull           Yes      Yes                                      +---------+---------------+---------+-----------+----------+-------------------+ PFV                                                   Not well visualized +---------+---------------+---------+-----------+----------+-------------------+ POP                     Yes      Yes                                      +---------+---------------+---------+-----------+----------+-------------------+ PTV      Full                                                             +---------+---------------+---------+-----------+----------+-------------------+ PERO     Full                                                             +---------+---------------+---------+-----------+----------+-------------------+   +----+---------------+---------+-----------+----------+--------------+ LEFTCompressibilityPhasicitySpontaneityPropertiesThrombus Aging +----+---------------+---------+-----------+----------+--------------+ CFV Full  Yes      Yes                                 +----+---------------+---------+-----------+----------+--------------+     Summary: RIGHT: - There is no evidence of deep vein thrombosis in the lower extremity. However, portions of this examination were limited- see technologist comments above.  LEFT: - No evidence of common femoral vein obstruction.  *See table(s) above for measurements and observations. Electronically signed by Jamelle Haring on 01/25/2021 at 5:53:40 PM.    Final    . chlorhexidine  15 mL Mouth Rinse BID  . Chlorhexidine Gluconate Cloth  6 each Topical Q0600  . darbepoetin (ARANESP) injection - DIALYSIS  150 mcg Intravenous Q Fri-HD  . hydrocerin   Topical BID  . levothyroxine  25 mcg Oral Q0600  . mouth rinse  15 mL Mouth Rinse q12n4p  . midodrine  10 mg Oral TID  . sodium chloride  flush  10-40 mL Intracatheter Q12H  . vancomycin variable dose per unstable renal function (pharmacist dosing)   Does not apply See admin instructions    BMET    Component Value Date/Time   NA 141 01/26/2021 0325   K 3.3 (L) 01/26/2021 0325   CL 109 01/26/2021 0325   CO2 13 (L) 01/26/2021 0325   GLUCOSE 93 01/26/2021 0325   BUN 23 (H) 01/26/2021 0325   CREATININE 5.92 (H) 01/26/2021 0325   CREATININE 0.99 02/08/2017 1129   CALCIUM 7.1 (L) 01/26/2021 0325   GFRNONAA 8 (L) 01/26/2021 0325   GFRAA 8 (L) 09/12/2020 1905   CBC    Component Value Date/Time   WBC 6.8 01/26/2021 0325   RBC 3.01 (L) 01/26/2021 0325   HGB 8.2 (L) 01/26/2021 0325   HCT 25.1 (L) 01/26/2021 0325   PLT 45 (L) 01/26/2021 0325   MCV 83.4 01/26/2021 0325   MCH 27.2 01/26/2021 0325   MCHC 32.7 01/26/2021 0325   RDW 17.6 (H) 01/26/2021 0325   LYMPHSABS 1.6 01/26/2021 0325   MONOABS 0.2 01/26/2021 0325   EOSABS 0.0 01/26/2021 0325   BASOSABS 0.0 01/26/2021 0325     Assessment/Plan:  1. Severe DIC- Oncology following 2. Shock hypotension- empirically started on IV cefepime 3. ESRD- was on HD at [facility], however she is currently not suitable for outpatient dialysis given her poor functional and cognitive status as well as multiple comorbidities.  Difficult situation as she is a ward of the state and she does not have the capacity to understand the consequences.  Last HD was 01/22/21.  Recommend palliative care consult to transition to comfort care as she is not a candidate for outpatient dialysis.  No indication for dialysis at this time and recommend not resuming. 4. Anemia- of CKD.  Transfuse prn 5. Developmental delay/AMS 6. Disposition- poor overall prognosis and recommend palliative care consult and transition to comfort measures.   Donetta Potts, MD [employer] [phone]

## 2021-01-26 NOTE — Evaluation (Signed)
Clinical/Bedside Swallow Evaluation Patient Details  Name: Tamara [name] MRN: [id number] Date of Birth: 03-29-1976  Today's Date: 01/26/2021 Time: SLP Start Time (ACUTE ONLY): 0940 SLP Stop Time (ACUTE ONLY): 0954 SLP Time Calculation (min) (ACUTE ONLY): 14 min  Past Medical History:  Past Medical History:  Diagnosis Date  . Abnormal uterine bleeding (AUB) 01/26/2016  . Acute pancreatitis without necrosis or infection, unspecified   . Anemia in chronic kidney disease (CKD)   . Anxiety and depression    mentally slow  . Cardiac arrhythmia   . Chronic abdominal pain   . COVID-19   . Dependence on renal dialysis ([place])   . Depression   . Gastro-esophageal reflux disease without esophagitis   . Generalized anxiety disorder   . Gout   . Hematuria 01/26/2016  . Hyperlipidemia   . Hypertension   . Hypomagnesemia   . Leg pain   . Major depressive disorder   . Obesity   . Ovarian cyst 02/10/2016  . Personal history of sudden cardiac arrest   . Pseudocyst of pancreas   . PUD (peptic ulcer disease)   . Pure hypercholesterolemia   . Reflux   . Renal disorder   . S/P colonoscopy 03/15/11   friable anal canal  . Sebaceous cyst of labia 01/26/2016  . Tachycardia   . Unspecified psychosis not due to a substance or known physiological condition ([place])   . Yeast vaginitis 09/19/2013   Past Surgical History:  Past Surgical History:  Procedure Laterality Date  . INSERTION OF DIALYSIS CATHETER Right 01/02/2021   Procedure: INSERTION OF RIGHT FEMORAL TUNNELED DIALYSIS CATHETER;  Surgeon: Elam Dutch, MD;  Location: [facility];  Service: Vascular;  Laterality: Right;  . IR FLUORO GUIDE CV LINE RIGHT  12/05/2020  . IR FLUORO GUIDE CV LINE RIGHT  12/12/2020  . IR FLUORO GUIDE CV LINE RIGHT  12/29/2020  . IR INTRAVASCULAR ULTRASOUND NON CORONARY  12/29/2020  . IR PTA VENOUS ADDL EXCEPT DIALYSIS CIRCUIT  12/29/2020  . IR PTA VENOUS EXCEPT DIALYSIS CIRCUIT  12/05/2020  . IR US GUIDE VASC ACCESS RIGHT   12/12/2020  . REMOVAL OF A DIALYSIS CATHETER Right 01/20/2021   Procedure: REMOVAL OF RIGHT CHEST TUNNELED DIALYSIS CATHETER;  Surgeon: Elam Dutch, MD;  Location: [facility] OR;  Service: Vascular;  Laterality: Right;  . TOOTH EXTRACTION N/A 06/03/2014   Procedure: EXTRACTION WISDOM TEETH;  Surgeon: Gae Bon, DDS;  Location: [facility];  Service: Oral Surgery;  Laterality: N/A;  . TUBAL LIGATION    . VENOGRAM Bilateral 01/09/2021   Procedure: BILATERAL CENTRAL VENOGRAM;  Surgeon: Elam Dutch, MD;  Location: [facility] OR;  Service: Vascular;  Laterality: Bilateral;   HPI:  Pt is a 45 y.o. female with PMH ESRD (on HD), developmental delay, HTN, COVID ARDS (01/2020), depression, axniety. Pt is a SNF resident and was admitted 01/12/2021 with clogged R subclavian HD catheter; pt incidentally tested positive for COVID-19. Pt is s/p placement of non tunneled R femoral catheter 1/22. And tunneled Rt fem catheter on 1/28. CT head was negative for acute changes. Pt diagnosed with metabolic encephalopathy. Per referring MD's note on 1/30, "Patient's diet was advanced by ICU to regular diet although at present patient is not able to follow commands and therefore not safe to swallow his regular consistency diet." A dysphagia 1 diet with honey thick liquids was initiated and SLP consulted for evaluation. Prognosis judged to be poor and it was recommended that she be transitioned to comfort  care.   Assessment / Plan / Recommendation Clinical Impression  Pt was seen for bedside swallow evaluation. Pt was lethargic and repositioned with assistance from Tamara [name], Tamara [name]. Pt exhibited difficulty maintaining an adequate upright position for p.o. intake. She required physical assistance throughout the evaluation to maintain her head and truck position to eliminate a left lateral lean. She did not follow the necessary commands for an oral mechanism exam. Pt demonstrated symptoms of oral phase dysphagia characterized by reduced bolus  awareness, reduced labial seal and stripping with anterior spillage of liquids and solids, and moderate oral residue with puree solids. Pt frequently required cueing to swallow boluses and anterior spillage was noted across consistencies. An oral and/or pharyngeal delay is suspected. Pt's level of alertness, cognitive impairments, reduced bolus awareness, dependence for feeding, and positioning place her at increased aspiration risk. Pt may have dysphagia 1 solids and honey thick liquids if pt's goals of care become comfort. Otherwise, an NPO status with short-term non-oral alimentation is recommended. Tamara [name], has been advised of pt's performance and recommendations. SLP will follow pt pending decision on goals of care. SLP Visit Diagnosis: Dysphagia, unspecified (R13.10)    Aspiration Risk  Moderate aspiration risk;Severe aspiration risk    Diet Recommendation NPO;Alternative means - temporary (NPO with short-term non-oral alimentation vs dysphagia 1 and honey thick liquids)   Liquid Administration via: Spoon Medication Administration: Via alternative means Supervision: Full supervision/cueing for compensatory strategies Compensations: Slow rate;Small sips/bites    Other  Recommendations Oral Care Recommendations: Oral care QID Other Recommendations: Order thickener from pharmacy   Follow up Recommendations  (TBD)      Frequency and Duration min 2x/week  2 weeks       Prognosis Prognosis for Safe Diet Advancement: Guarded Barriers to Reach Goals: Cognitive deficits;Severity of deficits;Behavior      Swallow Study   General Date of Onset: 01/25/21 HPI: Pt is a 45 y.o. female with PMH ESRD (on HD), developmental delay, HTN, COVID ARDS (01/2020), depression, axniety. Pt is a SNF resident and was admitted 01/25/2021 with clogged R subclavian HD catheter; pt incidentally tested positive for COVID-19. Pt is s/p placement of non tunneled R femoral catheter 1/22. And tunneled Rt fem  catheter on 1/28. CT head was negative for acute changes. Pt diagnosed with metabolic encephalopathy. Per referring MD's note on 1/30, "Patient's diet was advanced by ICU to regular diet although at present patient is not able to follow commands and therefore not safe to swallow his regular consistency diet." A dysphagia 1 diet with honey thick liquids was initiated and SLP consulted for evaluation. Prognosis judged to be poor and it was recommended that she be transitioned to comfort care. Type of Study: Bedside Swallow Evaluation Previous Swallow Assessment: None Diet Prior to this Study: Dysphagia 1 (puree);Honey-thick liquids Temperature Spikes Noted: No Respiratory Status: Room air History of Recent Intubation: No Behavior/Cognition: Lethargic/Drowsy;Requires cueing;Doesn't follow directions Oral Cavity Assessment:  (Difficult to assess) Oral Care Completed by SLP: No Oral Cavity - Dentition:  (Some natural dentition noted. \) Self-Feeding Abilities: Total assist Patient Positioning: Postural control interferes with function;Upright in bed (required support from SLP and RN to maintain upright positioning) Baseline Vocal Quality: Low vocal intensity Volitional Cough: Cognitively unable to elicit Volitional Swallow: Unable to elicit    Oral/Motor/Sensory Function Overall Oral Motor/Sensory Function: Other (comment) (UTA)   Ice Chips Ice chips: Impaired Presentation: Spoon Oral Phase Impairments: Impaired mastication;Poor awareness of bolus Oral Phase Functional Implications: Right anterior spillage;Left anterior spillage  Thin Liquid Thin Liquid: Impaired Presentation: Straw Oral Phase Impairments: Poor awareness of bolus;Reduced labial seal Oral Phase Functional Implications: Right anterior spillage;Left anterior spillage Pharyngeal  Phase Impairments:  (Pt did not swallow any boluses)    Nectar Thick Nectar Thick Liquid: Not tested   Honey Thick Honey Thick Liquid:  Impaired Presentation: Spoon Oral Phase Impairments: Poor awareness of bolus;Reduced labial seal;Reduced lingual movement/coordination Oral Phase Functional Implications: Right anterior spillage;Left anterior spillage;Prolonged oral transit   Puree Puree: Impaired Presentation: Spoon Oral Phase Impairments: Poor awareness of bolus;Reduced lingual movement/coordination;Reduced labial seal Oral Phase Functional Implications: Right anterior spillage;Left anterior spillage;Oral residue   Solid     Solid: Not tested     Tamara [name] I. Hardin Negus, [place], Tamara [place] Office number [phone] Pager [phone]  Tamara [name] 01/26/2021,10:44 AM

## 2021-01-26 NOTE — Progress Notes (Signed)
[facility] Progress Note  Patient: Tamara [name]    VEL:[id number]  DOA: 01/23/2021     Date of Service: the patient was seen and examined on 01/26/2021  Brief hospital course: 45 year old with history of ESRD HD, developmental delay/diminished mental capacity, HTN, COVID-19 with ARDS February 2021, depression, anxiety admitted for malfunction of hemodialysis catheter initially at [facility]. 1/21 presents with not receiving HD for 3 consecutive treatment due to an occluded catheter.  TDC treated with TPA dwell.incidental finding of COVID-19 infection, largely asymptomatic.  Tested positive on 1/11. 1/22 3 PRBC transfused.  RIJ TDC malfunctioning during HD.  General surgery was consulted for placement of right femoral HD cath.  HD completed. 1/23 IR consult-failed numerous recent catheter exchange and intervention, recent SVC angioplasty.  Note mentions patient clearly failing long-term TDC use.  Remains nauseated Transferred to [facility] for vascular surgery evaluation. 1/24 vascular surgery consult 1/25 vein mapping performed.  Platelets continues to worsen initially thought to be dilution. 1/27 hematology consulted.  Suspected to have DIC.  Transferred to progressive care unit.  2 units cryoprecipitate received. 1/28 PCCM consulted.  Transferred to ICU for pressors due to hypotension.  Two-physician DNR by PCCM and nephrology. 1/30 transfer back to floor.  Transition back to progressive care unit. 1/31 based on the discussion with legal guardian CODE STATUS changed from DNR to full code until they reviewed the case  Currently plan is engage in goals of care conversation with legal guardian, perform work-up requested by the legal guardian.  Assessment and Plan: Severe thrombocytopenia. DIC Anemia of chronic kidney disease with acute component. Hematology consulted due to ongoing worsening platelets. HIT panel unremarkable. Work-up currently supports diagnosis of  DIC given low fibrinogen level and scattered schistocytes. Received cryoprecipitate.  2 units. No role of anticoagulation for now  currently no evidence of thrombosis. Transfuse for hemoglobin less than 7, platelets less than 20. Fibrinogen level remains stable.  Suspected sepsis. Shock/hypotension. Hypothermia Given current diagnosis of DIC patient was empirically started on IV cefepime on 01/22/2021. Cultures were so far negative. Lactic acid was elevated to 6. Repeat lactic acid were not able to be obtained due to poor IV access despite multiple phlebotomy attempt. MRSA PCR was negative. Flagyl and vancomycin was added as well. Currently on [facility]. Continue with IV antibiotics, complete at least 7-day treatment course. MRSA PCR negative therefore vancomycin will be discontinued. Continue Flagyl.  ESRD on HD Nephrology currently following. Receiving hemodialysis via temporary catheter. Currently no indication for acute hemodialysis. Per nephrology outpatient hemodialysis is not recommended for long-term given her lack of understanding of hemodialysis as well as multiple comorbidities and frequent issues with vascular access.  Hypokalemia, hypomagnesemia, hypophosphatemia. Currently being replaced. Monitor.  Hypoglycemia From poor oral intake. Continue to monitor. On D10 drip. Monitor CBG every 4 hours.  Essential hypertension. Chronic hypotension. On midodrine 3 times a week. Unable to take p.o.  Blood pressure stable.  Monitor.  COVID-19 infection, asymptomatic Initially tested positive-01/06/2021. Isolation discontinued Continue supportive care. Saturating well on room air.  Chronic pancreatitis Intractable nausea and vomiting. On Creon.  Hold  Hypothyroidism, currently sick euthyroid syndrome Free T4 elevated, TSH also elevated. Currently no therapy. Recheck in 4 weeks.  Acute metabolic encephalopathy Progressively confused and lethargic. CT  head unremarkable. Likely consistent with DIC. Although etiology still not clear. Discontinue psychotropic medication. Monitor on telemetry.  Speech therapy consulted.  Recommend NPO.  Depression. On Zoloft at home. Currently on hold. Monitor.  Bilateral  upper extremity bruising. Likely from thrombocytopenia and IV access. Currently stable..  Elevated INR. 1 dose of IV vitamin K.  Goals of care conversation. Patient has a legal guardian with [place]. Any procedures or any intervention that requires a consent DHS must be notified. Discussed with social worker on call for [facility] and recommended the patient is a poor candidate for hemodialysis outpatient and currently with multiple comorbidities with ongoing poor p.o. intake with very poor prognosis. Recommended transitioning to comfort care for this patient.  Diet: NPO.  Unable to swallow safely.  Recommended comfort care versus feeding tube to legal guardian. DVT Prophylaxis:   SCDs Start: 01/17/21 0001    Advance goals of care discussion: Full code  Family Communication: no family was present at bedside, at the time of interview.  Opportunity was given to ask question and all questions were answered satisfactorily.   Disposition:  Status is: Inpatient  Remains inpatient appropriate because: Hemodynamically unstable as well as critically ill.  Unable to take anything p.o.  HD dependent but not a good candidate for outpatient HD  Dispo:  Patient From: Home  Planned Disposition: To be determined  Expected discharge date: 01/27/2021  Medically stable for discharge: No     Subjective: Minimally responsive.  Noncommunicative.  Occasional purposeful movement.  Remains lethargic.  No diarrhea.  No nausea.  Physical Exam:  General: Appear in moderate distress, bilateral upper extremity bruising, no other rash; Oral Mucosa Clear, dry. no Abnormal Neck Mass Or lumps, Conjunctiva normal  Cardiovascular: S1 and S2 Present, no  Murmur, Respiratory: increased respiratory effort, Bilateral Air entry present and bilateral  Crackles, no wheezes Abdomen: Bowel Sound present, Soft and difficult to assess tenderness Extremities: no Pedal edema Neurology: lethargic and not oriented to time, place, and person affect unresponsive.  Nonverbal, spontaneously moving all extremities, no new focal deficit Gait not checked due to patient safety concerns  Vitals:   01/26/21 1615 01/26/21 1700 01/26/21 1800 01/26/21 2000  BP:  (!) 101/53 (!) 95/59 (!) 82/60  Pulse:  97 (!) 102 (!) 109  Resp:  12 13 (!) 23  Temp: (!) 94.5 F (34.7 C)   97.7 F (36.5 C)  TempSrc: Rectal   Axillary  SpO2:  99% 98% 98%  Weight:      Height:        Intake/Output Summary (Last 24 hours) at 01/26/2021 2143 Last data filed at 01/26/2021 1800 Gross per 24 hour  Intake 780.06 ml  Output --  Net 780.06 ml   Filed Weights   01/21/21 0145 01/22/21 2323 01/20/2021 0622  Weight: 66.4 kg 67.6 kg 67.2 kg    Data Reviewed: I have personally reviewed and interpreted daily labs, tele strips, imaging. I reviewed all nursing notes, pharmacy notes, vitals, pertinent old records I have discussed plan of care as described above with RN and patient/family.  CBC: Recent Labs  Lab 01/22/21 0813 01/22/21 1600 01/17/2021 1314 01/24/21 0322 01/25/21 0250 01/26/21 0325  WBC 8.7  --  6.2 8.3 5.7 6.8  NEUTROABS 5.4  --   --  5.8 4.2 4.9  HGB 8.1*  --  7.0* 8.5* 8.2* 8.2*  HCT 22.2*  --  20.5* 22.9* 23.1* 25.1*  MCV 79.0*  --  81.3 81.2 81.6 83.4  PLT 30* 31* 25* 84* 41*  42* 45*   Basic Metabolic Panel: Recent Labs  Lab 01/20/21 0338 01/21/21 0400 01/22/21 0813 01/22/2021 1735 01/24/21 0014 01/24/21 0322 01/25/21 0250 01/26/21 0325  NA 139 138 142  141 139 139 139 141  K 2.7* 3.9 2.9* 2.9* 3.3* 3.1* 2.9* 3.3*  CL 103 104 108 108 106 105 107 109  CO2 25 23 23  17* 17* 16* 15* 13*  GLUCOSE 86 75 72 90 135* 123* 128* 93  BUN 19 13 19  21* 20 20 22*  23*  CREATININE 4.68* 3.14* 4.33* 5.26* 4.99* 5.12* 5.56* 5.92*  CALCIUM 7.1* 6.9* 7.0* 7.0* 7.0* 7.0* 7.0* 7.1*  MG 1.4* 1.5* 2.0  --   --  1.8  --  1.7  PHOS  --   --  1.8*  --   --   --   --   --     Studies: No results found.  Scheduled Meds: . chlorhexidine  15 mL Mouth Rinse BID  . Chlorhexidine Gluconate Cloth  6 each Topical Q0600  . darbepoetin (ARANESP) injection - DIALYSIS  150 mcg Intravenous Q Fri-HD  . hydrocerin   Topical BID  . levothyroxine  25 mcg Oral Q0600  . mouth rinse  15 mL Mouth Rinse q12n4p  . midodrine  10 mg Oral TID  . sodium chloride flush  10-40 mL Intracatheter Q12H   Continuous Infusions: . sodium chloride    . ceFEPime (MAXIPIME) IV 1 g (01/26/21 2002)  . dextrose 20 mL/hr at 01/26/21 2002  . famotidine (PEPCID) IV Stopped (01/25/21 2219)  . metronidazole 500 mg (01/26/21 1801)   PRN Meds: acetaminophen **OR** acetaminophen, dextromethorphan-guaiFENesin, ondansetron (ZOFRAN) IV, Resource ThickenUp Clear, sodium chloride flush  Time spent: 35 minutes  Author: Berle Mull, MD [facility] 01/26/2021 9:43 PM  To reach On-call, see care teams to locate the attending and reach out via [email or web address]. Between 7PM-7AM, please contact night-coverage If you still have difficulty reaching the attending provider, please page the [facility] (Director on Call) for [facility] on amion for assistance.

## 2021-01-26 NOTE — Progress Notes (Signed)
  Progress Note    01/26/2021 7:49 AM 3 Days Post-Op  Subjective:  Nonverbal this morning   Vitals:   01/26/21 0508 01/26/21 0600  BP: 92/61 (!) 94/45  Pulse: 94 93  Resp: 14 14  Temp:    SpO2: 99% 100%   Physical Exam: Lungs:  Non labored Extremities:  Symmetrical radial pulses Neurologic: non verbal  CBC    Component Value Date/Time   WBC 6.8 01/26/2021 0325   RBC 3.01 (L) 01/26/2021 0325   HGB 8.2 (L) 01/26/2021 0325   HCT 25.1 (L) 01/26/2021 0325   PLT 45 (L) 01/26/2021 0325   MCV 83.4 01/26/2021 0325   MCH 27.2 01/26/2021 0325   MCHC 32.7 01/26/2021 0325   RDW 17.6 (H) 01/26/2021 0325   LYMPHSABS 1.6 01/26/2021 0325   MONOABS 0.2 01/26/2021 0325   EOSABS 0.0 01/26/2021 0325   BASOSABS 0.0 01/26/2021 0325    BMET    Component Value Date/Time   NA 141 01/26/2021 0325   K 3.3 (L) 01/26/2021 0325   CL 109 01/26/2021 0325   CO2 13 (L) 01/26/2021 0325   GLUCOSE 93 01/26/2021 0325   BUN 23 (H) 01/26/2021 0325   CREATININE 5.92 (H) 01/26/2021 0325   CREATININE 0.99 02/08/2017 1129   CALCIUM 7.1 (L) 01/26/2021 0325   GFRNONAA 8 (L) 01/26/2021 0325   GFRAA 8 (L) 09/12/2020 1905    INR    Component Value Date/Time   INR 1.4 (H) 01/25/2021 0250     Intake/Output Summary (Last 24 hours) at 01/26/2021 0749 Last data filed at 01/26/2021 0600 Gross per 24 hour  Intake 1087.18 ml  Output --  Net 1087.18 ml     Assessment/Plan:  45 y.o. female is s/p [facility] exchange 3 Days Post-Op   Nephrology and primary not recommending outpatient dialysis given comorbidities and lack of understanding   Plans noted for palliative care consultation Vascular will hold off on permanent dialysis access for now; re-consult if needed   Dagoberto Ligas, PA-C [facility] [phone] 01/26/2021 7:49 AM

## 2021-01-27 ENCOUNTER — Inpatient Hospital Stay (HOSPITAL_COMMUNITY): Payer: Medicare Other

## 2021-01-27 DIAGNOSIS — D65 Disseminated intravascular coagulation [defibrination syndrome]: Secondary | ICD-10-CM | POA: Diagnosis not present

## 2021-01-27 DIAGNOSIS — Z515 Encounter for palliative care: Secondary | ICD-10-CM | POA: Diagnosis not present

## 2021-01-27 LAB — PREPARE CRYOPRECIPITATE
Unit division: 0
Unit division: 0

## 2021-01-27 LAB — CBC WITH DIFFERENTIAL/PLATELET
Abs Immature Granulocytes: 0.05 10*3/uL (ref 0.00–0.07)
Basophils Absolute: 0 10*3/uL (ref 0.0–0.1)
Basophils Relative: 0 %
Eosinophils Absolute: 0 10*3/uL (ref 0.0–0.5)
Eosinophils Relative: 0 %
HCT: 22.8 % — ABNORMAL LOW (ref 36.0–46.0)
Hemoglobin: 7.9 g/dL — ABNORMAL LOW (ref 12.0–15.0)
Immature Granulocytes: 1 %
Lymphocytes Relative: 24 %
Lymphs Abs: 1.3 10*3/uL (ref 0.7–4.0)
MCH: 29 pg (ref 26.0–34.0)
MCHC: 34.6 g/dL (ref 30.0–36.0)
MCV: 83.8 fL (ref 80.0–100.0)
Monocytes Absolute: 0.1 10*3/uL (ref 0.1–1.0)
Monocytes Relative: 2 %
Neutro Abs: 4 10*3/uL (ref 1.7–7.7)
Neutrophils Relative %: 73 %
Platelets: 47 10*3/uL — ABNORMAL LOW (ref 150–400)
RBC: 2.72 MIL/uL — ABNORMAL LOW (ref 3.87–5.11)
RDW: 18.2 % — ABNORMAL HIGH (ref 11.5–15.5)
WBC: 5.5 10*3/uL (ref 4.0–10.5)
nRBC: 0 % (ref 0.0–0.2)

## 2021-01-27 LAB — COMPREHENSIVE METABOLIC PANEL
ALT: 35 U/L (ref 0–44)
AST: 20 U/L (ref 15–41)
Albumin: 1.7 g/dL — ABNORMAL LOW (ref 3.5–5.0)
Alkaline Phosphatase: 67 U/L (ref 38–126)
Anion gap: 21 — ABNORMAL HIGH (ref 5–15)
BUN: 25 mg/dL — ABNORMAL HIGH (ref 6–20)
CO2: 11 mmol/L — ABNORMAL LOW (ref 22–32)
Calcium: 7 mg/dL — ABNORMAL LOW (ref 8.9–10.3)
Chloride: 109 mmol/L (ref 98–111)
Creatinine, Ser: 6.31 mg/dL — ABNORMAL HIGH (ref 0.44–1.00)
GFR, Estimated: 8 mL/min — ABNORMAL LOW (ref >60)
Glucose, Bld: 70 mg/dL (ref 70–99)
Potassium: 3.5 mmol/L (ref 3.5–5.1)
Sodium: 141 mmol/L (ref 135–145)
Total Bilirubin: 1.6 mg/dL — ABNORMAL HIGH (ref 0.3–1.2)
Total Protein: 3.8 g/dL — ABNORMAL LOW (ref 6.5–8.1)

## 2021-01-27 LAB — VANCOMYCIN, RANDOM: Vancomycin Rm: 17

## 2021-01-27 LAB — POCT I-STAT 7, (LYTES, BLD GAS, ICA,H+H)
Acid-base deficit: 13 mmol/L — ABNORMAL HIGH (ref 0.0–2.0)
Bicarbonate: 11.5 mmol/L — ABNORMAL LOW (ref 20.0–28.0)
Calcium, Ion: 0.96 mmol/L — ABNORMAL LOW (ref 1.15–1.40)
HCT: 22 % — ABNORMAL LOW (ref 36.0–46.0)
Hemoglobin: 7.5 g/dL — ABNORMAL LOW (ref 12.0–15.0)
O2 Saturation: 97 %
Patient temperature: 98.4
Potassium: 3.5 mmol/L (ref 3.5–5.1)
Sodium: 141 mmol/L (ref 135–145)
TCO2: 12 mmol/L — ABNORMAL LOW (ref 22–32)
pCO2 arterial: 22.8 mmHg — ABNORMAL LOW (ref 32.0–48.0)
pH, Arterial: 7.309 — ABNORMAL LOW (ref 7.350–7.450)
pO2, Arterial: 94 mmHg (ref 83.0–108.0)

## 2021-01-27 LAB — BPAM CRYOPRECIPITATE
Blood Product Expiration Date: 202202011440
Blood Product Expiration Date: 202202011440
Unit Type and Rh: 5100
Unit Type and Rh: 5100

## 2021-01-27 LAB — DIC (DISSEMINATED INTRAVASCULAR COAGULATION)PANEL
D-Dimer, Quant: 1.29 ug/mL-FEU — ABNORMAL HIGH (ref 0.00–0.50)
Fibrinogen: 103 mg/dL — ABNORMAL LOW (ref 210–475)
INR: 1.6 — ABNORMAL HIGH (ref 0.8–1.2)
Platelets: 47 10*3/uL — ABNORMAL LOW (ref 150–400)
Prothrombin Time: 18 seconds — ABNORMAL HIGH (ref 11.4–15.2)
Smear Review: NONE SEEN
aPTT: 43 seconds — ABNORMAL HIGH (ref 24–36)

## 2021-01-27 LAB — GLUCOSE, CAPILLARY
Glucose-Capillary: 133 mg/dL — ABNORMAL HIGH (ref 70–99)
Glucose-Capillary: 61 mg/dL — ABNORMAL LOW (ref 70–99)
Glucose-Capillary: 95 mg/dL (ref 70–99)
Glucose-Capillary: 94 mg/dL (ref 70–99)
Glucose-Capillary: 98 mg/dL (ref 70–99)

## 2021-01-27 LAB — MAGNESIUM: Magnesium: 1.6 mg/dL — ABNORMAL LOW (ref 1.7–2.4)

## 2021-01-27 LAB — TROPONIN I (HIGH SENSITIVITY): Troponin I (High Sensitivity): 60 ng/L — ABNORMAL HIGH (ref <18)

## 2021-01-27 LAB — PHOSPHORUS: Phosphorus: 2.5 mg/dL (ref 2.5–4.6)

## 2021-01-27 LAB — LACTIC ACID, PLASMA
Lactic Acid, Venous: 10.5 mmol/L (ref 0.5–1.9)
Lactic Acid, Venous: 9.9 mmol/L (ref 0.5–1.9)

## 2021-01-27 MED ORDER — LACTATED RINGERS IV BOLUS
500.0000 mL | Freq: Once | INTRAVENOUS | Status: AC
  Administered 2021-01-27: 500 mL via INTRAVENOUS

## 2021-01-27 MED ORDER — SODIUM CHLORIDE 0.9% IV SOLUTION: Freq: Once | INTRAVENOUS | Status: AC

## 2021-01-27 MED ORDER — ACETAMINOPHEN 325 MG PO TABS: 650.0000 mg | ORAL_TABLET | Freq: Four times a day (QID) | ORAL | Status: DC | PRN

## 2021-01-27 MED ORDER — LORAZEPAM 2 MG/ML IJ SOLN: 1.0000 mg | INTRAMUSCULAR | Status: DC | PRN

## 2021-01-27 MED ORDER — ONDANSETRON HCL 4 MG/2ML IJ SOLN: 4.0000 mg | Freq: Four times a day (QID) | INTRAMUSCULAR | Status: DC | PRN

## 2021-01-27 MED ORDER — GLYCOPYRROLATE 0.2 MG/ML IJ SOLN: 0.2000 mg | INTRAMUSCULAR | Status: DC | PRN

## 2021-01-27 MED ORDER — ALBUMIN HUMAN 5 % IV SOLN
12.5000 g | Freq: Two times a day (BID) | INTRAVENOUS | Status: DC
  Administered 2021-01-27: 12.5 g via INTRAVENOUS
  Filled 2021-01-27: qty 250

## 2021-01-27 MED ORDER — NOREPINEPHRINE 4 MG/250ML-% IV SOLN
0.0000 ug/min | INTRAVENOUS | Status: DC
  Administered 2021-01-27 (×2): 2 ug/min via INTRAVENOUS
  Filled 2021-01-27: qty 250

## 2021-01-27 MED ORDER — HALOPERIDOL 1 MG PO TABS
2.0000 mg | ORAL_TABLET | ORAL | Status: DC | PRN
  Filled 2021-01-27: qty 2

## 2021-01-27 MED ORDER — DIPHENHYDRAMINE HCL 50 MG/ML IJ SOLN: 12.5000 mg | INTRAMUSCULAR | Status: DC | PRN

## 2021-01-27 MED ORDER — HYDROMORPHONE HCL 1 MG/ML IJ SOLN
0.5000 mg | INTRAMUSCULAR | Status: DC | PRN
  Administered 2021-01-28: 1 mg via INTRAVENOUS
  Filled 2021-01-27: qty 1

## 2021-01-27 MED ORDER — GLYCOPYRROLATE 1 MG PO TABS: 1.0000 mg | ORAL_TABLET | ORAL | Status: DC | PRN

## 2021-01-27 MED ORDER — LORAZEPAM 2 MG/ML IJ SOLN: 0.5000 mg | INTRAMUSCULAR | Status: DC | PRN

## 2021-01-27 MED ORDER — IOHEXOL 300 MG/ML  SOLN
100.0000 mL | Freq: Once | INTRAMUSCULAR | Status: AC | PRN
  Administered 2021-01-27: 100 mL via INTRAVENOUS

## 2021-01-27 MED ORDER — DEXTROSE 50 % IV SOLN
INTRAVENOUS | Status: AC
  Administered 2021-01-27: 12.5 g via INTRAVENOUS
  Filled 2021-01-27: qty 50

## 2021-01-27 MED ORDER — ACETAMINOPHEN 650 MG RE SUPP: 650.0000 mg | Freq: Four times a day (QID) | RECTAL | Status: DC | PRN

## 2021-01-27 MED ORDER — LORAZEPAM 2 MG/ML PO CONC: 1.0000 mg | ORAL | Status: DC | PRN

## 2021-01-27 MED ORDER — HALOPERIDOL LACTATE 5 MG/ML IJ SOLN: 2.0000 mg | INTRAMUSCULAR | Status: DC | PRN

## 2021-01-27 MED ORDER — LORAZEPAM 1 MG PO TABS: 1.0000 mg | ORAL_TABLET | ORAL | Status: DC | PRN

## 2021-01-27 MED ORDER — DEXTROSE 50 % IV SOLN: 12.5000 g | INTRAVENOUS | Status: AC

## 2021-01-27 MED ORDER — ONDANSETRON 4 MG PO TBDP: 4.0000 mg | ORAL_TABLET | Freq: Four times a day (QID) | ORAL | Status: DC | PRN

## 2021-01-27 MED ORDER — SODIUM BICARBONATE 8.4 % IV SOLN
50.0000 meq | Freq: Once | INTRAVENOUS | Status: AC
  Administered 2021-01-27: 50 meq via INTRAVENOUS
  Filled 2021-01-27: qty 50

## 2021-01-27 MED ORDER — HALOPERIDOL LACTATE 2 MG/ML PO CONC
2.0000 mg | ORAL | Status: DC | PRN
  Filled 2021-01-27: qty 1

## 2021-01-27 NOTE — Progress Notes (Signed)
OT Cancellation Note  Patient Details Name: Tamara [name] MRN: [id number] DOB: March 03, 1976   Cancelled Treatment:    Reason Eval/Treat Not Completed: Medical issues which prohibited therapy (Pt not responding to stimuili. Pt being taken to CT.)  OT to continue to follow.  Jefferey Pica, OTR/L Acute Rehabilitation Services Pager: [phone] Office: [phone]   Enoc Getter C 01/27/2021, 11:42 AM

## 2021-01-27 NOTE — Progress Notes (Signed)
Events overnight: At approx 23:00, this RN contacted [facility], Dr. Konrad Felix, with concerns about patients labile BP's. SBP currently 60-80 in all extremities. Patient is also less responsive. Throughout the night, New orders received included, EKG, CXR, lactate, troponin, blood cultures x2, 500cc bolus x2, am labs to be drawn, ABG and 1 amb bicarb. Even after these interventions BP still remained low, SBP 70's. New order received to initiate levophed infusion. Will continue to closely monitor.

## 2021-01-27 NOTE — Progress Notes (Addendum)
[facility] Progress Note  Patient: Tamara [name]    AQT:[id number]  DOA: 01/12/2021     Date of Service: the patient was seen and examined on 01/27/2021  Brief hospital course: 45 year old with history of ESRD HD, developmental delay/diminished mental capacity, HTN, COVID-19 with ARDS February 2021, depression, anxiety admitted for malfunction of hemodialysis catheter initially at [facility]. 1/21 presents with not receiving HD for 3 consecutive treatment due to an occluded catheter.  TDC treated with TPA dwell.incidental finding of COVID-19 infection, largely asymptomatic.  Tested positive on 1/11. 1/22 3 PRBC transfused.  RIJ TDC malfunctioning during HD.  General surgery was consulted for placement of right femoral HD cath.  HD completed. 1/23 IR consult-failed numerous recent catheter exchange and intervention, recent SVC angioplasty.  Note mentions patient clearly failing long-term TDC use.  Remains nauseated Transferred to [facility] for vascular surgery evaluation. 1/24 vascular surgery consult 1/25 vein mapping performed.  Platelets continues to worsen initially thought to be dilution. 1/27 hematology consulted.  Suspected to have DIC.  Transferred to progressive care unit.  2 units cryoprecipitate received. 1/28 PCCM consulted.  Transferred to ICU for pressors due to hypotension.  Two-physician DNR by PCCM and nephrology. 1/30 transfer back to floor.  Transition back to progressive care unit. 1/31 based on the discussion with legal guardian CODE STATUS changed from DNR to full code until they reviewed the case 2/1 bedside discussion with 2 social workers from [place].  Decision was made to transition patient to comfort and DNR.  Mother saw the patient at bedside as well.   Currently plan is to focus on comfort care.  Anticipating in-hospital death.  Assessment and Plan: Severe thrombocytopenia. DIC Anemia of chronic kidney disease with  acute component. Hematology consulted due to ongoing worsening platelets. HIT panel unremarkable. Work-up currently supports diagnosis of DIC given low fibrinogen level and scattered schistocytes. Received cryoprecipitate.  2 units. No role of anticoagulation for now  currently no evidence of thrombosis. Transfuse for hemoglobin less than 7, platelets less than 20. Fibrinogen trending down. Cryoprecipitate was ordered. But currently planning to transition to comfort care.  Suspected sepsis. Shock/hypotension. Hypothermia Given current diagnosis of DIC patient was empirically started on IV cefepime on 01/22/2021. Cultures were so far negative. Lactic acid was elevated to 6. Repeat lactic acid were not able to be obtained due to poor IV access despite multiple phlebotomy attempt. MRSA PCR was negative. Flagyl and vancomycin was added as well. Currently plan is relation to comfort care.  Patient remains in shock with IV pressors which we will discontinue.  Patient was also given IV albumin which we will discontinue.  ESRD on HD SP temporary HD catheter placement x2 Nephrology currently following. Receiving hemodialysis via temporary catheter. Currently no indication for acute hemodialysis. Per nephrology outpatient hemodialysis is not recommended for long-term given her lack of understanding of hemodialysis as well as multiple comorbidities and frequent issues with vascular access.  Hypokalemia, hypomagnesemia, hypophosphatemia. Now comfort care.  Hypoglycemia From poor p.o. intake retention patient is on D10 drip. We will discontinue it.  Now comfort care.  Essential hypertension. Chronic hypotension. On midodrine 3 times a week. Unable to take p.o. comfort care.  COVID-19 infection, asymptomatic Initially tested positive-01/06/2021. Isolation discontinued Continue supportive care. Saturating well on room air.  Chronic pancreatitis Intractable nausea and  vomiting. On Creon.  Hold  Hypothyroidism, currently sick euthyroid syndrome Free T4 elevated, TSH also elevated. Currently no therapy.   Acute metabolic  encephalopathy Progressively confused and lethargic. CT head unremarkable. Likely consistent with DIC. Although etiology still not clear. Continue as needed Ativan and Haldol for agitation control.  Depression. On Zoloft at home. Currently on hold. Monitor.  Bilateral upper extremity bruising. Likely from thrombocytopenia and IV access. Currently stable..  Elevated INR. In the setting of DIC.  Goals of care conversation. Patient has a legal guardian with [place]. Any procedures or any intervention that requires a consent DHS must be notified. Discussed with social worker on call for [facility] and recommended the patient is a poor candidate for hemodialysis outpatient and currently with multiple comorbidities with ongoing poor p.o. intake with very poor prognosis. Recommended transitioning to comfort care for this patient. Bedside discussion on 2/1 with family as well as legal guardians. Based on the discussion between myself, nephrology as well as prior discussion with ICU and hematology patient currently being transitioned to DNR and comfort care. Anticipating in hospital death.   Diet: NPO.  Unable to swallow safely.   Advance goals of care discussion: DNR/DNI, comfort care.  Family Communication: family was present at bedside, at the time of interview.  Legal guardians were also at bedside. Opportunity was given to ask question and all questions were answered satisfactorily.   Disposition:  Status is: Inpatient  Remains inpatient appropriate because: Anticipating in hospital death  Dispo:  Patient From: Home  Planned Disposition: To be determined  Expected discharge date: to be determined   Medically stable for discharge: No     Subjective: Minimally responsive and combative noncommunicative.  Agitated and  thrashing her head around.  No other purposeful movement.  Physical Exam:  General: Appear in moderate distress, bilateral upper extremity bruising, no other Rash; Oral Mucosa Clear, dry. no Abnormal Neck Mass Or lumps, Conjunctiva normal  Cardiovascular: S1 and S2 Present, no Murmur, Respiratory: Increased respiratory effort, Bilateral Air entry present and CTA, no Crackles, no wheezes Abdomen: Bowel Sound present, Soft and no tenderness Extremities: Generalized upper body anasarca, no pedal edema Neurology: Lethargic, not oriented, nonverbal, not following any commands.  No focal deficit.  Vitals:   01/27/21 1100 01/27/21 1300 01/27/21 1400 01/27/21 1500  BP: 96/66 121/65 106/62 95/75  Pulse: 89 84 84 82  Resp: 13 10 10  (!) 9  Temp:      TempSrc:      SpO2: 100% 99% 99% 100%  Weight:      Height:        Intake/Output Summary (Last 24 hours) at 01/27/2021 1927 Last data filed at 01/27/2021 1700 Gross per 24 hour  Intake 2609.81 ml  Output --  Net 2609.81 ml   Filed Weights   01/21/21 0145 01/22/21 2323 01/17/2021 0622  Weight: 66.4 kg 67.6 kg 67.2 kg    Data Reviewed: I have personally reviewed and interpreted daily labs, tele strips, imaging. I reviewed all nursing notes, pharmacy notes, vitals, pertinent old records I have discussed plan of care as described above with RN and patient/family.  CBC: Recent Labs  Lab 01/22/21 0813 01/22/21 1600 01/11/2021 1314 01/24/21 0322 01/25/21 0250 01/26/21 0325 01/27/21 0100 01/27/21 0310  WBC 8.7  --  6.2 8.3 5.7 6.8 5.5  --   NEUTROABS 5.4  --   --  5.8 4.2 4.9 4.0  --   HGB 8.1*  --  7.0* 8.5* 8.2* 8.2* 7.9* 7.5*  HCT 22.2*  --  20.5* 22.9* 23.1* 25.1* 22.8* 22.0*  MCV 79.0*  --  81.3 81.2 81.6 83.4 83.8  --  PLT 30*   < > 25* 84* 41*  42* 45* 47*  47*  --    < > = values in this interval not displayed.   Basic Metabolic Panel: Recent Labs  Lab 01/21/21 0400 01/22/21 0813 12/27/2020 1735 01/24/21 0014 01/24/21 0322  01/25/21 0250 01/26/21 0325 01/27/21 0100 01/27/21 0310  NA 138 142   < > 139 139 139 141 141 141  K 3.9 2.9*   < > 3.3* 3.1* 2.9* 3.3* 3.5 3.5  CL 104 108   < > 106 105 107 109 109  --   CO2 23 23   < > 17* 16* 15* 13* 11*  --   GLUCOSE 75 72   < > 135* 123* 128* 93 70  --   BUN 13 19   < > 20 20 22* 23* 25*  --   CREATININE 3.14* 4.33*   < > 4.99* 5.12* 5.56* 5.92* 6.31*  --   CALCIUM 6.9* 7.0*   < > 7.0* 7.0* 7.0* 7.1* 7.0*  --   MG 1.5* 2.0  --   --  1.8  --  1.7 1.6*  --   PHOS  --  1.8*  --   --   --   --   --  2.5  --    < > = values in this interval not displayed.    Studies: DG Chest 1 View  Result Date: 01/27/2021 CLINICAL DATA:  Pneumonia EXAM: CHEST  1 VIEW COMPARISON:  Radiograph 01/07/2021 FINDINGS: Redemonstration of the lower extremity dialysis catheter which is positioned in the region of the SVC or right innominate vein. Low lung volumes and atelectasis. No consolidation, features of edema, pneumothorax, or effusion. The cardiomediastinal contours are unremarkable. No acute osseous or soft tissue abnormality. Degenerative changes are present in the imaged spine and shoulders. Telemetry leads overlie the chest. IMPRESSION: 1. Low lung volumes and atelectasis. No other acute cardiopulmonary abnormality. 2. Stable position of the lower extremity dialysis catheter, terminating within the SVC or right innominate vein. Electronically Signed   By: Lovena Le M.D.   On: 01/27/2021 01:44   CT ABDOMEN PELVIS W CONTRAST  Result Date: 01/27/2021 CLINICAL DATA:  Nausea and vomiting. EXAM: CT ABDOMEN AND PELVIS WITH CONTRAST TECHNIQUE: Multidetector CT imaging of the abdomen and pelvis was performed using the standard protocol following bolus administration of intravenous contrast. CONTRAST:  131mL OMNIPAQUE IOHEXOL 300 MG/ML  SOLN COMPARISON:  CT scan 11/14/2020 FINDINGS: Lower chest: Patchy bibasilar atelectasis but no infiltrates or effusions. The heart is normal in size. No  pericardial effusion. There is a right femoral central venous catheter coursing all the way up into the SVC. Hepatobiliary: Diffuse and fairly marked fatty infiltration of the liver but no hepatic lesions or intrahepatic biliary dilatation. High attenuation material in the gallbladder is likely due to vicarious excretion of the contrast. The portal and hepatic veins are patent. No common bile duct dilatation. Pancreas: Marked atrophy of the pancreas is again demonstrated. There is a small residual pseudocyst noted in the pancreatic tail. This measures 2.3 cm and previously measured 7 cm. No findings suspicious for acute pancreatitis. Spleen: Normal size.  No focal lesions. Adrenals/Urinary Tract: Mild nodularity of both adrenal glands is stable. No worrisome renal lesions, renal calculi or hydronephrosis. The bladder is unremarkable and filled with contrast. Stomach/Bowel: The stomach, duodenum, small bowel and colon are grossly normal. No acute inflammatory changes, mass lesions or obstructive findings. Vascular/Lymphatic: The aorta and branch vessels  are patent. No aneurysm or dissection. Large caliber central venous catheter noted in the IVC and drain from the right common femoral vein. There is also a smaller caliber and shorter central venous catheter in the left femoral and iliac vein. No mesenteric or retroperitoneal mass or adenopathy. Reproductive: The uterus and right ovary are unremarkable. There is a stable 2.2 cm cyst associated with the left ovary. No follow-up imaging is recommended. Note: This recommendation does not apply to premenarchal patients and to those with increased risk (genetic, family history, elevated tumor markers or other high-risk factors) of ovarian cancer. Reference: JACR 2020 Feb; 17(2):248-254 Other: Diffuse and fairly marked body wall edema suggesting anasarca. No free abdominal/pelvic fluid collections are identified. Musculoskeletal: No significant bony findings. IMPRESSION: 1.  No acute abdominal/pelvic findings, mass lesions or adenopathy. 2. Diffuse and fairly marked fatty infiltration of the liver. 3. Interval decrease in size of the pancreatic tail pseudocyst. 4. Diffuse and fairly marked body wall edema suggesting anasarca. 5. Central venous catheters in place as above. Electronically Signed   By: Marijo Sanes M.D.   On: 01/27/2021 10:58    Scheduled Meds: . chlorhexidine  15 mL Mouth Rinse BID  . Chlorhexidine Gluconate Cloth  6 each Topical Q0600  . hydrocerin   Topical BID  . mouth rinse  15 mL Mouth Rinse q12n4p  . sodium chloride flush  10-40 mL Intracatheter Q12H   Continuous Infusions: . sodium chloride     PRN Meds: acetaminophen **OR** acetaminophen, diphenhydrAMINE, glycopyrrolate **OR** glycopyrrolate **OR** glycopyrrolate, haloperidol **OR** haloperidol **OR** haloperidol lactate, HYDROmorphone (DILAUDID) injection, LORazepam **OR** LORazepam **OR** LORazepam, LORazepam, ondansetron **OR** ondansetron (ZOFRAN) IV, sodium chloride flush  Time spent: The patient is critically ill with multiple organ systems failure and requires high complexity decision making for assessment and support, frequent evaluation and titration of therapies. Critical Care Time devoted to patient care services described in this note is 35 minutes   Author: Berle Mull, MD [facility] 01/27/2021 7:27 PM  To reach On-call, see care teams to locate the attending and reach out via [email or web address]. Between 7PM-7AM, please contact night-coverage If you still have difficulty reaching the attending provider, please page the [facility] (Director on Call) for [facility] on amion for assistance.

## 2021-01-27 NOTE — Progress Notes (Signed)
Assisted tele visit to patient with father. ° °Edra Riccardi P, RN  °

## 2021-01-27 NOTE — Progress Notes (Signed)
Patient ID: Tamara [name], female   DOB: 1976/07/04, 45 y.o.   MRN: [id number] S: Pt became hypotensive overnight and restarted on pressors O:BP (!) 133/110 (BP Location: Left Wrist)   Pulse (!) 114   Temp 97.6 F (36.4 C) (Axillary)   Resp 18   Ht 5\' 5"  (1.651 m)   Wt 67.2 kg   SpO2 100%   BMI 24.65 kg/m   Intake/Output Summary (Last 24 hours) at 01/27/2021 0830 Last data filed at 01/27/2021 0800 Gross per 24 hour  Intake 2133.23 ml  Output -  Net 2133.23 ml   Intake/Output: I/O last 3 completed shifts: In: 2755.3 [I.V.:919.4; IV Piggyback:1835.9] Out: -   Intake/Output this shift:  Total I/O In: 23.7 [I.V.:23.7] Out: -  Weight change:  Gen: unresponsive, lying in bed CVS: tachy Resp: occ rhonchi Abd: +BS, soft, nt/ND Ext: no edema, extensive ecchymosis of right arm  Recent Labs  Lab 01/22/21 0813 01/12/2021 1735 01/24/21 0014 01/24/21 0322 01/25/21 0250 01/26/21 0325 01/27/21 0100 01/27/21 0310  NA 142 141 139 139 139 141 141 141  K 2.9* 2.9* 3.3* 3.1* 2.9* 3.3* 3.5 3.5  CL 108 108 106 105 107 109 109  --   CO2 23 17* 17* 16* 15* 13* 11*  --   GLUCOSE 72 90 135* 123* 128* 93 70  --   BUN 19 21* 20 20 22* 23* 25*  --   CREATININE 4.33* 5.26* 4.99* 5.12* 5.56* 5.92* 6.31*  --   ALBUMIN 1.7* 1.7*  --  1.8* 1.7* 1.8* 1.7*  --   CALCIUM 7.0* 7.0* 7.0* 7.0* 7.0* 7.1* 7.0*  --   PHOS 1.8*  --   --   --   --   --  2.5  --   AST 23 41  --  37 27 23 20   --   ALT 21 33  --  33 32 34 35  --    Liver Function Tests: Recent Labs  Lab 01/25/21 0250 01/26/21 0325 01/27/21 0100  AST 27 23 20   ALT 32 34 35  ALKPHOS 61 71 67  BILITOT 1.8* 1.6* 1.6*  PROT 3.7* 4.0* 3.8*  ALBUMIN 1.7* 1.8* 1.7*   Recent Labs  Lab 01/22/21 2126  LIPASE 15   Recent Labs  Lab 01/22/21 1306  AMMONIA 23   CBC: Recent Labs  Lab 01/15/2021 1314 01/24/21 0322 01/25/21 0250 01/26/21 0325 01/27/21 0100 01/27/21 0310  WBC 6.2 8.3 5.7 6.8 5.5  --   NEUTROABS  --  5.8 4.2 4.9 4.0  --    HGB 7.0* 8.5* 8.2* 8.2* 7.9* 7.5*  HCT 20.5* 22.9* 23.1* 25.1* 22.8* 22.0*  MCV 81.3 81.2 81.6 83.4 83.8  --   PLT 25* 84* 41*  42* 45* 47*  47*  --    Cardiac Enzymes: No results for input(s): CKTOTAL, CKMB, CKMBINDEX, TROPONINI in the last 168 hours. CBG: Recent Labs  Lab 01/26/21 1926 01/26/21 2330 01/27/21 0334 01/27/21 0359 01/27/21 0705  GLUCAP 81 79 61* 133* 95    Iron Studies: No results for input(s): IRON, TIBC, TRANSFERRIN, FERRITIN in the last 72 hours. Studies/Results: DG Chest 1 View  Result Date: 01/27/2021 CLINICAL DATA:  Pneumonia EXAM: CHEST  1 VIEW COMPARISON:  Radiograph 01/06/2021 FINDINGS: Redemonstration of the lower extremity dialysis catheter which is positioned in the region of the SVC or right innominate vein. Low lung volumes and atelectasis. No consolidation, features of edema, pneumothorax, or effusion. The cardiomediastinal contours are unremarkable. No  acute osseous or soft tissue abnormality. Degenerative changes are present in the imaged spine and shoulders. Telemetry leads overlie the chest. IMPRESSION: 1. Low lung volumes and atelectasis. No other acute cardiopulmonary abnormality. 2. Stable position of the lower extremity dialysis catheter, terminating within the SVC or right innominate vein. Electronically Signed   By: Lovena Le M.D.   On: 01/27/2021 01:44   . chlorhexidine  15 mL Mouth Rinse BID  . Chlorhexidine Gluconate Cloth  6 each Topical Q0600  . darbepoetin (ARANESP) injection - DIALYSIS  150 mcg Intravenous Q Fri-HD  . hydrocerin   Topical BID  . levothyroxine  25 mcg Oral Q0600  . mouth rinse  15 mL Mouth Rinse q12n4p  . midodrine  10 mg Oral TID  . sodium chloride flush  10-40 mL Intracatheter Q12H    BMET    Component Value Date/Time   NA 141 01/27/2021 0310   K 3.5 01/27/2021 0310   CL 109 01/27/2021 0100   CO2 11 (L) 01/27/2021 0100   GLUCOSE 70 01/27/2021 0100   BUN 25 (H) 01/27/2021 0100   CREATININE 6.31 (H)  01/27/2021 0100   CREATININE 0.99 02/08/2017 1129   CALCIUM 7.0 (L) 01/27/2021 0100   GFRNONAA 8 (L) 01/27/2021 0100   GFRAA 8 (L) 09/12/2020 1905   CBC    Component Value Date/Time   WBC 5.5 01/27/2021 0100   RBC 2.72 (L) 01/27/2021 0100   HGB 7.5 (L) 01/27/2021 0310   HCT 22.0 (L) 01/27/2021 0310   PLT 47 (L) 01/27/2021 0100   PLT 47 (L) 01/27/2021 0100   MCV 83.8 01/27/2021 0100   MCH 29.0 01/27/2021 0100   MCHC 34.6 01/27/2021 0100   RDW 18.2 (H) 01/27/2021 0100   LYMPHSABS 1.3 01/27/2021 0100   MONOABS 0.1 01/27/2021 0100   EOSABS 0.0 01/27/2021 0100   BASOSABS 0.0 01/27/2021 0100     Assessment/Plan:  1. Severe DIC- Oncology following 2. Shock hypotension- empirically started on IV cefepime and restarted on pressors overnight. 3. ESRD- was on HD at [facility], however she is currently not suitable for outpatient dialysis given her poor functional and cognitive status as well as multiple comorbidities.  Difficult situation as she is a ward of the state and she does not have the capacity to understand the consequences.  Last HD was 01/22/21.  Recommend palliative care consult to transition to comfort care as she is not a candidate for outpatient dialysis.  No indication for dialysis at this time and recommend not resuming. 4. Anemia- of CKD.  Transfuse prn 5. Developmental delay/AMS 6. Disposition- poor overall prognosis and recommend palliative care consult and transition to comfort measures.  She is not suitable for outpatient dialysis given her poor functional and cognitive status.  She is a risk to herself as she has pulled out her femoral catheter in the recent past.  Ongoing dialysis will not provide an improvement of her quality of life, nor her functional/cognitive status.     Donetta Potts, MD [employer] [phone]

## 2021-01-27 NOTE — Progress Notes (Signed)
Tamara [name]   DOB:1976-11-02   MR#:[id number]   DGU#:[id number]  Assessment and Plan  This is a 45 year old female currently admitted due to malfunctioning hemodialysis catheter now found to have DIC of unclear etiology, ? COVID related. She deteriorated over the past 2 days with altered mentation and lactic acidosis, now on pressors  DIC panel today with fibrinogen of 103, INR of 1.6, PTT of 43 seconds, platelet count of 47 K, gave her 2 units of cryo while discussion is underway about her code status. Unfortunately she continues to deteriorate despite maximal support and is not a candidate for HD. I agree that given her underlying co-morbidities and less likelihood of complete recovery at this time, it is reasonable to make her comfort care. We will be available to answer any questions. Reviewed plan of care with Dr Posey Pronto.  [name], MD 01/04/2021  3:54 PM  Subjective:   Pt not responsive at the time of my visit. She was moaning when I tried to examine. Nursing at bedside.  Objective:  Vitals:   01/27/21 1400 01/27/21 1500  BP: 106/62 95/75  Pulse: 84 82  Resp: 10 (!) 9  Temp:    SpO2: 99% 100%    Body mass index is 24.65 kg/m.  Intake/Output Summary (Last 24 hours) at 01/27/2021 1607 Last data filed at 01/27/2021 1500 Gross per 24 hour  Intake 2584.83 ml  Output --  Net 2584.83 ml     Pt in no acute distress, moans intermittently             Ecchymosis on arm is about the same. No new ecchymosis.             NO oozing from lines. Trace edema extremities. Ant lung fields, CTA  CBG (last 3)  Recent Labs    01/27/21 0359 01/27/21 0705 01/27/21 1409  GLUCAP 133* 95 94     Labs:  Lab Results  Component Value Date   WBC 5.5 01/27/2021   HGB 7.5 (L) 01/27/2021   HCT 22.0 (L) 01/27/2021   MCV 83.8 01/27/2021   PLT 47 (L) 01/27/2021   PLT 47 (L) 01/27/2021   NEUTROABS 4.0 01/27/2021    Urine Studies No results for input(s): UHGB, CRYS in the last 72  hours.  Invalid input(s): UACOL, UAPR, USPG, UPH, UTP, UGL, UKET, UBIL, UNIT, UROB, ULEU, UEPI, UWBC, URBC, UBAC, CAST, [place], [place]  Basic Metabolic Panel: Recent Labs  Lab 01/21/21 0400 01/22/21 0813 12/28/2020 1735 01/24/21 0014 01/24/21 0322 01/25/21 0250 01/26/21 0325 01/27/21 0100 01/27/21 0310  NA 138 142   < > 139 139 139 141 141 141  K 3.9 2.9*   < > 3.3* 3.1* 2.9* 3.3* 3.5 3.5  CL 104 108   < > 106 105 107 109 109  --   CO2 23 23   < > 17* 16* 15* 13* 11*  --   GLUCOSE 75 72   < > 135* 123* 128* 93 70  --   BUN 13 19   < > 20 20 22* 23* 25*  --   CREATININE 3.14* 4.33*   < > 4.99* 5.12* 5.56* 5.92* 6.31*  --   CALCIUM 6.9* 7.0*   < > 7.0* 7.0* 7.0* 7.1* 7.0*  --   MG 1.5* 2.0  --   --  1.8  --  1.7 1.6*  --   PHOS  --  1.8*  --   --   --   --   --  2.5  --    < > = values in this interval not displayed.   GFR Estimated Creatinine Clearance: 10.3 mL/min (A) (by C-G formula based on SCr of 6.31 mg/dL (H)). Liver Function Tests: Recent Labs  Lab 01/14/2021 1735 01/24/21 0322 01/25/21 0250 01/26/21 0325 01/27/21 0100  AST 41 37 27 23 20   ALT 33 33 32 34 35  ALKPHOS 56 56 61 71 67  BILITOT 1.9* 2.0* 1.8* 1.6* 1.6*  PROT 3.8* 4.0* 3.7* 4.0* 3.8*  ALBUMIN 1.7* 1.8* 1.7* 1.8* 1.7*   Recent Labs  Lab 01/22/21 2126  LIPASE 15   Recent Labs  Lab 01/22/21 1306  AMMONIA 23   Coagulation profile Recent Labs  Lab 01/22/21 1600 01/20/2021 1314 01/24/21 1115 01/25/21 0250 01/27/21 0100  INR 1.6* 1.2 1.3* 1.4* 1.6*    CBC: Recent Labs  Lab 01/22/21 0813 01/22/21 1600 01/26/2021 1314 01/24/21 0322 01/25/21 0250 01/26/21 0325 01/27/21 0100 01/27/21 0310  WBC 8.7  --  6.2 8.3 5.7 6.8 5.5  --   NEUTROABS 5.4  --   --  5.8 4.2 4.9 4.0  --   HGB 8.1*  --  7.0* 8.5* 8.2* 8.2* 7.9* 7.5*  HCT 22.2*  --  20.5* 22.9* 23.1* 25.1* 22.8* 22.0*  MCV 79.0*  --  81.3 81.2 81.6 83.4 83.8  --   PLT 30*   < > 25* 84* 41*  42* 45* 47*  47*  --    < > = values in this  interval not displayed.   Cardiac Enzymes: No results for input(s): CKTOTAL, CKMB, CKMBINDEX, TROPONINI in the last 168 hours. BNP: Invalid input(s): POCBNP CBG: Recent Labs  Lab 01/26/21 2330 01/27/21 0334 01/27/21 0359 01/27/21 0705 01/27/21 1409  GLUCAP 79 61* 133* 95 94   D-Dimer Recent Labs    01/25/21 0250 01/27/21 0100  DDIMER 1.02* 1.29*   Hgb A1c No results for input(s): HGBA1C in the last 72 hours. Lipid Profile No results for input(s): CHOL, HDL, LDLCALC, TRIG, CHOLHDL, LDLDIRECT in the last 72 hours. Thyroid function studies No results for input(s): TSH, T4TOTAL, T3FREE, THYROIDAB in the last 72 hours.  Invalid input(s): FREET3 Anemia work up No results for input(s): VITAMINB12, FOLATE, FERRITIN, TIBC, IRON, RETICCTPCT in the last 72 hours. Microbiology Recent Results (from the past 240 hour(s))  Culture, blood (x 2)     Status: None (Preliminary result)   Collection Time: 01/22/21  9:26 PM   Specimen: BLOOD RIGHT HAND  Result Value Ref Range Status   Specimen Description BLOOD RIGHT HAND  Final   Special Requests AEROBIC BOTTLE ONLY Blood Culture adequate volume  Final   Culture   Final    NO GROWTH 4 DAYS Performed at [facility], [address], [place], [place] [address]    Report Status PENDING  Incomplete  Culture, blood (x 2)     Status: None (Preliminary result)   Collection Time: 01/22/21  9:26 PM   Specimen: BLOOD RIGHT ARM  Result Value Ref Range Status   Specimen Description BLOOD RIGHT ARM  Final   Special Requests AEROBIC BOTTLE ONLY Blood Culture adequate volume  Final   Culture   Final    NO GROWTH 4 DAYS Performed at [facility], [place] [address], [place], [place] [address]    Report Status PENDING  Incomplete  MRSA PCR Screening     Status: None   Collection Time: 01/22/21  9:48 PM   Specimen: Nasopharyngeal  Result Value Ref Range Status   MRSA by PCR NEGATIVE NEGATIVE Final    Comment:        The GeneXpert MRSA  Assay (FDA approved for NASAL specimens only), is one component of a comprehensive MRSA colonization surveillance program. It is not intended to diagnose MRSA infection nor to guide or monitor treatment for MRSA infections. Performed at [facility], [place] [address] [place], [place] [address]   Culture, blood (single)     Status: None (Preliminary result)   Collection Time: 01/07/2021  9:18 PM   Specimen: BLOOD  Result Value Ref Range Status   Specimen Description BLOOD SITE NOT SPECIFIED  Final   Special Requests   Final    BOTTLES DRAWN AEROBIC AND ANAEROBIC Blood Culture adequate volume   Culture   Final    NO GROWTH 4 DAYS Performed at [facility], [place] [address], [place], [place] [address]    Report Status PENDING  Incomplete    Studies:  DG Chest 1 View  Result Date: 01/27/2021 CLINICAL DATA:  Pneumonia EXAM: CHEST  1 VIEW COMPARISON:  Radiograph 01/14/2021 FINDINGS: Redemonstration of the lower extremity dialysis catheter which is positioned in the region of the SVC or right innominate vein. Low lung volumes and atelectasis. No consolidation, features of edema, pneumothorax, or effusion. The cardiomediastinal contours are unremarkable. No acute osseous or soft tissue abnormality. Degenerative changes are present in the imaged spine and shoulders. Telemetry leads overlie the chest. IMPRESSION: 1. Low lung volumes and atelectasis. No other acute cardiopulmonary abnormality. 2. Stable position of the lower extremity dialysis catheter, terminating within the SVC or right innominate vein. Electronically Signed   By: Lovena Le M.D.   On: 01/27/2021 01:44   CT ABDOMEN PELVIS W CONTRAST  Result Date: 01/27/2021 CLINICAL DATA:  Nausea and vomiting. EXAM: CT ABDOMEN AND PELVIS WITH CONTRAST TECHNIQUE: Multidetector CT imaging of the abdomen and pelvis was performed using the standard protocol following bolus administration of intravenous contrast. CONTRAST:  160mL OMNIPAQUE  IOHEXOL 300 MG/ML  SOLN COMPARISON:  CT scan 11/14/2020 FINDINGS: Lower chest: Patchy bibasilar atelectasis but no infiltrates or effusions. The heart is normal in size. No pericardial effusion. There is a right femoral central venous catheter coursing all the way up into the SVC. Hepatobiliary: Diffuse and fairly marked fatty infiltration of the liver but no hepatic lesions or intrahepatic biliary dilatation. High attenuation material in the gallbladder is likely due to vicarious excretion of the contrast. The portal and hepatic veins are patent. No common bile duct dilatation. Pancreas: Marked atrophy of the pancreas is again demonstrated. There is a small residual pseudocyst noted in the pancreatic tail. This measures 2.3 cm and previously measured 7 cm. No findings suspicious for acute pancreatitis. Spleen: Normal size.  No focal lesions. Adrenals/Urinary Tract: Mild nodularity of both adrenal glands is stable. No worrisome renal lesions, renal calculi or hydronephrosis. The bladder is unremarkable and filled with contrast. Stomach/Bowel: The stomach, duodenum, small bowel and colon are grossly normal. No acute inflammatory changes, mass lesions or obstructive findings. Vascular/Lymphatic: The aorta and branch vessels are patent. No aneurysm or dissection. Large caliber central venous catheter noted in the IVC and drain from the right common femoral vein. There is also a smaller caliber and shorter central venous catheter in the left femoral and iliac vein. No mesenteric or retroperitoneal mass or adenopathy. Reproductive: The uterus and right ovary are unremarkable. There is a stable 2.2 cm cyst associated with the  left ovary. No follow-up imaging is recommended. Note: This recommendation does not apply to premenarchal patients and to those with increased risk (genetic, family history, elevated tumor markers or other high-risk factors) of ovarian cancer. Reference: JACR 2020 Feb; 17(2):248-254 Other: Diffuse  and fairly marked body wall edema suggesting anasarca. No free abdominal/pelvic fluid collections are identified. Musculoskeletal: No significant bony findings. IMPRESSION: 1. No acute abdominal/pelvic findings, mass lesions or adenopathy. 2. Diffuse and fairly marked fatty infiltration of the liver. 3. Interval decrease in size of the pancreatic tail pseudocyst. 4. Diffuse and fairly marked body wall edema suggesting anasarca. 5. Central venous catheters in place as above. Electronically Signed   By: Marijo Sanes M.D.   On: 01/27/2021 10:58

## 2021-01-27 NOTE — Consult Note (Addendum)
Consultation Note Date: 01/27/2021   Patient Name: Tamara [name]  DOB: [birth year]  MRN: [id number]  Age / Sex: 45 y.o., female  PCP: Hal Morales, DO Referring Physician: Lavina Hamman, MD  Reason for Consultation: Establishing goals of care  HPI/Patient Profile: 45 y.o. female  with past medical history of ESRD on HD, diminished mental capacity/developmental delay, hypertension, history of COVID with ARDS Feb 2021, chronic pancreatitis, depression, anxiety admitted on 01/20/2021 with clogged HD catheter and found to have COVID infection and symptomatic anemia. She has continued to decline over course of hospitalization and now with severe DIC, shock hypotension, and NPO status. She had acute decline overnight 01/26/21. She has been deemed poor candidate for continuing outpatient HD as she has had functional and cognitive decline and has pulled out HD catheter. Medical team all agree that the most compassionate care we can provide Mr. Maxton at this time is for comfort and and peaceful death.   Clinical Assessment and Goals of Care: I reviewed records and went to bedside. Tamara [name] is unresponsive but when I attempted to awaken her she thrashed her head and appeared very uncomfortable. I discussed with Vaughan Basta, RN as well as Dr. Posey Pronto. I do feel that Tamara [name] is at end of life. I do worry that any further interventions will only serve to cause Tamara [name] pain and suffering at the end of her life. Per chart review she has had ongoing decline in cognitive and functional status and prolonging her life by artificial and aggressive measures is not going to improve her status or add to her quality of life. The compassionate path moving forward is to provide Tamara [name] is symptom management to relieve her of her suffering and allow her a peaceful death.   As I went to call legal guardian I noted that legal guardian  has been present at bedside and plans have been laid out for Tamara [name] for no escalation of care and once her family is able to visit with her proceed with comfort measures. I agree this is the right choice for Tamara [name]. I discussed with Dr. Posey Pronto. No further palliative needs as goals are clear.   Primary Decision Maker LEGAL GUARDIAN    SUMMARY OF RECOMMENDATIONS   - DNR and proceeding with comfort once family can visit  Code Status/Advance Care Planning:  DNR   Symptom Management:   Comfort measures  Palliative Prophylaxis:   Bowel Regimen, Frequent Pain Assessment, Oral Care and Turn Reposition  Additional Recommendations (Limitations, Scope, Preferences):  Full Comfort Care  Prognosis:   Hours - Days  Discharge Planning:  Likely hospital death.      Primary Diagnoses: Present on Admission: . Symptomatic anemia . ESRD (end stage renal disease) ([place]) . Problem with vascular access   I have reviewed the medical record, interviewed the patient and family, and examined the patient. The following aspects are pertinent.  Past Medical History:  Diagnosis Date  . Abnormal uterine bleeding (AUB) 01/26/2016  . Acute pancreatitis without necrosis  or infection, unspecified   . Anemia in chronic kidney disease (CKD)   . Anxiety and depression    mentally slow  . Cardiac arrhythmia   . Chronic abdominal pain   . COVID-19   . Dependence on renal dialysis ([place])   . Depression   . Gastro-esophageal reflux disease without esophagitis   . Generalized anxiety disorder   . Gout   . Hematuria 01/26/2016  . Hyperlipidemia   . Hypertension   . Hypomagnesemia   . Leg pain   . Major depressive disorder   . Obesity   . Ovarian cyst 02/10/2016  . Personal history of sudden cardiac arrest   . Pseudocyst of pancreas   . PUD (peptic ulcer disease)   . Pure hypercholesterolemia   . Reflux   . Renal disorder   . S/P colonoscopy 03/15/11   friable anal canal  . Sebaceous  cyst of labia 01/26/2016  . Tachycardia   . Unspecified psychosis not due to a substance or known physiological condition ([place])   . Yeast vaginitis 09/19/2013   Social History   Socioeconomic History  . Marital status: Single    Spouse name: Not on file  . Number of children: 0  . Years of education: Not on file  . Highest education level: Not on file  Occupational History  . Occupation: diabled    Employer: NOT EMPLOYED  Tobacco Use  . Smoking status: Never Smoker  . Smokeless tobacco: Never Used  Vaping Use  . Vaping Use: Never used  Substance and Sexual Activity  . Alcohol use: No  . Drug use: No  . Sexual activity: Not Currently    Birth control/protection: Surgical    Comment: tubal  Other Topics Concern  . Not on file  Social History Narrative  . Not on file   Social Determinants of Health   Financial Resource Strain: Not on file  Food Insecurity: Not on file  Transportation Needs: Not on file  Physical Activity: Not on file  Stress: Not on file  Social Connections: Not on file   Family History  Problem Relation Age of Onset  . Colon polyps Father   . Hypertension Father   . Diabetes Father   . Seizures Mother   . Hypertension Mother   . Diabetes Mother   . Diabetes Paternal Grandmother   . Colon cancer Neg Hx    Scheduled Meds: . sodium chloride   Intravenous Once  . chlorhexidine  15 mL Mouth Rinse BID  . Chlorhexidine Gluconate Cloth  6 each Topical Q0600  . darbepoetin (ARANESP) injection - DIALYSIS  150 mcg Intravenous Q Fri-HD  . hydrocerin   Topical BID  . levothyroxine  25 mcg Oral Q0600  . mouth rinse  15 mL Mouth Rinse q12n4p  . midodrine  10 mg Oral TID  . sodium chloride flush  10-40 mL Intracatheter Q12H   Continuous Infusions: . sodium chloride    . albumin human    . ceFEPime (MAXIPIME) IV Stopped (01/26/21 2032)  . dextrose 20 mL/hr at 01/27/21 0800  . famotidine (PEPCID) IV Stopped (01/26/21 2309)  . metronidazole Stopped  (01/27/21 7510)  . norepinephrine (LEVOPHED) Adult infusion 2 mcg/min (01/27/21 0811)   PRN Meds:.acetaminophen **OR** acetaminophen, dextromethorphan-guaiFENesin, ondansetron (ZOFRAN) IV, Resource ThickenUp Clear, sodium chloride flush Allergies  Allergen Reactions  . Penicillins     Tolerated cefazolin and ceftriaxone in past  . Sulfa Antibiotics    Review of Systems  Unable to perform ROS: Acuity  of condition    Physical Exam Vitals and nursing note reviewed.  Constitutional:      General: She is not in acute distress.    Appearance: She is ill-appearing.  Cardiovascular:     Rate and Rhythm: Tachycardia present.  Pulmonary:     Effort: No tachypnea, accessory muscle usage or respiratory distress.  Abdominal:     Palpations: Abdomen is soft.  Neurological:     Mental Status: She is unresponsive.     Vital Signs: BP (!) 133/110 (BP Location: Left Wrist)   Pulse (!) 114   Temp 97.6 F (36.4 C) (Axillary)   Resp 18   Ht 5\' 5"  (1.651 m)   Wt 67.2 kg   SpO2 100%   BMI 24.65 kg/m  Pain Scale: CPOT   Pain Score: Asleep   SpO2: SpO2: 100 % O2 Device:SpO2: 100 % O2 Flow Rate: .O2 Flow Rate (L/min): 2 L/min  IO: Intake/output summary:   Intake/Output Summary (Last 24 hours) at 01/27/2021 0911 Last data filed at 01/27/2021 0800 Gross per 24 hour  Intake 2113.16 ml  Output --  Net 2113.16 ml    LBM: Last BM Date: 01/20/21 Baseline Weight: Weight: 70 kg Most recent weight: Weight: 67.2 kg     Palliative Assessment/Data:     Time In/Out: 0940-1000, 1230-1245 Time Total: 35 min Greater than 50%  of this time was spent counseling and coordinating care related to the above assessment and plan.  Signed by: Vinie Sill, NP Palliative Medicine Team Pager # [phone] (M-F 8a-5p) Team Phone # [phone] (Nights/Weekends)

## 2021-01-27 NOTE — Consult Note (Signed)
   Tamara [name]  is a 45 y.o. female, with past medical history of end-stage renal disease on hemodialysis, diminished mental capacity/developmental delay, history of hypertension, history of COVID with ARDS in February 2021, depression, anxiety, patient was sent in for evaluation of clogged hemodialysis catheter, patient had permacath in right subclavian area, and she was diagnosed with COVID, so her schedule has been altered for COVID dialysis days, patient herself cannot provide any history, history was obtained from ED staff, currently patient did not have a full dialysis session over 2 weeks, and apparently her dialysis catheter was clogged today during dialysis, so she could not be dialyzed, so she was not sent to ED for evaluation. - in ED she is in no respiratory distress, no evidence of volume overload, count for potassium of 2.8, BUN of 22, and a hemoglobin of 6, she is Hemoccult negative, [facility] hospitalist consulted to admit.  Psychiatric consult placed per request of her legal [facility]) for psychological evaluation and capacity.  Writer did speak with legal guardian Marinda Elk, yesterday regarding psych consult request.  At that time patient was nonverbal, and unable to complete evaluation.  We agreed to have a family meeting at the bedside in the morning with her medical team to review her current condition.  During the night patient's condition deteriorated, requiring initiation of IV pressors to maintain map of 65.  Patient is also been nonresponsive to stimuli, requiring additional measures and aggressive care overnight.  Writer initially presented to patient's room, and she was off the unit for CT scan of abdomen pelvis.  Spoke with her bedside nurse Ines Bloomer, who will notify me when patient returns.  Upon returning to the room I was met with Dr. Patel(attending MD), legal guardian, DSS supervisor, unit director, and bedside nurse who are all present to discuss care.  As  previously noted patient was unable to participate in psychiatric evaluation as she remains nonverbal and unresponsive.  She does appear to be uncomfortable and agitated, as she is observed thrashing her head and neck around in bed.  After careful discussion and consideration with Dr. Posey Pronto, legal guardian and supervisor decided to make patient a DNR.  They wish to continue current measures that are in place, however will not escalate her care.  Legal guardian is attempting to contact her parents to initiate contact, prior to placing patient in comfort care measures.  Meeting terminated, all questions and concerns answere Support, reassurance, and encouragement were offered and graciously accepted.  -Psychiatry to sign off at this time.

## 2021-01-27 NOTE — Progress Notes (Incomplete)
Patient's mother in to visit her.

## 2021-01-27 NOTE — Progress Notes (Signed)
Pharmacy Antibiotic Note  Tamara [name] is a 45 y.o. female admitted on 12/29/2020 with DIC and possible sepsis.  Pharmacy has been consulted for cefepime dosing. Also on Flagyl.  Patient was receiving HD - last 1/27, no further plans for dialysis at this time.   WBC wnl. PCT elevated but trending down. LA up 9.9.  SCr continues to climb at 6.31. No urine output charted.   Plan: Cefepime 1 g q24 hr.  Flagyl 500 mg IV every 8 hours.  Stop date added for 7 days.    Height: 5\' 5"  (165.1 cm) Weight: 67.2 kg (148 lb 2.4 oz) IBW/kg (Calculated) : 57  Temp (24hrs), Avg:96.4 F (35.8 C), Min:92.3 F (33.5 C), Max:98.4 F (36.9 C)  Recent Labs  Lab 01/22/21 2126 01/14/2021 1314 12/28/2020 1732 01/07/2021 1735 01/24/21 0014 01/24/21 0322 01/25/21 0250 01/26/21 0325 01/27/21 0100  WBC  --  6.2  --   --   --  8.3 5.7 6.8 5.5  CREATININE  --   --   --    < > 4.99* 5.12* 5.56* 5.92* 6.31*  LATICACIDVEN 6.0*  --  7.6*  --   --   --   --   --  10.5*  VANCORANDOM  --   --   --   --   --   --   --   --  17   < > = values in this interval not displayed.    Estimated Creatinine Clearance: 10.3 mL/min (A) (by C-G formula based on SCr of 6.31 mg/dL (H)).    Allergies  Allergen Reactions  . Penicillins     Tolerated cefazolin and ceftriaxone in past  . Sulfa Antibiotics     Antimicrobials this admission: Cefepime 1/27  >>  Vanc 1/27 >>1/30   Microbiology results: 1/27 BCx: pend 1/27 UCx: pend  1/27 sputum: pend 1/27 MRSA PCR: neg  Thank you for allowing pharmacy to be a part of this patient's care.  Sloan Leiter, PharmD, BCPS, BCCCP Clinical Pharmacist Please refer to [facility] for [place] numbers 01/27/2021 10:09 AM  Please check AMION for all [place] phone numbers After 10:00 PM, call [place] [phone]

## 2021-01-27 NOTE — Progress Notes (Addendum)
HOSPITAL MEDICINE OVERNIGHT EVENT NOTE    Notified by nursing midway through the evening shift patient was exhibiting worsening severe hypertension.  By 2am,  patient is exhibiting systolic blood pressures in the 60s. Patient began to exhibit associated worsening lethargy and decreased responsiveness. Patient also began to exhibit an associated tachycardia with heart rates as high as 120s.  Upon my evaluation of the patient at the bedside, she was found to be minimally responsive. Heart sounds were regular. Lungs relatively clear for rales at the bases. Abdomen is soft and benign. Patient exhibits profound third spacing, particularly in all 4 extremities with skin examination revealing extensive ecchymoses.   Initially, attempts were made to improve blood pressures administering 2 separate 500 cc boluses of lactated Ringer solution.  Stat chest x-ray, CBC, lactic acid, EKG and ABG were obtained. Retail banker, which the patient seemed to have been using the afternoon prior for hypothermia was discontinued in case it was worsening with patient's hypotension.  Lactic acid level was found to be profoundly elevated at 10.5, even higher than 7.6 four days ago.  Hemoglobin was found to be relatively stable at 7.9 and therefore acute blood loss was not the culprit.  Considering patient's DIC, platelet count was found to be stable.  Thromboembolism as a cause of her hypotension is unlikely due to a lack of hypoxia/respiratory symptoms.    Unfortunately, lactated ringer boluses were ineffective in increasing patient's blood pressures. An amp of bicarbonate was also administered in case of severe acidemia contributing to patient's hemodynamic instability however ABG revealed a substantial metabolic acidosis with respiratory compensation resulting in a near normal pH of 7.3.    Due to systolic blood pressures continuing to be extremely low with SBP's in the 60's and 70s and MAP well below 65 we have initiated a  low rate Levophed infusion with target MAP of greater than 65.  Prognosis is exceedingly poor, further clinical decline is imminent. Chart reviewed, daytime provider proceeding with goals of care discussion with patient's guardian.  Vernelle Emerald  MD [facility]

## 2021-01-27 NOTE — Progress Notes (Signed)
Chaplain responded to request for spiritual support.  Patient at EOL. Bedside her mother [name], and friend and a Education officer, museum. Chaplain encouraged them to share memories.  Chaplain offered support and scripture and prayer.  Will continue to be available if needed. Rev. Tamsen Snider Pager [phone]

## 2021-01-27 DEATH — deceased

## 2021-01-28 ENCOUNTER — Ambulatory Visit: Payer: Medicare Other | Admitting: Gastroenterology

## 2021-01-28 DIAGNOSIS — R579 Shock, unspecified: Secondary | ICD-10-CM

## 2021-01-28 DIAGNOSIS — G9341 Metabolic encephalopathy: Secondary | ICD-10-CM

## 2021-01-28 DIAGNOSIS — E039 Hypothyroidism, unspecified: Secondary | ICD-10-CM

## 2021-01-28 DIAGNOSIS — U071 COVID-19: Secondary | ICD-10-CM

## 2021-01-28 DIAGNOSIS — Z515 Encounter for palliative care: Secondary | ICD-10-CM

## 2021-01-28 DIAGNOSIS — T68XXXA Hypothermia, initial encounter: Secondary | ICD-10-CM

## 2021-01-28 DIAGNOSIS — D696 Thrombocytopenia, unspecified: Secondary | ICD-10-CM

## 2021-01-28 DIAGNOSIS — D65 Disseminated intravascular coagulation [defibrination syndrome]: Secondary | ICD-10-CM | POA: Diagnosis not present

## 2021-01-28 LAB — CULTURE, BLOOD (ROUTINE X 2)
Culture: NO GROWTH
Culture: NO GROWTH
Special Requests: ADEQUATE
Special Requests: ADEQUATE

## 2021-01-28 LAB — CULTURE, BLOOD (SINGLE)
Culture: NO GROWTH
Special Requests: ADEQUATE

## 2021-01-29 NOTE — Progress Notes (Signed)
Patient was made full comfort measures yesterday. Patient asystole at 2323 hours 01/30/21.  No apical heart beat or respirations noted, confirmed with Mackey Birchwood, RN.  Pupils fixed, dilated and non reactive. Glennie Hawk, patients guardian of [place] notified. Dr Marlowe Sax of [facility] notified.

## 2021-02-01 LAB — CULTURE, BLOOD (ROUTINE X 2)
Culture: NO GROWTH
Culture: NO GROWTH
Special Requests: ADEQUATE
Special Requests: ADEQUATE

## 2021-02-02 ENCOUNTER — Encounter: Payer: Medicare Other | Admitting: Vascular Surgery

## 2021-02-05 LAB — VITAMIN B1: Vitamin B1 (Thiamine): 39.1 nmol/L — ABNORMAL LOW (ref 66.5–200.0)

## 2021-02-24 NOTE — Progress Notes (Signed)
Pt will be transitioning to comfort care . No labs obtained for today. We will sign off at this time.

## 2021-02-24 NOTE — Progress Notes (Signed)
Nutrition Brief Note  Chart reviewed due to LOS and NPO status. Patient transitioning to comfort care.  No nutrition interventions warranted at this time.  Please consult as needed.   Lucas Mallow, RD, LDN, CNSC Please refer to [facility] for contact information.

## 2021-02-24 NOTE — Death Summary Note (Signed)
DEATH SUMMARY   Patient Details  Name: KONNI KESINGER MRN: [id number] DOB: September 18, 1976  Admission/Discharge Information   Admit Date:  01/30/2021  Date of Death: Date of Death: 02-09-21  Time of Death: Time of Death: 03/30/2322  Length of Stay: 03-19-2023  Referring Physician: Hal Morales, DO   Reason(s) for Hospitalization  Malfunctioning HD catheter  Diagnoses  Preliminary cause of death:  Secondary Diagnoses (including complications and co-morbidities):  Principal Problem:   DIC (disseminated intravascular coagulation) ([place]) Active Problems:   ESRD (end stage renal disease) ([place])   Symptomatic anemia   Problem with vascular access   Comfort measures only status   Severe thrombocytopenia ([place])   Shock ([place])   Hypothermia   COVID-19 virus infection   Hypothyroidism   Acute metabolic encephalopathy   Brief Hospital Course (including significant findings, care, treatment, and services provided and events leading to death)  Manami AURY SCOLLARD is a 45 y.o. year old female who female with a history of ESRD on HD, developmental delay, diminished mental capacity, hypertension, COVID-19 with ARDS, depression, anxiety. Patient presented secondary to malfunctioning hemodialysis catheter. While admitted, patient had difficulty obtaining IV access to resume HD. She required vascular surgery consult. Her hospital course was complicated by severe thrombocytopenia which was related to severe DIC of unclear etiology. Medical oncology was consulted. She received 2 of cryoprecipitate. Hospitalization was further complicated by transfer to ICU for vasopressor support for hypotension. Sepsis was considered but appears to be ruled out. While in the ICU, she was transitioned to DNR and transferred out of the ICU. Situation was evaluated by legal guardian who reversed DNR until Ms. Keo's case could be reviewed. Decision was then made to resume DNR and transition to full comfort measures. She required 5 units  of PRBC during her hospitalization.    Pertinent Labs and Studies  Significant Diagnostic Studies DG Chest 1 View  Result Date: 01/27/2021 CLINICAL DATA:  Pneumonia EXAM: CHEST  1 VIEW COMPARISON:  Radiograph 01/24/2021 FINDINGS: Redemonstration of the lower extremity dialysis catheter which is positioned in the region of the SVC or right innominate vein. Low lung volumes and atelectasis. No consolidation, features of edema, pneumothorax, or effusion. The cardiomediastinal contours are unremarkable. No acute osseous or soft tissue abnormality. Degenerative changes are present in the imaged spine and shoulders. Telemetry leads overlie the chest. IMPRESSION: 1. Low lung volumes and atelectasis. No other acute cardiopulmonary abnormality. 2. Stable position of the lower extremity dialysis catheter, terminating within the SVC or right innominate vein. Electronically Signed   By: Lovena Le M.D.   On: 01/27/2021 01:44   CT HEAD WO CONTRAST  Result Date: 01/22/2021 CLINICAL DATA:  Mental status change, unknown cause EXAM: CT HEAD WITHOUT CONTRAST TECHNIQUE: Contiguous axial images were obtained from the base of the skull through the vertex without intravenous contrast. COMPARISON:  None. FINDINGS: Brain: No acute infarct or intracranial hemorrhage. No mass lesion. No midline shift, ventriculomegaly or extra-axial fluid collection. Vascular: No hyperdense vessel or unexpected calcification. Skull: Negative for fracture or focal lesion. Sinuses/Orbits: Normal orbits. Right maxillary sinus mucous retention cyst. No mastoid effusion. Other: None. IMPRESSION: No acute intracranial process. Electronically Signed   By: Primitivo Gauze M.D.   On: 01/22/2021 14:17   CT ABDOMEN PELVIS W CONTRAST  Result Date: 01/27/2021 CLINICAL DATA:  Nausea and vomiting. EXAM: CT ABDOMEN AND PELVIS WITH CONTRAST TECHNIQUE: Multidetector CT imaging of the abdomen and pelvis was performed using the standard protocol following  bolus administration of intravenous  contrast. CONTRAST:  176mL OMNIPAQUE IOHEXOL 300 MG/ML  SOLN COMPARISON:  CT scan 11/14/2020 FINDINGS: Lower chest: Patchy bibasilar atelectasis but no infiltrates or effusions. The heart is normal in size. No pericardial effusion. There is a right femoral central venous catheter coursing all the way up into the SVC. Hepatobiliary: Diffuse and fairly marked fatty infiltration of the liver but no hepatic lesions or intrahepatic biliary dilatation. High attenuation material in the gallbladder is likely due to vicarious excretion of the contrast. The portal and hepatic veins are patent. No common bile duct dilatation. Pancreas: Marked atrophy of the pancreas is again demonstrated. There is a small residual pseudocyst noted in the pancreatic tail. This measures 2.3 cm and previously measured 7 cm. No findings suspicious for acute pancreatitis. Spleen: Normal size.  No focal lesions. Adrenals/Urinary Tract: Mild nodularity of both adrenal glands is stable. No worrisome renal lesions, renal calculi or hydronephrosis. The bladder is unremarkable and filled with contrast. Stomach/Bowel: The stomach, duodenum, small bowel and colon are grossly normal. No acute inflammatory changes, mass lesions or obstructive findings. Vascular/Lymphatic: The aorta and branch vessels are patent. No aneurysm or dissection. Large caliber central venous catheter noted in the IVC and drain from the right common femoral vein. There is also a smaller caliber and shorter central venous catheter in the left femoral and iliac vein. No mesenteric or retroperitoneal mass or adenopathy. Reproductive: The uterus and right ovary are unremarkable. There is a stable 2.2 cm cyst associated with the left ovary. No follow-up imaging is recommended. Note: This recommendation does not apply to premenarchal patients and to those with increased risk (genetic, family history, elevated tumor markers or other high-risk factors) of  ovarian cancer. Reference: JACR 2020 Feb; 17(2):248-254 Other: Diffuse and fairly marked body wall edema suggesting anasarca. No free abdominal/pelvic fluid collections are identified. Musculoskeletal: No significant bony findings. IMPRESSION: 1. No acute abdominal/pelvic findings, mass lesions or adenopathy. 2. Diffuse and fairly marked fatty infiltration of the liver. 3. Interval decrease in size of the pancreatic tail pseudocyst. 4. Diffuse and fairly marked body wall edema suggesting anasarca. 5. Central venous catheters in place as above. Electronically Signed   By: Marijo Sanes M.D.   On: 01/27/2021 10:58   DG Chest Port 1 View  Result Date: 01/07/2021 CLINICAL DATA:  Dialysis catheter placement EXAM: PORTABLE CHEST 1 VIEW COMPARISON:  01/01/2021 FINDINGS: Interval removal of the right internal jugular dialysis catheter and placement of dialysis catheter from below. This passes through the right atrium and into the superior vena cava or right innominate vein. Lungs are clear. Heart is normal size. No effusions. IMPRESSION: Interval placement of dialysis catheter from lower extremity which passes through the right heart and is located in the upper SVC or right innominate vein. Electronically Signed   By: Rolm Baptise M.D.   On: 01/20/2021 15:34   DG Chest Port 1 View  Result Date: 01/15/2021 CLINICAL DATA:  Evaluate dialysis catheter EXAM: PORTABLE CHEST 1 VIEW COMPARISON:  11/16/2020 FINDINGS: Lungs are clear.  No pleural effusion or pneumothorax. The heart is normal in size. Right IJ dual lumen dialysis catheter with its distal tip at the cavoatrial junction. IMPRESSION: Right IJ dual lumen dialysis catheter with its distal tip at the cavoatrial junction. Electronically Signed   By: Julian Hy M.D.   On: 01/25/2021 16:51   DG Abd Portable 1V  Result Date: 01/22/2021 CLINICAL DATA:  45 year old female with nausea vomiting. EXAM: PORTABLE ABDOMEN - 1 VIEW COMPARISON:  CT abdomen  pelvis dated  09/05/2020. FINDINGS: No bowel dilatation or evidence of obstruction. No free air or radiopaque calculi. The osseous structures and soft tissues are unremarkable. IMPRESSION: Negative. Electronically Signed   By: Anner Crete M.D.   On: 01/22/2021 19:32   VAS [place] ABI WITH/WO TBI  Result Date: 01/20/2021 LOWER EXTREMITY DOPPLER STUDY Indications: AVF creation. High Risk Factors: Hypertension.  Limitations: Today's exam was limited due to patient positioning, patient unable              to lie flat, patient intolerant to cuff pressure, involuntary              patient movement, bandages and poor patient cooperation. Comparison Study: No prior studies. Performing Technologist: Carlos Levering RVT  Examination Guidelines: A complete evaluation includes at minimum, Doppler waveform signals and systolic blood pressure reading at the level of bilateral brachial, anterior tibial, and posterior tibial arteries, when vessel segments are accessible. Bilateral testing is considered an integral part of a complete examination. Photoelectric Plethysmograph (PPG) waveforms and toe systolic pressure readings are included as required and additional duplex testing as needed. Limited examinations for reoccurring indications may be performed as noted.  ABI Findings: +--------+------------------+-----+---------+------------------+ Right   Rt Pressure (mmHg)IndexWaveform Comment            +--------+------------------+-----+---------+------------------+ [id number]                    triphasic                   +--------+------------------+-----+---------+------------------+ PTA                                     Unable to insonate +--------+------------------+-----+---------+------------------+ DP                             absent                      +--------+------------------+-----+---------+------------------+ +--------+------------------+-----+--------+------------------+ Left    Lt Pressure  (mmHg)IndexWaveformComment            +--------+------------------+-----+--------+------------------+ Brachial                               Unable to insonate +--------+------------------+-----+--------+------------------+ PTA     105               0.93                            +--------+------------------+-----+--------+------------------+ DP      112               0.99                            +--------+------------------+-----+--------+------------------+ +-------+-----------+-----------+------------+------------+ ABI/TBIToday's ABIToday's TBIPrevious ABIPrevious TBI +-------+-----------+-----------+------------+------------+ Left   0.99                                           +-------+-----------+-----------+------------+------------+  Summary: Right: Unable to obtain ABI due to patient positioning, and poor patient cooperation. Left: Resting left ankle-brachial index is within normal range. No evidence of significant left lower extremity arterial disease.  *  See table(s) above for measurements and observations.  Electronically signed by Deitra Mayo MD on 01/20/2021 at 4:11:22 PM.    Final    VAS [place] UPPER EXTREMITY ARTERIAL DUPLEX  Result Date: 01/20/2021 UPPER EXTREMITY DUPLEX STUDY Indications: Patient complains of AVF creation.  Risk Factors: Hypertension. Limitations: Patient positioning, poor patient cooperation, patient movement,              patient pain tolerance Comparison Study: 09/05/2020 Performing Technologist: Oliver Hum RVT  Examination Guidelines: A complete evaluation includes B-mode imaging, spectral Doppler, color Doppler, and power Doppler as needed of all accessible portions of each vessel. Bilateral testing is considered an integral part of a complete examination. Limited examinations for reoccurring indications may be performed as noted.  Right Doppler Findings: +---------------+----------+---------+--------+--------+ Site            PSV (cm/s)Waveform StenosisComments +---------------+----------+---------+--------+--------+ Subclavian Dist          triphasic                 +---------------+----------+---------+--------+--------+ Brachial Dist  74        triphasic        0.32cm   +---------------+----------+---------+--------+--------+ Radial Dist    47        triphasic        0.15cm   +---------------+----------+---------+--------+--------+ Ulnar Dist     31        triphasic        0.12cm   +---------------+----------+---------+--------+--------+   Left Doppler Findings: +-------------+----------+---------+--------+--------+ Site         PSV (cm/s)Waveform StenosisComments +-------------+----------+---------+--------+--------+ Brachial Dist89        triphasic        0.23cm   +-------------+----------+---------+--------+--------+ Radial Dist  47        triphasic        0.17     +-------------+----------+---------+--------+--------+ Ulnar Dist   28        biphasic         0.1cm    +-------------+----------+---------+--------+--------+   Electronically signed by Deitra Mayo MD on 01/20/2021 at 4:12:51 PM.    Final    VAS [place] LOWER EXTREMITY VENOUS (DVT)  Result Date: 01/25/2021  Lower Venous DVT Study Limitations: Bandages and Dialysis access in proximal right thigh, patient cooperation. Comparison Study: No prior study Performing Technologist: Sharion Dove RVS  Examination Guidelines: A complete evaluation includes B-mode imaging, spectral Doppler, color Doppler, and power Doppler as needed of all accessible portions of each vessel. Bilateral testing is considered an integral part of a complete examination. Limited examinations for reoccurring indications may be performed as noted. The reflux portion of the exam is performed with the patient in reverse Trendelenburg.  +---------+---------------+---------+-----------+----------+-------------------+ RIGHT     CompressibilityPhasicitySpontaneityPropertiesThrombus Aging      +---------+---------------+---------+-----------+----------+-------------------+ CFV                                                   Not well visualized +---------+---------------+---------+-----------+----------+-------------------+ SFJ                                                   Not well visualized +---------+---------------+---------+-----------+----------+-------------------+ FV Prox  Full                                                             +---------+---------------+---------+-----------+----------+-------------------+  FV Mid   Full                                                             +---------+---------------+---------+-----------+----------+-------------------+ FV DistalFull           Yes      Yes                                      +---------+---------------+---------+-----------+----------+-------------------+ PFV                                                   Not well visualized +---------+---------------+---------+-----------+----------+-------------------+ POP                     Yes      Yes                                      +---------+---------------+---------+-----------+----------+-------------------+ PTV      Full                                                             +---------+---------------+---------+-----------+----------+-------------------+ PERO     Full                                                             +---------+---------------+---------+-----------+----------+-------------------+   +----+---------------+---------+-----------+----------+--------------+ LEFTCompressibilityPhasicitySpontaneityPropertiesThrombus Aging +----+---------------+---------+-----------+----------+--------------+ CFV Full           Yes      Yes                                  +----+---------------+---------+-----------+----------+--------------+     Summary: RIGHT: - There is no evidence of deep vein thrombosis in the lower extremity. However, portions of this examination were limited- see technologist comments above.  LEFT: - No evidence of common femoral vein obstruction.  *See table(s) above for measurements and observations. Electronically signed by Jamelle Haring on 01/25/2021 at 5:53:40 PM.    Final    VAS [place] UPPER EXT VEIN MAPPING (PRE-OP AVF)  Result Date: 01/20/2021 UPPER EXTREMITY VEIN MAPPING  Indications: Pre-access. Limitations: Patient positioning, poor patient cooperation, patient movement,              patient pain tolerance Comparison Study: No prior studies. Performing Technologist: Oliver Hum RVT  Examination Guidelines: A complete evaluation includes B-mode imaging, spectral Doppler, color Doppler, and power Doppler as needed of all accessible portions of each vessel. Bilateral testing is considered an integral part of a complete examination. Limited examinations for reoccurring indications may be performed  as noted. +-----------------+-------------+----------+--------------+ Right Cephalic   Diameter (cm)Depth (cm)   Findings    +-----------------+-------------+----------+--------------+ Shoulder             0.22        1.20                  +-----------------+-------------+----------+--------------+ Prox upper arm       0.23        1.40                  +-----------------+-------------+----------+--------------+ Mid upper arm        0.24        1.60                  +-----------------+-------------+----------+--------------+ Dist upper arm       0.18        1.40                  +-----------------+-------------+----------+--------------+ Antecubital fossa    0.19        0.40                  +-----------------+-------------+----------+--------------+ Prox forearm         0.13        1.00                   +-----------------+-------------+----------+--------------+ Mid forearm          0.09        1.10                  +-----------------+-------------+----------+--------------+ Dist forearm                            not visualized +-----------------+-------------+----------+--------------+ +-----------------+-------------+----------+--------------+ Right Basilic    Diameter (cm)Depth (cm)   Findings    +-----------------+-------------+----------+--------------+ Shoulder             0.50        1.10                  +-----------------+-------------+----------+--------------+ Mid upper arm        0.43        0.98                  +-----------------+-------------+----------+--------------+ Dist upper arm       0.23        1.20     branching    +-----------------+-------------+----------+--------------+ Antecubital fossa    0.20        1.10     branching    +-----------------+-------------+----------+--------------+ Prox forearm         0.10        0.69                  +-----------------+-------------+----------+--------------+ Mid forearm          0.06        0.49                  +-----------------+-------------+----------+--------------+ Distal forearm                          not visualized +-----------------+-------------+----------+--------------+ +-----------------+-------------+----------+--------+ Left Cephalic    Diameter (cm)Depth (cm)Findings +-----------------+-------------+----------+--------+ Shoulder             0.17        1.30            +-----------------+-------------+----------+--------+ Prox upper  arm       0.13        1.20            +-----------------+-------------+----------+--------+ Mid upper arm        0.10        1.40            +-----------------+-------------+----------+--------+ Dist upper arm       0.08        0.96            +-----------------+-------------+----------+--------+ Antecubital fossa    0.06         0.52            +-----------------+-------------+----------+--------+ Prox forearm         0.16        0.63            +-----------------+-------------+----------+--------+ Mid forearm          0.15        0.69            +-----------------+-------------+----------+--------+ Dist forearm         0.09        0.33            +-----------------+-------------+----------+--------+ +-----------------+-------------+----------+--------------+ Left Basilic     Diameter (cm)Depth (cm)   Findings    +-----------------+-------------+----------+--------------+ Shoulder             0.22        1.40                  +-----------------+-------------+----------+--------------+ Mid upper arm        0.22        1.30                  +-----------------+-------------+----------+--------------+ Dist upper arm       0.18        1.20                  +-----------------+-------------+----------+--------------+ Antecubital fossa    0.20        0.87     branching    +-----------------+-------------+----------+--------------+ Prox forearm         0.06        0.66                  +-----------------+-------------+----------+--------------+ Mid forearm                             not visualized +-----------------+-------------+----------+--------------+ Distal forearm                          not visualized +-----------------+-------------+----------+--------------+ *See table(s) above for measurements and observations.  Diagnosing physician: Deitra Mayo MD Electronically signed by Deitra Mayo MD on 01/20/2021 at 4:12:32 PM.    Final    HYBRID OR IMAGING ([place])  Result Date: 12/30/2020 There is no interpretation for this exam.  This order is for images obtained during a surgical procedure.  Please See "Surgeries" Tab for more information regarding the procedure.    Microbiology Recent Results (from the past 240 hour(s))  Culture, blood (single)     Status: None    Collection Time: 01/21/2021  9:18 PM   Specimen: BLOOD  Result Value Ref Range Status   Specimen Description BLOOD SITE NOT SPECIFIED  Final   Special Requests   Final    BOTTLES DRAWN AEROBIC AND ANAEROBIC Blood Culture adequate  volume   Culture   Final    NO GROWTH 5 DAYS Performed at [facility], [place] [address] [place], [place] [address]    Report Status 02/10/2021 FINAL  Final  Culture, blood (routine x 2)     Status: None   Collection Time: 01/27/21  1:40 AM   Specimen: BLOOD  Result Value Ref Range Status   Specimen Description BLOOD LEFT ARM  Final   Special Requests   Final    BOTTLES DRAWN AEROBIC ONLY Blood Culture adequate volume   Culture   Final    NO GROWTH 5 DAYS Performed at [facility], [place] [address] [place], [place] [address]    Report Status 02/01/2021 FINAL  Final  Culture, blood (routine x 2)     Status: None   Collection Time: 01/27/21  2:07 AM   Specimen: BLOOD  Result Value Ref Range Status   Specimen Description BLOOD LEFT ARM  Final   Special Requests   Final    BOTTLES DRAWN AEROBIC ONLY Blood Culture adequate volume   Culture   Final    NO GROWTH 5 DAYS Performed at [facility], [address] [place], [place] [address]    Report Status 02/01/2021 FINAL  Final    Lab Basic Metabolic Panel: Recent Labs  Lab 01/27/21 0100 01/27/21 0310  NA 141 141  K 3.5 3.5  CL 109  --   CO2 11*  --   GLUCOSE 70  --   BUN 25*  --   CREATININE 6.31*  --   CALCIUM 7.0*  --   MG 1.6*  --   PHOS 2.5  --    Liver Function Tests: Recent Labs  Lab 01/27/21 0100  AST 20  ALT 35  ALKPHOS 67  BILITOT 1.6*  PROT 3.8*  ALBUMIN 1.7*   No results for input(s): LIPASE, AMYLASE in the last 168 hours. No results for input(s): AMMONIA in the last 168 hours. CBC: Recent Labs  Lab 01/27/21 0100 01/27/21 0310  WBC 5.5  --   NEUTROABS 4.0  --   HGB 7.9* 7.5*  HCT 22.8* 22.0*  MCV 83.8  --   PLT 47*  47*  --    Cardiac  Enzymes: No results for input(s): CKTOTAL, CKMB, CKMBINDEX, TROPONINI in the last 168 hours. Sepsis Labs: Recent Labs  Lab 01/27/21 0100 01/27/21 0930  WBC 5.5  --   LATICACIDVEN 10.5* 9.9*    Procedures/Operations     Cordelia Poche 02/02/2021, 1:57 PM

## 2021-02-24 NOTE — Progress Notes (Signed)
Chart reviewed and appreciate palliative care assistance in clarifying goals of care with Tamara [name]'s guardian.  She is not transitioning to comfort care and agree with decision.  Nothing to add and will sign off.  Please call with any questions or concerns.

## 2021-02-24 NOTE — Progress Notes (Addendum)
PROGRESS NOTE    Tamara [name]  URK:[id number] DOB: 06-02-1976 DOA: 01/03/2021 PCP: Hal Morales, DO   Brief Narrative: 45 year old with history of ESRD HD, developmental delay/diminished mental capacity, HTN, COVID-19 with ARDS February 2021, depression, anxiety admitted for malfunction of hemodialysis catheter initially at [facility]. 1/21 presents with not receiving HD for 3 consecutive treatment due to an occluded catheter.  TDC treated with TPA dwell.incidental finding of COVID-19 infection, largely asymptomatic.  Tested positive on 1/11. 1/22 3 PRBC transfused.  RIJ TDC malfunctioning during HD.  General surgery was consulted for placement of right femoral HD cath.  HD completed. 1/23 IR consult-failed numerous recent catheter exchange and intervention, recent SVC angioplasty.  Note mentions patient clearly failing long-term TDC use.  Remains nauseated Transferred to [facility] for vascular surgery evaluation. 1/24 vascular surgery consult 1/25 vein mapping performed.  Platelets continues to worsen initially thought to be dilution. 1/27 hematology consulted.  Suspected to have DIC.  Transferred to progressive care unit.  2 units cryoprecipitate received. 1/28 PCCM consulted.  Transferred to ICU for pressors due to hypotension.  Two-physician DNR by PCCM and nephrology. 1/30 transfer back to floor.  Transition back to progressive care unit. 1/31 based on the discussion with legal guardian CODE STATUS changed from DNR to full code until they reviewed the case 2/1 bedside discussion with 2 social workers from [place].  Decision was made to transition patient to comfort and DNR.  Mother saw the patient at bedside as well.   Assessment & Plan:   Principal Problem:   DIC (disseminated intravascular coagulation) ([place]) Active Problems:   ESRD (end stage renal disease) ([place])   Symptomatic anemia   Problem with vascular access   Comfort  measures only status   Severe thrombocytopenia (HCC)   Shock ([place])   Hypothermia   COVID-19 virus infection   Hypothyroidism   Acute metabolic encephalopathy   Patient presented secondary to malfunctioning hemodialysis catheter. While admitted, patient had difficulty obtaining IV access to resume HD. She required vascular surgery consult. Her hospital course was complicated by severe thrombocytopenia which was related to severe DIC of unclear etiology. Medical oncology was consulted. She received 2 of cryoprecipitate. Hospitalization was further complicated by transfer to ICU for vasopressor support for hypotension. Sepsis was considered but appears to be ruled out. While in the ICU, she was transitioned to DNR and transferred out of the ICU. Situation was evaluated by legal guardian who reversed DNR until Tamara [name]'s case could be reviewed. Decision was then made to resume DNR and transition to full comfort measures. She required 5 units of PRBC during her hospitalization. -Continue comfort measures   DVT prophylaxis: Comfort measures Code Status:   Code Status: DNR Family Communication: None Disposition Plan: Anticipate hospital death   Consultants:   PCCM  Medical oncology  Vascular surgery  Nephrology    Subjective: Patient obtunded  Objective: Vitals:   02/27/2021 1500 Feb 27, 2021 1600 02-27-2021 1700 02/27/21 1800  BP:      Pulse: 74 69 65 65  Resp: 11 (!) 8 (!) 8 (!) 8  Temp:      TempSrc:      SpO2: 98% 99% 98% 97%  Weight:      Height:        Intake/Output Summary (Last 24 hours) at Feb 27, 2021 1840 Last data filed at 01/27/2021 2000 Gross per 24 hour  Intake 31.25 ml  Output -  Net 31.25 ml   [employer]  01/21/21 0145 01/22/21 2323 01/02/2021 0622  Weight: 66.4 kg 67.6 kg 67.2 kg    Examination:  General exam: Appears calm and comfortable  Respiratory system: Clear to auscultation. Respiratory effort normal. Cardiovascular system: S1 & S2 heard, RRR. No  murmurs, rubs, gallops or clicks. Gastrointestinal system: Abdomen is nondistended, soft and nontender. No organomegaly or masses felt. Normal bowel sounds heard.   Data Reviewed: I have personally reviewed following labs and imaging studies  CBC Lab Results  Component Value Date   WBC 5.5 01/27/2021   RBC 2.72 (L) 01/27/2021   HGB 7.5 (L) 01/27/2021   HCT 22.0 (L) 01/27/2021   MCV 83.8 01/27/2021   MCH 29.0 01/27/2021   PLT 47 (L) 01/27/2021   PLT 47 (L) 01/27/2021   MCHC 34.6 01/27/2021   RDW 18.2 (H) 01/27/2021   LYMPHSABS 1.3 01/27/2021   MONOABS 0.1 01/27/2021   EOSABS 0.0 01/27/2021   BASOSABS 0.0 16/09/9603     Last metabolic panel Lab Results  Component Value Date   NA 141 01/27/2021   K 3.5 01/27/2021   CL 109 01/27/2021   CO2 11 (L) 01/27/2021   BUN 25 (H) 01/27/2021   CREATININE 6.31 (H) 01/27/2021   GLUCOSE 70 01/27/2021   GFRNONAA 8 (L) 01/27/2021   GFRAA 8 (L) 09/12/2020   CALCIUM 7.0 (L) 01/27/2021   PHOS 2.5 01/27/2021   PROT 3.8 (L) 01/27/2021   ALBUMIN 1.7 (L) 01/27/2021   BILITOT 1.6 (H) 01/27/2021   ALKPHOS 67 01/27/2021   AST 20 01/27/2021   ALT 35 01/27/2021   ANIONGAP 21 (H) 01/27/2021    CBG (last 3)  Recent Labs    01/27/21 0705 01/27/21 1409 01/27/21 1612  GLUCAP 95 94 98     GFR: Estimated Creatinine Clearance: 10.3 mL/min (A) (by C-G formula based on SCr of 6.31 mg/dL (H)).  Coagulation Profile: Recent Labs  Lab 01/22/21 1600 12/28/2020 1314 01/24/21 1115 01/25/21 0250 01/27/21 0100  INR 1.6* 1.2 1.3* 1.4* 1.6*    Recent Results (from the past 240 hour(s))  Culture, blood (x 2)     Status: None   Collection Time: 01/22/21  9:26 PM   Specimen: BLOOD RIGHT HAND  Result Value Ref Range Status   Specimen Description BLOOD RIGHT HAND  Final   Special Requests AEROBIC BOTTLE ONLY Blood Culture adequate volume  Final   Culture   Final    NO GROWTH 5 DAYS Performed at [facility], [place] [address],  [place], [place] [address]    Report Status 02/07/2021 FINAL  Final  Culture, blood (x 2)     Status: None   Collection Time: 01/22/21  9:26 PM   Specimen: BLOOD RIGHT ARM  Result Value Ref Range Status   Specimen Description BLOOD RIGHT ARM  Final   Special Requests AEROBIC BOTTLE ONLY Blood Culture adequate volume  Final   Culture   Final    NO GROWTH 5 DAYS Performed at [facility], [place] [address], [place], [place] [address]    Report Status 02-07-21 FINAL  Final  MRSA PCR Screening     Status: None   Collection Time: 01/22/21  9:48 PM   Specimen: Nasopharyngeal  Result Value Ref Range Status   MRSA by PCR NEGATIVE NEGATIVE Final    Comment:        The GeneXpert MRSA Assay (FDA approved for NASAL specimens only), is one component of a comprehensive MRSA colonization surveillance program. It is not  intended to diagnose MRSA infection nor to guide or monitor treatment for MRSA infections. Performed at [facility], [place] [address] [place], [place] [address]   Culture, blood (single)     Status: None   Collection Time: 01/11/2021  9:18 PM   Specimen: BLOOD  Result Value Ref Range Status   Specimen Description BLOOD SITE NOT SPECIFIED  Final   Special Requests   Final    BOTTLES DRAWN AEROBIC AND ANAEROBIC Blood Culture adequate volume   Culture   Final    NO GROWTH 5 DAYS Performed at [facility], [address] [place], [place] [address]    Report Status 26-Feb-2021 FINAL  Final  Culture, blood (routine x 2)     Status: None (Preliminary result)   Collection Time: 01/27/21  1:40 AM   Specimen: BLOOD  Result Value Ref Range Status   Specimen Description BLOOD LEFT ARM  Final   Special Requests   Final    BOTTLES DRAWN AEROBIC ONLY Blood Culture adequate volume   Culture   Final    NO GROWTH 1 DAY Performed at [facility], [place] [address] [place], [place] [address]    Report Status PENDING  Incomplete  Culture, blood (routine x 2)     Status:  None (Preliminary result)   Collection Time: 01/27/21  2:07 AM   Specimen: BLOOD  Result Value Ref Range Status   Specimen Description BLOOD LEFT ARM  Final   Special Requests   Final    BOTTLES DRAWN AEROBIC ONLY Blood Culture adequate volume   Culture   Final    NO GROWTH 1 DAY Performed at [facility], [place] [address], [place], [place] [address]    Report Status PENDING  Incomplete        Radiology Studies: DG Chest 1 View  Result Date: 01/27/2021 CLINICAL DATA:  Pneumonia EXAM: CHEST  1 VIEW COMPARISON:  Radiograph 01/11/2021 FINDINGS: Redemonstration of the lower extremity dialysis catheter which is positioned in the region of the SVC or right innominate vein. Low lung volumes and atelectasis. No consolidation, features of edema, pneumothorax, or effusion. The cardiomediastinal contours are unremarkable. No acute osseous or soft tissue abnormality. Degenerative changes are present in the imaged spine and shoulders. Telemetry leads overlie the chest. IMPRESSION: 1. Low lung volumes and atelectasis. No other acute cardiopulmonary abnormality. 2. Stable position of the lower extremity dialysis catheter, terminating within the SVC or right innominate vein. Electronically Signed   By: Lovena Le M.D.   On: 01/27/2021 01:44   CT ABDOMEN PELVIS W CONTRAST  Result Date: 01/27/2021 CLINICAL DATA:  Nausea and vomiting. EXAM: CT ABDOMEN AND PELVIS WITH CONTRAST TECHNIQUE: Multidetector CT imaging of the abdomen and pelvis was performed using the standard protocol following bolus administration of intravenous contrast. CONTRAST:  178mL OMNIPAQUE IOHEXOL 300 MG/ML  SOLN COMPARISON:  CT scan 11/14/2020 FINDINGS: Lower chest: Patchy bibasilar atelectasis but no infiltrates or effusions. The heart is normal in size. No pericardial effusion. There is a right femoral central venous catheter coursing all the way up into the SVC. Hepatobiliary: Diffuse and fairly marked fatty infiltration of the liver  but no hepatic lesions or intrahepatic biliary dilatation. High attenuation material in the gallbladder is likely due to vicarious excretion of the contrast. The portal and hepatic veins are patent. No common bile duct dilatation. Pancreas: Marked atrophy of the pancreas is again demonstrated. There is a small residual pseudocyst noted in the  pancreatic tail. This measures 2.3 cm and previously measured 7 cm. No findings suspicious for acute pancreatitis. Spleen: Normal size.  No focal lesions. Adrenals/Urinary Tract: Mild nodularity of both adrenal glands is stable. No worrisome renal lesions, renal calculi or hydronephrosis. The bladder is unremarkable and filled with contrast. Stomach/Bowel: The stomach, duodenum, small bowel and colon are grossly normal. No acute inflammatory changes, mass lesions or obstructive findings. Vascular/Lymphatic: The aorta and branch vessels are patent. No aneurysm or dissection. Large caliber central venous catheter noted in the IVC and drain from the right common femoral vein. There is also a smaller caliber and shorter central venous catheter in the left femoral and iliac vein. No mesenteric or retroperitoneal mass or adenopathy. Reproductive: The uterus and right ovary are unremarkable. There is a stable 2.2 cm cyst associated with the left ovary. No follow-up imaging is recommended. Note: This recommendation does not apply to premenarchal patients and to those with increased risk (genetic, family history, elevated tumor markers or other high-risk factors) of ovarian cancer. Reference: JACR 2020 Feb; 17(2):248-254 Other: Diffuse and fairly marked body wall edema suggesting anasarca. No free abdominal/pelvic fluid collections are identified. Musculoskeletal: No significant bony findings. IMPRESSION: 1. No acute abdominal/pelvic findings, mass lesions or adenopathy. 2. Diffuse and fairly marked fatty infiltration of the liver. 3. Interval decrease in size of the pancreatic tail  pseudocyst. 4. Diffuse and fairly marked body wall edema suggesting anasarca. 5. Central venous catheters in place as above. Electronically Signed   By: Marijo Sanes M.D.   On: 01/27/2021 10:58        Scheduled Meds: . chlorhexidine  15 mL Mouth Rinse BID  . Chlorhexidine Gluconate Cloth  6 each Topical Q0600  . hydrocerin   Topical BID  . mouth rinse  15 mL Mouth Rinse q12n4p  . sodium chloride flush  10-40 mL Intracatheter Q12H   Continuous Infusions: . sodium chloride       LOS: 10 days     Cordelia Poche, MD [facility] Feb 13, 2021, 6:40 PM  If 7PM-7AM, please contact night-coverage [email or web address]

## 2021-02-24 DEATH — deceased

## 2021-03-18 ENCOUNTER — Ambulatory Visit: Payer: Medicare Other | Admitting: Gastroenterology

## 2022-06-26 IMAGING — XA IR FLUORO GUIDE CV LINE*R*
5 series · 13 of 14 positions shown · IV contrast (IODINE)
Comparison: none

INDICATION: End-stage renal disease, poor functioning right IJ dialysis catheter

[Series 1: body 4 care · 4 of 17 frames shown (1 of 3)]
[frame 3/17]
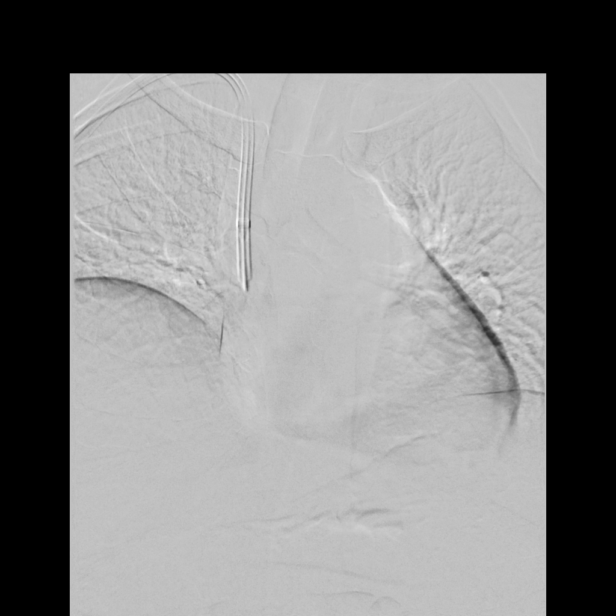
[frame 9/17]
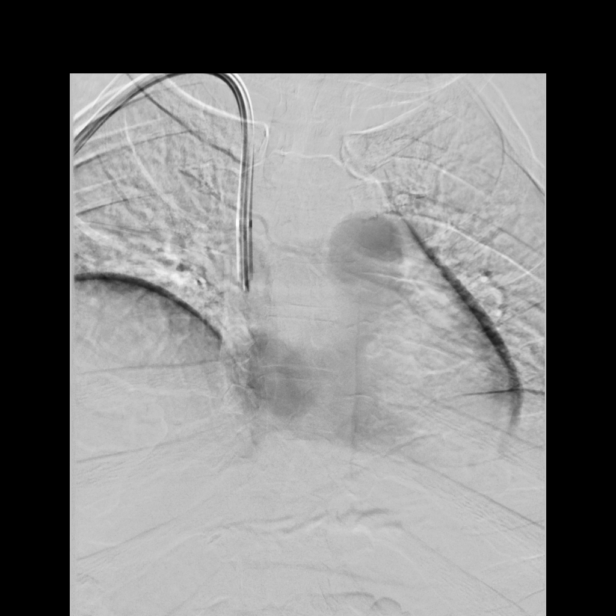
[frame 13/17]
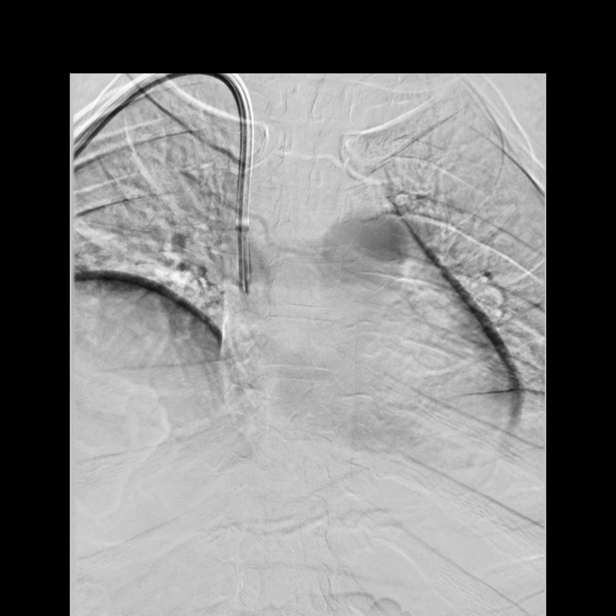
[frame 15/17]
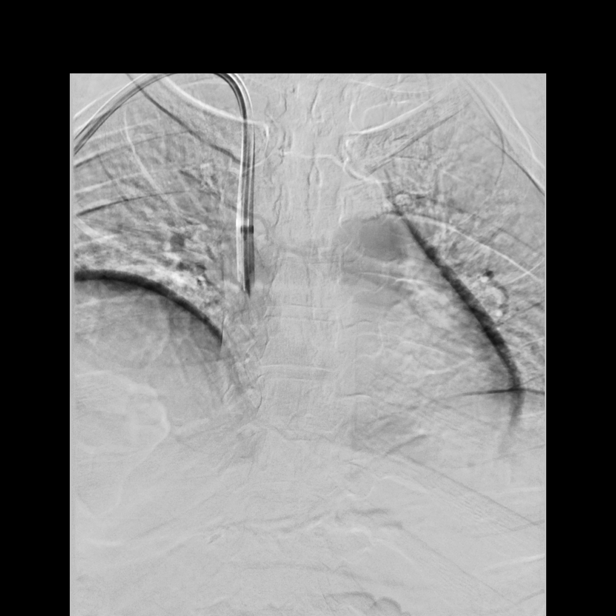

[Series 2: body 4 care · 3 of 17 frames shown (2 of 3)]
[frame 3/17]
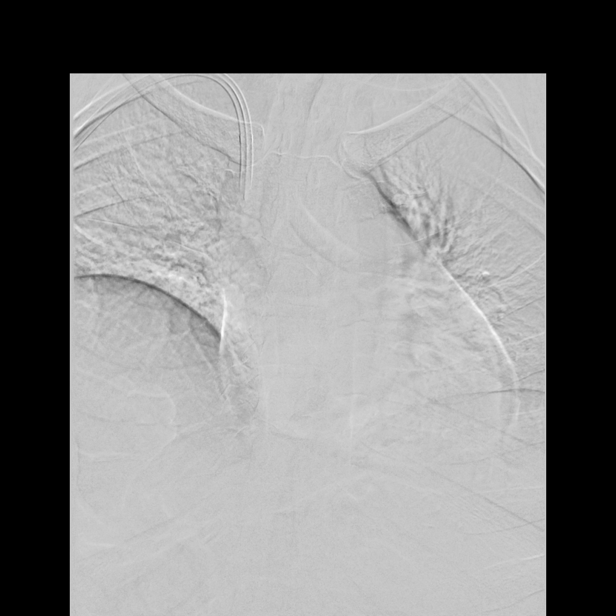
[frame 7/17]
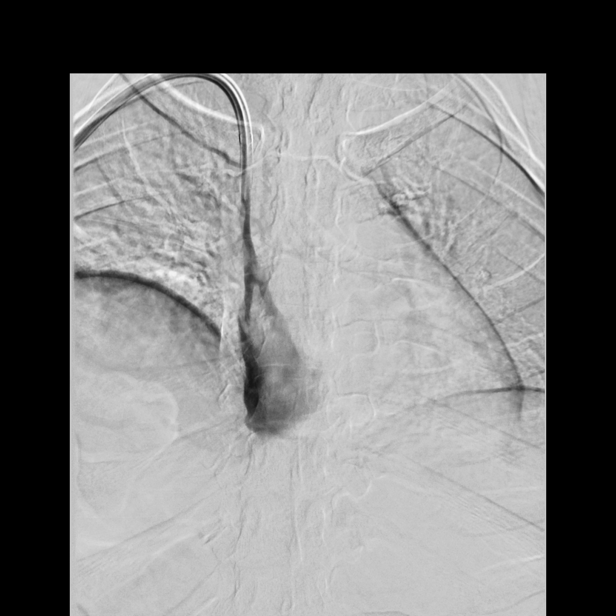
[frame 15/17]
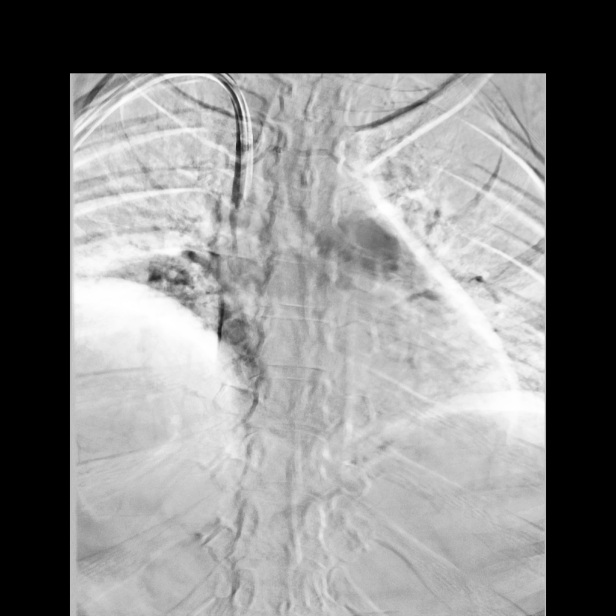

[Series 3: fl (-) angio · 1 of 1 slices shown (1 of 2)]
[im 1/1]
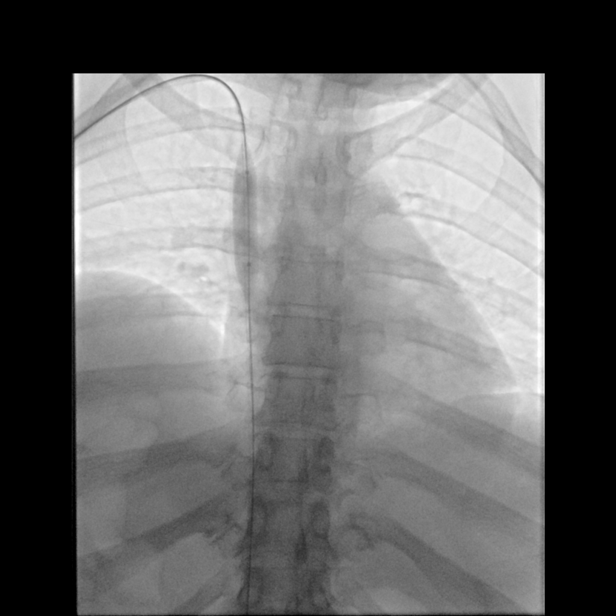

[Series 4: body 4 care · 4 of 16 frames shown (3 of 3)]
[frame 3/16]
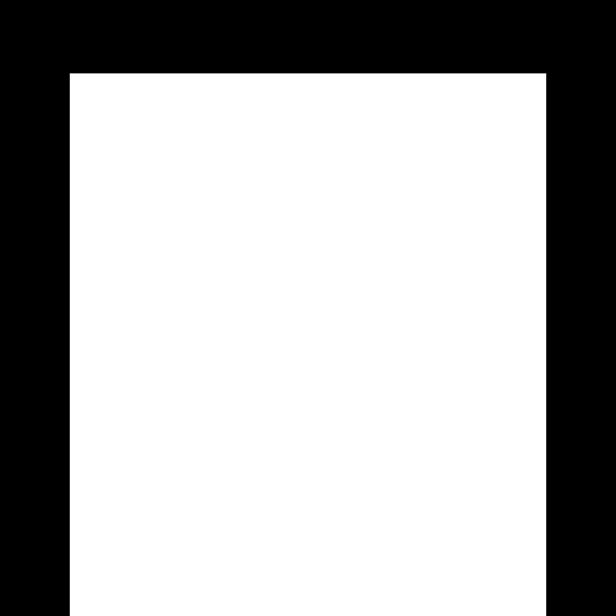
[frame 9/16]
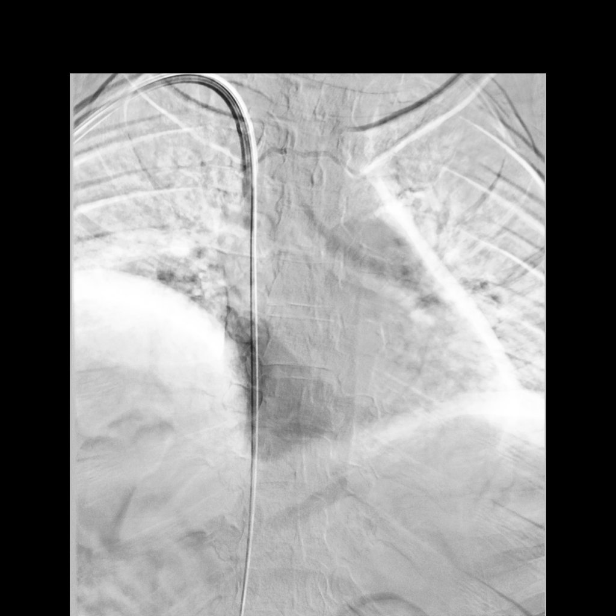
[frame 14/16]
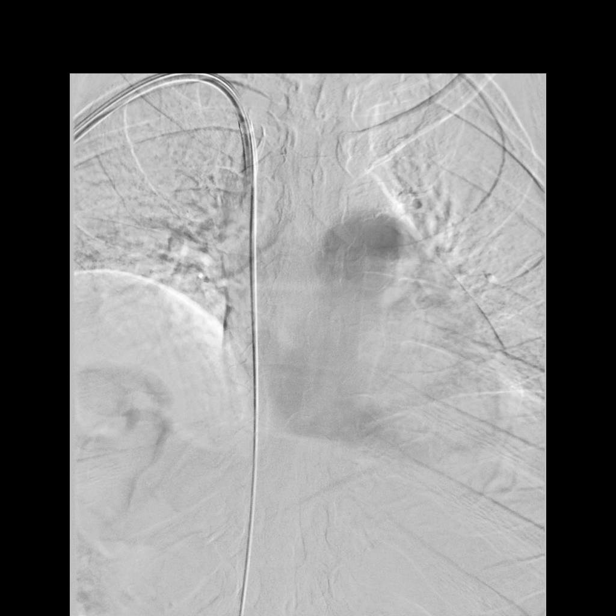
[frame 16/16]
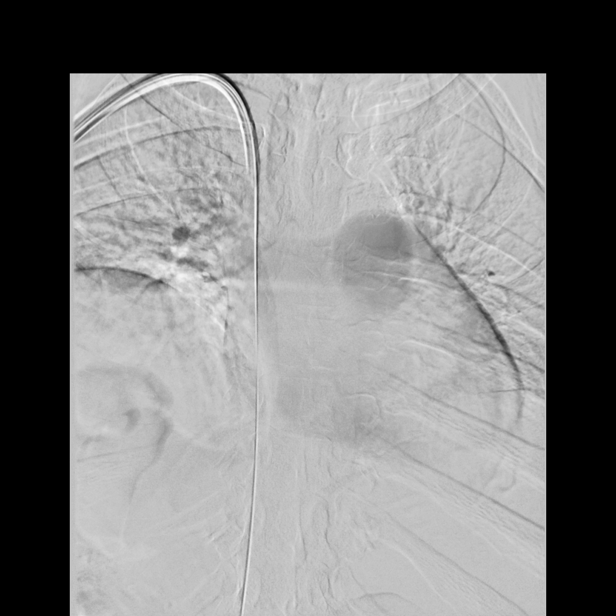

[Series 6: fl (-) angio · 1 of 1 slices shown (2 of 2)]
[im 1/1]
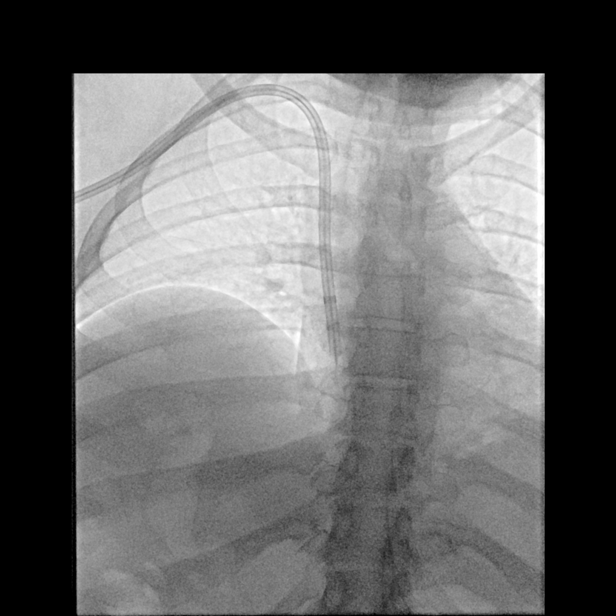

[13 of 14 positions shown; findings below may reference images not displayed]

EXAM:
Right IJ tunneled dialysis catheter exchange

12 mm venous angioplasty of the SVC for chronic catheter fibrin
sheath disruption

MEDICATIONS:
1% lidocaine local Ancef 2 g administered within 1 hour of the
procedure.

ANESTHESIA/SEDATION:
Moderate Sedation Time: None. Minutes. The patient's level of
consciousness and vital signs were monitored continuously by
radiology nursing throughout the procedure under my direct
supervision.

FLUOROSCOPY TIME:  Fluoroscopy Time: 2 minutes 0 seconds (28 mGy).

COMPLICATIONS:
None immediate.

PROCEDURE:
Informed written consent was obtained from the patient after a
thorough discussion of the procedural risks, benefits and
alternatives. All questions were addressed. Maximal Sterile Barrier
Technique was utilized including caps, mask, sterile gowns, sterile
gloves, sterile drape, hand hygiene and skin antiseptic. A timeout
was performed prior to the initiation of the procedure.

Under sterile conditions and local anesthesia, the existing tunneled
right IJ catheter was released from the subcutaneous tunnel with
sharp and blunt dissection. Catheter removed over a stiff Glidewire.
The Glidewire access advanced into the IVC. A new 19 cm tip to cuff
palindrome catheter was advanced. Tip position SVC RA junction.
Blood aspirated poorly from the blue port but easily from the red
port suspicious for fibrin sheath. Catheter was retracted into the
proximal SVC. Contrast injection performed. Limited SVC venogram
confirms fibrin sheath and nonocclusive SVC filling defect/thrombus.

Stiff Glidewire readvanced into the IVC. 12 mm mustang balloon
advanced over the guidewire. Overlapping 12 mm angioplasty performed
throughout the SVC for fibrin sheath disruption.

Palindrome catheter readvanced over the Glidewire. Tip positioned in
the proximal SVC. Repeat SVC venogram demonstrates nonocclusive
irregular SVC filling defect compatible with chronic
thrombus/disrupted fibrin sheath. There is improved SVC caliber.

Catheter tip readvanced to the SVC RA junction. At this point, both
red and blue lumens aspirated blood easily. Saline flush performed
followed by appropriate volume strength of heparin instilled in both
lumens. External caps applied. Catheter secured with Prolene suture
and a sterile dressing. No immediate complication.
IMPRESSION: Successful right IJ tunneled dialysis catheter exchange requiring 12
mm venous angioplasty of the SVC for fibrin sheath disruption as
above.
